# Patient Record
Sex: Female | Born: 1995
Health system: Southern US, Community
[De-identification: ages and names within clinical notes are randomized; demographics above are authoritative.]

## PROBLEM LIST (undated history)

## (undated) ENCOUNTER — Inpatient Hospital Stay (HOSPITAL_COMMUNITY): Payer: Self-pay

## (undated) DIAGNOSIS — A6000 Herpesviral infection of urogenital system, unspecified: Secondary | ICD-10-CM

## (undated) DIAGNOSIS — O09299 Supervision of pregnancy with other poor reproductive or obstetric history, unspecified trimester: Secondary | ICD-10-CM

## (undated) DIAGNOSIS — F1120 Opioid dependence, uncomplicated: Secondary | ICD-10-CM

## (undated) DIAGNOSIS — G8929 Other chronic pain: Secondary | ICD-10-CM

## (undated) DIAGNOSIS — N946 Dysmenorrhea, unspecified: Secondary | ICD-10-CM

## (undated) DIAGNOSIS — O99320 Drug use complicating pregnancy, unspecified trimester: Secondary | ICD-10-CM

## (undated) DIAGNOSIS — F1910 Other psychoactive substance abuse, uncomplicated: Secondary | ICD-10-CM

## (undated) HISTORY — DX: Other chronic pain: G89.29

## (undated) HISTORY — PX: TOOTH EXTRACTION: SUR596

## (undated) HISTORY — DX: Opioid dependence, uncomplicated: F11.20

## (undated) HISTORY — DX: Other psychoactive substance abuse, uncomplicated: F19.10

## (undated) HISTORY — DX: Supervision of pregnancy with other poor reproductive or obstetric history, unspecified trimester: O09.299

## (undated) HISTORY — DX: Drug use complicating pregnancy, unspecified trimester: O99.320

---
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
## 2014-01-05 ENCOUNTER — Emergency Department (HOSPITAL_COMMUNITY)
Admission: EM | Admit: 2014-01-05 | Discharge: 2014-01-05 | Disposition: A | Discharge status: Home / Self Care | Payer: Medicaid Other | Attending: Emergency Medicine | Admitting: Emergency Medicine

## 2014-01-05 ENCOUNTER — Encounter (HOSPITAL_COMMUNITY): Payer: Self-pay | Admitting: Emergency Medicine

## 2014-01-05 DIAGNOSIS — Z79899 Other long term (current) drug therapy: Secondary | ICD-10-CM | POA: Insufficient documentation

## 2014-01-05 DIAGNOSIS — S40022A Contusion of left upper arm, initial encounter: Secondary | ICD-10-CM | POA: Insufficient documentation

## 2014-01-05 DIAGNOSIS — S8012XA Contusion of left lower leg, initial encounter: Secondary | ICD-10-CM | POA: Insufficient documentation

## 2014-01-05 DIAGNOSIS — Y998 Other external cause status: Secondary | ICD-10-CM | POA: Insufficient documentation

## 2014-01-05 DIAGNOSIS — S8011XA Contusion of right lower leg, initial encounter: Secondary | ICD-10-CM | POA: Insufficient documentation

## 2014-01-05 DIAGNOSIS — J45909 Unspecified asthma, uncomplicated: Secondary | ICD-10-CM | POA: Insufficient documentation

## 2014-01-05 DIAGNOSIS — Y9389 Activity, other specified: Secondary | ICD-10-CM | POA: Insufficient documentation

## 2014-01-05 DIAGNOSIS — Y929 Unspecified place or not applicable: Secondary | ICD-10-CM | POA: Insufficient documentation

## 2014-01-05 DIAGNOSIS — Z87448 Personal history of other diseases of urinary system: Secondary | ICD-10-CM | POA: Insufficient documentation

## 2014-01-05 DIAGNOSIS — S40021A Contusion of right upper arm, initial encounter: Secondary | ICD-10-CM | POA: Insufficient documentation
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
## 2014-01-05 NOTE — ED Notes (Signed)
Patient wants to speak with [employer]. [employer] at bedside.

## 2014-01-05 NOTE — Discharge Instructions (Signed)
°Emergency Department Resource Guide °1) Find a Doctor and Pay Out of Pocket °Although you won't have to find out who is covered by your insurance plan, it is a good idea to ask around and get recommendations. You will then need to call the office and see if the doctor you have chosen will accept you as a new patient and what types of options they offer for patients who are self-pay. Some doctors offer discounts or will set up payment plans for their patients who do not have insurance, but you will need to ask so you aren't surprised when you get to your appointment. ° °2) Contact Your Local Health Department °Not all health departments have doctors that can see patients for sick visits, but many do, so it is worth a call to see if yours does. If you don't know where your local health department is, you can check in your phone book. The CDC also has a tool to help you locate your state's health department, and many state websites also have listings of all of their local health departments. ° °3) Find a Walk-in Clinic °If your illness is not likely to be very severe or complicated, you may want to try a walk in clinic. These are popping up all over the country in pharmacies, drugstores, and shopping centers. They're usually staffed by nurse practitioners or physician assistants that have been trained to treat common illnesses and complaints. They're usually fairly quick and inexpensive. However, if you have serious medical issues or chronic medical problems, these are probably not your best option. ° °No Primary Care Doctor: °- Call Health Connect at  832-8000 - they can help you locate a primary care doctor that  accepts your insurance, provides certain services, etc. °- Physician Referral Service- 1-800-533-3463 ° °Chronic Pain Problems: °Organization         Address  Phone   Notes  °[facility]  ([phone]) [phone] Patients need to be referred by their primary care doctor.  ° °Medication  Assistance: °Organization         Address  Phone   Notes  °[place] [facility] [address] °[place], [place] [address] (336) 641-8030 --Must be a resident of [place] °-- Must have NO insurance coverage whatsoever (no Medicaid/ Medicare, etc.) °-- The pt. MUST have a primary care doctor that directs their care regularly and follows them in the community °  °[facility]  (866) 331-1348   °[employer] Way  (888) 892-1162   ° °Agencies that provide inexpensive medical care: °Organization         Address  Phone   Notes  °[facility]  (336) 832-8035   °[facility]    (336) 832-7272   °[facility] [address] °[place], [place] [address] (336) 832-4777   °[facility] [address], °[place] (336) 271-4999   °[facility]    (336) 373-0678   °[facility]    (336) 272-1050   °[facility] ° [address],  Phone:  (336) 832-4444, Fax:  (336) 832-4440 Hours of Operation:  9 am - 6 pm, M-F.  Also accepts Medicaid/Medicare and self-pay.  °[facility] ° [address],  Phone: (336) [phone], Fax: (336) [phone]. Hours of Operation:  8:30 am - 5:30 pm, M-F.  Also accepts Medicaid and self-pay.  °[facility] [place] [address], [place] Phone: (336) 878-6027   °[facility] [address], [place], [place] (336)723-1848, Ext. 123 Mondays & Thursdays: 7-9 AM.  First 15 patients are seen on a first come, first serve basis. °  ° °Medicaid-accepting [place] Providers: ° °Organization         Address  Phone   Notes  °[facility] [address], [place] (336) 641-2100 Also accepts self-pay patients.  °[facility] [address], [place] ° (336) 856-9996   °[facility] [address], [place]  (336) 288-8857   °[facility] [address] [place] [address], [place] (336) 299-7000   °Veita Bland [address], [place]  ° (336) 373-1557 Only accepts [place] Access Medicaid patients after they have their name applied to their card.  ° °Self-Pay (no insurance) in [place]: ° °Organization         Address  Phone   Notes  °Sickle Cell Patients, [facility] [address], [place] (336) 832-1970   °[facility] [address], [place] (336) 832-4400   °[facility] ° [address], [place] (336) 992-4800   °[facility]/Dr. Osei-Bonsu ° [address] [place] [address], [place] or [address], [place] (336) [phone] Phone number for both [place] and [place] locations is the same.  °[facility] [address], [place] (336) 299-0000   °[facility] [address] [place] [address], [place] or [address] (336) 852-7530 °(336) 878-2260   °[facility] [address], [place] (336) 350-1642, phone; (336) 294-5005, fax Sees patients 1st and 3rd Saturday of every month.  Must not qualify for public or private insurance (i.e. Medicaid, Medicare, [place] Health Choice, Veterans' Benefits) • Household income should be no more than 200% of the poverty level •The clinic cannot treat you if you are pregnant or think you are pregnant • Sexually transmitted diseases are not treated at the clinic.  ° ° °Dental Care: °Organization         Address  Phone  Notes  °[place] [facility] [address], [place] (336) 641-6152 Accepts children up to age 21 who are enrolled in Medicaid or [place] Health Choice; pregnant women with a Medicaid card; and children who have applied for Medicaid or [place] Health Choice, but were declined, whose parents can pay a reduced fee at time of service.  °[place]  [facility] [place]  [address], [place] (336) 641-7733 Accepts children up to age 21 who are enrolled in Medicaid or [place] Health Choice; pregnant women with a Medicaid card; and children who have applied for Medicaid or  Health Choice, but were declined, whose parents can pay a reduced fee at time of service.  °[facility] ° [address], [place] (336) 641-4533 Patients are seen by appointment only. Walk-ins are not accepted. [facility] will see patients 18 years of age and older. °Monday - Tuesday (8am-5pm) °Most Wednesdays (8:30-5pm) °$30 per visit, cash only  °[facility] ° [address], [place] (336) 641-4533 Patients are seen by appointment only. Walk-ins are not accepted. [facility] will see patients 18 years of age and older. °One   Wednesday Evening (Monthly: Volunteer Based).  $30 per visit, cash only  °[facility]  (919) 537-3737 for adults; Children under age 4, call Graduate Pediatric Dentistry at ([phone]) [phone]. Children aged 4-14, please call (919) [phone] to request a pediatric application. ° Dental services are provided in all areas of dental care including fillings, crowns and bridges, complete and partial dentures, implants, gum treatment, root canals, and extractions. Preventive care is also provided. Treatment is provided to both adults and children. °Patients are selected via a lottery and there is often a waiting list. °  °[facility] [address], °[place] ° (336) 763-8833 [email or web address] °  °[facility] [address], [place], [place] (336)723-1848, Ext. 123 Second and Fourth Thursday of each month, opens at 6:30 AM; Clinic ends at 9 AM.  Patients are seen on a first-come first-served basis, and a limited number are seen during each clinic.  ° °[facility] ° [address], [place], [place] (336) [phone]    Eligibility Requirements °You must have lived in [place], [place], or [place] counties for at least the last three months. °  You cannot be eligible for state or federal sponsored healthcare insurance, including Veterans Administration, Medicaid, or Medicare. °  You generally cannot be eligible for healthcare insurance through your employer.  °  How to apply: °Eligibility screenings are held every Tuesday and Wednesday afternoon from 1:00 pm until 4:00 pm. You do not need an appointment for the interview!  °[facility] [address], [place], [place] 336-631-2330   °[place] [facility]  336-342-8273   °[place]  336-703-3100   °[facility]  336-570-6415   ° °Behavioral Health Resources in the Community: °Intensive Outpatient Programs °Organization         Address  Phone  Notes  °[place] [facility] [address], [place], [place] 336-878-6098   °[facility] [address], [place], [place] 336-832-9800   °[facility] [address], [place], [place] ° 336-882-2125   °[place] [facility] [address],  °[place], [place] 1-800-853-5163 or 336-641-4981   °Substance Abuse Resources °Organization         Address  Phone  Notes  °[facility]  336-882-2125   °[facility]  336-784-9470   °[facility]  336-285-9073   °[facility]  336-845-3988   °Residential & Outpatient Substance Abuse Program  1-800-659-3381   °Psychological Services °Organization         Address  Phone  Notes  °[facility]  336- 832-9600   °[facility]  336- 378-7881   °[place] [address], [place] 1-800-853-5163 or 336-641-4981   ° °Mobile Crisis Teams °Organization         Address  Phone  Notes  °[facility], Mobile Crisis Care Unit  1-877-626-1772   °Assertive °[facility] ° [address]  [place], [place] 336-834-9664   °Sharon DeEsch [address] °[place] 336-554-5454   ° °Self-Help/Support Groups °Organization         Address  Phone             Notes  °[facility]oc[facility] of  - variety of support groups  336- 373-1402 Call for more information  °Narcotics Anonymous (NA), [facility] [address], °[place]  2 meetings at this location  ° °  Residential Treatment Programs °Organization         Address  Phone  Notes  °[facility] [address],    °[place]  1-866-801-8205   °[facility] ° [address], [place], [place] 704-293-8524   °[facility] [address], [place] 336-845-3988 Admissions: 8am-3pm M-F  °[facility] [address]    °[place], [place] 336-841-1104   °[facility] [address], [place], [place] 336-379-7146   °[facility] [address]  °[place], [place] 336-285-9073   °[facility] - Intensive Outpatient [address], [place], [place] 336-852-3033   °[facility] ([facility] [address]  °[place], [place] 1-877-615-2722 or 336-784-9470   °[facility] ([facility]) [address] [place], [place] 336-227-7417 Accepts Medicaid  °[facility] [address]  °[place] 1-800-659-3381 Substance Abuse/Addiction Treatment  ° °[place] Behavioral Health Resources °Organization         Address  Phone  Notes  °[facility]  (888) 581-9988   °Julie Brannon, PhD [address] [place], [place]   (336) 349-5553 or (336) 951-0000   °[facility]   [address] °[place], [place] (336) 349-4454   °[facility] [address], [place], [place] (336) 342-8316 Insurance/Medicaid/sponsorship through [facility]  °[facility] [address]                                    [place], [place] (336) 342-8316 Therapy/tele-psych/case    °[facility] [address]  ° [place], [place] (336) 349-2233    °Dr. Arfeen  (336) 349-4544   °[facility] [place]  [employer] [place] [facility] 1) [address], [place] °2) [address], [place] °3)  [address], [place] (336) 349-3220 °(336) 342-7768 ° °(336) 342-8140   °[place] Child Abuse Hotline (336) 342-1394 or (336) 342-3537 (After Hours)    ° ° °
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
## 2014-05-09 ENCOUNTER — Encounter (HOSPITAL_COMMUNITY): Payer: Self-pay | Admitting: Emergency Medicine

## 2014-05-09 ENCOUNTER — Emergency Department (HOSPITAL_COMMUNITY)
Admission: EM | Admit: 2014-05-09 | Discharge: 2014-05-09 | Disposition: A | Discharge status: Home / Self Care | Payer: Medicaid Other | Attending: Emergency Medicine | Admitting: Emergency Medicine

## 2014-05-09 DIAGNOSIS — Z79899 Other long term (current) drug therapy: Secondary | ICD-10-CM | POA: Diagnosis not present

## 2014-05-09 DIAGNOSIS — Z8742 Personal history of other diseases of the female genital tract: Secondary | ICD-10-CM | POA: Diagnosis not present

## 2014-05-09 DIAGNOSIS — J45909 Unspecified asthma, uncomplicated: Secondary | ICD-10-CM | POA: Diagnosis not present

## 2014-05-09 DIAGNOSIS — N390 Urinary tract infection, site not specified: Secondary | ICD-10-CM | POA: Insufficient documentation

## 2014-05-09 DIAGNOSIS — Z3202 Encounter for pregnancy test, result negative: Secondary | ICD-10-CM | POA: Insufficient documentation

## 2014-05-09 DIAGNOSIS — B9689 Other specified bacterial agents as the cause of diseases classified elsewhere: Secondary | ICD-10-CM

## 2014-05-09 DIAGNOSIS — N760 Acute vaginitis: Secondary | ICD-10-CM | POA: Insufficient documentation

## 2014-05-09 DIAGNOSIS — R109 Unspecified abdominal pain: Secondary | ICD-10-CM | POA: Diagnosis present

## 2014-05-09 LAB — URINALYSIS, ROUTINE W REFLEX MICROSCOPIC
BILIRUBIN URINE: NEGATIVE
GLUCOSE, UA: NEGATIVE mg/dL
HGB URINE DIPSTICK: NEGATIVE
Ketones, ur: NEGATIVE mg/dL
Nitrite: NEGATIVE
PROTEIN: NEGATIVE mg/dL
Specific Gravity, Urine: 1.026 (ref 1.005–1.030)
Urobilinogen, UA: 0.2 mg/dL (ref 0.0–1.0)
pH: 6 (ref 5.0–8.0)

## 2014-05-09 LAB — URINE MICROSCOPIC-ADD ON

## 2014-05-09 LAB — PREGNANCY, URINE: Preg Test, Ur: NEGATIVE

## 2014-05-09 LAB — WET PREP, GENITAL
TRICH WET PREP: NONE SEEN
YEAST WET PREP: NONE SEEN

## 2014-05-09 MED ORDER — FLUCONAZOLE 150 MG PO TABS: 150.0000 mg | ORAL_TABLET | Freq: Every day | ORAL | Status: AC

## 2014-05-09 MED ORDER — CIPROFLOXACIN HCL 500 MG PO TABS
500.0000 mg | ORAL_TABLET | Freq: Once | ORAL | Status: AC
  Administered 2014-05-09: 500 mg via ORAL
  Filled 2014-05-09: qty 1

## 2014-05-09 MED ORDER — CIPROFLOXACIN HCL 500 MG PO TABS: 500.0000 mg | ORAL_TABLET | Freq: Two times a day (BID) | ORAL | Status: DC

## 2014-05-09 MED ORDER — METRONIDAZOLE 500 MG PO TABS
500.0000 mg | ORAL_TABLET | Freq: Once | ORAL | Status: AC
  Administered 2014-05-09: 500 mg via ORAL
  Filled 2014-05-09: qty 1

## 2014-05-09 MED ORDER — METRONIDAZOLE 500 MG PO TABS: 500.0000 mg | ORAL_TABLET | Freq: Three times a day (TID) | ORAL | Status: DC

## 2014-05-09 NOTE — ED Notes (Signed)
Pt states she has been having right flank pain for several weeks and now is having dysuria and urinary frequency  Pt states she also has an odor coming from "down there" and would like to be tested for STDs

## 2014-05-09 NOTE — ED Provider Notes (Signed)
CSN: [id number]     Arrival date & time 05/09/14  2046 History   First MD Initiated Contact with Patient 05/09/14 2057     Chief Complaint  Patient presents with  . Flank Pain      HPI  This was a evaluation of some right flank pain, and dysuria. Does have the flank pain intermittently over several weeks. Dysuria/2-3 days with urinary frequency. No hematuria. No fevers chills. Also states she "has a smell down there".  Does not use birth control.  Past Medical History  Diagnosis Date  . Asthma   . Dysmenorrhea     Tired OCPs in past X2 but reports worsening of dysmenorrhea and worsening of clots   Past Surgical History  Procedure Laterality Date  . Tooth extraction     Family History  Problem Relation Age of Onset  . Diabetes Other   . Cancer Other    History  Substance Use Topics  . Smoking status: Never Smoker   . Smokeless tobacco: Not on file  . Alcohol Use: No   OB History    No data available     Review of Systems  Constitutional: Negative for fever, chills, diaphoresis, appetite change and fatigue.  HENT: Negative for mouth sores, sore throat and trouble swallowing.   Eyes: Negative for visual disturbance.  Respiratory: Negative for cough, chest tightness, shortness of breath and wheezing.   Cardiovascular: Negative for chest pain.  Gastrointestinal: Negative for nausea, vomiting, abdominal pain, diarrhea and abdominal distention.  Endocrine: Negative for polydipsia, polyphagia and polyuria.  Genitourinary: Positive for dysuria, frequency, flank pain and vaginal discharge. Negative for hematuria.  Musculoskeletal: Negative for gait problem.  Skin: Negative for color change, pallor and rash.  Neurological: Negative for dizziness, syncope, light-headedness and headaches.  Hematological: Does not bruise/bleed easily.  Psychiatric/Behavioral: Negative for behavioral problems and confusion.      Allergies  Review of patient's allergies indicates no known  allergies.  Home Medications   Prior to Admission medications   Medication Sig Start Date End Date Taking? Authorizing Provider  albuterol (PROVENTIL HFA;VENTOLIN HFA) 108 (90 BASE) MCG/ACT inhaler Inhale 3 puffs into the lungs every 4 (four) hours as needed for wheezing. 09/29/11  Yes Marcellina Millinimothy Galey, MD  albuterol (PROVENTIL) (2.5 MG/3ML) 0.083% nebulizer solution Take 2.5 mg by nebulization every 6 (six) hours as needed for wheezing or shortness of breath.    Yes Historical Provider, MD  ibuprofen (ADVIL,MOTRIN) 200 MG tablet Take 600 mg by mouth every 6 (six) hours as needed for moderate pain.   Yes Historical Provider, MD  ciprofloxacin (CIPRO) 500 MG tablet Take 1 tablet (500 mg total) by mouth every 12 (twelve) hours. 05/09/14   Rolland PorterMark Patryk Conant, MD  fluconazole (DIFLUCAN) 150 MG tablet Take 1 tablet (150 mg total) by mouth daily. 05/09/14 05/16/14  Rolland PorterMark Benedetta Sundstrom, MD  metroNIDAZOLE (FLAGYL) 500 MG tablet Take 1 tablet (500 mg total) by mouth 3 (three) times daily. 05/09/14   Rolland PorterMark Darilyn Storbeck, MD  ondansetron (ZOFRAN ODT) 4 MG disintegrating tablet Take 1 tablet (4 mg total) by mouth every 8 (eight) hours as needed for nausea. Patient not taking: Reported on 05/09/2014 06/24/13   Jillyn LedgerJessica K Palmer, PA-C   BP 114/57 mmHg  Pulse 73  Temp(Src) 98.3 F (36.8 C) (Oral)  Resp 16  SpO2 94%  LMP 04/20/2014 (Approximate) Physical Exam  Constitutional: She is oriented to person, place, and time. She appears well-developed and well-nourished. No distress.  HENT:  Head: Normocephalic.  Eyes:  Conjunctivae are normal. Pupils are equal, round, and reactive to light. No scleral icterus.  Neck: Normal range of motion. Neck supple. No thyromegaly present.  Cardiovascular: Normal rate and regular rhythm.  Exam reveals no gallop and no friction rub.   No murmur heard. Pulmonary/Chest: Effort normal and breath sounds normal. No respiratory distress. She has no wheezes. She has no rales.  Abdominal: Soft. Bowel sounds are normal.  She exhibits no distension. There is no tenderness. There is no rebound.  Genitourinary:    Musculoskeletal: Normal range of motion.  Neurological: She is alert and oriented to person, place, and time.  Skin: Skin is warm and dry. No rash noted.  Psychiatric: She has a normal mood and affect. Her behavior is normal.    ED Course  Procedures (including critical care time) Labs Review Labs Reviewed  WET PREP, GENITAL - Abnormal; Notable for the following:    Clue Cells Wet Prep HPF POC FEW (*)    WBC, Wet Prep HPF POC FEW (*)    All other components within normal limits  URINALYSIS, ROUTINE W REFLEX MICROSCOPIC - Abnormal; Notable for the following:    APPearance CLOUDY (*)    Leukocytes, UA MODERATE (*)    All other components within normal limits  URINE MICROSCOPIC-ADD ON - Abnormal; Notable for the following:    Bacteria, UA FEW (*)    All other components within normal limits  PREGNANCY, URINE  GC/CHLAMYDIA PROBE AMP ([place])    Imaging Review No results found.   EKG Interpretation None      MDM   Final diagnoses:  UTI (lower urinary tract infection)  Bacterial vaginosis    Exam and swab suggests bacterial vaginosis. Leukocytes and bacteria in her urine. Plan is Flagyl, Cipro, single dose Diflucan.    Rolland PorterMark Eliyanah Elgersma, MD 05/09/14 2219

## 2014-05-09 NOTE — Discharge Instructions (Signed)
Vaginitis Vaginitis is an inflammation of the vagina. It can happen when the normal bacteria and yeast in the vagina grow too much. There are different types. Treatment will depend on the type you have. HOME CARE  Take all medicines as told by your doctor.  Keep your vagina area clean and dry. Avoid soap. Rinse the area with water.  Avoid washing and cleaning out the vagina (douching).  Do not use tampons or have sex (intercourse) until your treatment is done.  Wipe from front to back after going to the restroom.  Wear cotton underwear.  Avoid wearing underwear while you sleep until your vaginitis is gone.  Avoid tight pants. Avoid underwear or nylons without a cotton panel.  Take off wet clothing (such as a bathing suit) as soon as you can.  Use mild, unscented products. Avoid fabric softeners and scented:  Feminine sprays.  Laundry detergents.  Tampons.  Soaps or bubble baths.  Practice safe sex and use condoms. GET HELP RIGHT AWAY IF:   You have belly (abdominal) pain.  You have a fever or lasting symptoms for more than 2-3 days.  You have a fever and your symptoms suddenly get worse. MAKE SURE YOU:   Understand these instructions.  Will watch this condition.  Will get help right away if you are not doing well or get worse. Document Released: 03/19/2008 Document Revised: 09/15/2011 Document Reviewed: 06/03/2011 [facility] Patient Information 2015 [place], [place]. This information is not intended to replace advice given to you by your health care provider. Make sure you discuss any questions you have with your health care provider.  Bacterial Vaginosis Bacterial vaginosis is a vaginal infection that occurs when the normal balance of bacteria in the vagina is disrupted. It results from an overgrowth of certain bacteria. This is the most common vaginal infection in women of childbearing age. Treatment is important to prevent complications, especially in pregnant  women, as it can cause a premature delivery. CAUSES  Bacterial vaginosis is caused by an increase in harmful bacteria that are normally present in smaller amounts in the vagina. Several different kinds of bacteria can cause bacterial vaginosis. However, the reason that the condition develops is not fully understood. RISK FACTORS Certain activities or behaviors can put you at an increased risk of developing bacterial vaginosis, including:  Having a new sex partner or multiple sex partners.  Douching.  Using an intrauterine device (IUD) for contraception. Women do not get bacterial vaginosis from toilet seats, bedding, swimming pools, or contact with objects around them. SIGNS AND SYMPTOMS  Some women with bacterial vaginosis have no signs or symptoms. Common symptoms include:  Grey vaginal discharge.  A fishlike odor with discharge, especially after sexual intercourse.  Itching or burning of the vagina and vulva.  Burning or pain with urination. DIAGNOSIS  Your health care provider will take a medical history and examine the vagina for signs of bacterial vaginosis. A sample of vaginal fluid may be taken. Your health care provider will look at this sample under a microscope to check for bacteria and abnormal cells. A vaginal pH test may also be done.  TREATMENT  Bacterial vaginosis may be treated with antibiotic medicines. These may be given in the form of a pill or a vaginal cream. A second round of antibiotics may be prescribed if the condition comes back after treatment.  HOME CARE INSTRUCTIONS   Only take over-the-counter or prescription medicines as directed by your health care provider.  If antibiotic medicine was prescribed,  take it as directed. Make sure you finish it even if you start to feel better.  Do not have sex until treatment is completed.  Tell all sexual partners that you have a vaginal infection. They should see their health care provider and be treated if they have  problems, such as a mild rash or itching.  Practice safe sex by using condoms and only having one sex partner. SEEK MEDICAL CARE IF:   Your symptoms are not improving after 3 days of treatment.  You have increased discharge or pain.  You have a fever. MAKE SURE YOU:   Understand these instructions.  Will watch your condition.  Will get help right away if you are not doing well or get worse. FOR MORE INFORMATION  Centers for Disease Control and Prevention, Division of STD Prevention: SolutionApps.co.zawww.cdc.gov/std American Sexual Health Association (ASHA): www.ashastd.org  Document Released: 12/21/2004 Document Revised: 10/11/2012 Document Reviewed: 08/02/2012 [facility] Patient Information 2015 [place], [place]. This information is not intended to replace advice given to you by your health care provider. Make sure you discuss any questions you have with your health care provider.  Urinary Tract Infection Urinary tract infections (UTIs) can develop anywhere along your urinary tract. Your urinary tract is your body's drainage system for removing wastes and extra water. Your urinary tract includes two kidneys, two ureters, a bladder, and a urethra. Your kidneys are a pair of bean-shaped organs. Each kidney is about the size of your fist. They are located below your ribs, one on each side of your spine. CAUSES Infections are caused by microbes, which are microscopic organisms, including fungi, viruses, and bacteria. These organisms are so small that they can only be seen through a microscope. Bacteria are the microbes that most commonly cause UTIs. SYMPTOMS  Symptoms of UTIs may vary by age and gender of the patient and by the location of the infection. Symptoms in young women typically include a frequent and intense urge to urinate and a painful, burning feeling in the bladder or urethra during urination. Older women and men are more likely to be tired, shaky, and weak and have muscle aches and abdominal  pain. A fever may mean the infection is in your kidneys. Other symptoms of a kidney infection include pain in your back or sides below the ribs, nausea, and vomiting. DIAGNOSIS To diagnose a UTI, your caregiver will ask you about your symptoms. Your caregiver also will ask to provide a urine sample. The urine sample will be tested for bacteria and white blood cells. White blood cells are made by your body to help fight infection. TREATMENT  Typically, UTIs can be treated with medication. Because most UTIs are caused by a bacterial infection, they usually can be treated with the use of antibiotics. The choice of antibiotic and length of treatment depend on your symptoms and the type of bacteria causing your infection. HOME CARE INSTRUCTIONS  If you were prescribed antibiotics, take them exactly as your caregiver instructs you. Finish the medication even if you feel better after you have only taken some of the medication.  Drink enough water and fluids to keep your urine clear or pale yellow.  Avoid caffeine, tea, and carbonated beverages. They tend to irritate your bladder.  Empty your bladder often. Avoid holding urine for long periods of time.  Empty your bladder before and after sexual intercourse.  After a bowel movement, women should cleanse from front to back. Use each tissue only once. SEEK MEDICAL CARE IF:  You have back pain.  You develop a fever.  Your symptoms do not begin to resolve within 3 days. SEEK IMMEDIATE MEDICAL CARE IF:   You have severe back pain or lower abdominal pain.  You develop chills.  You have nausea or vomiting.  You have continued burning or discomfort with urination. MAKE SURE YOU:   Understand these instructions.  Will watch your condition.  Will get help right away if you are not doing well or get worse. Document Released: 09/30/2004 Document Revised: 06/22/2011 Document Reviewed: 01/29/2011 [facility] Patient Information 2015 [place],  [place]. This information is not intended to replace advice given to you by your health care provider. Make sure you discuss any questions you have with your health care provider.

## 2014-05-10 LAB — GC/CHLAMYDIA PROBE AMP (~~LOC~~) NOT AT ARMC
Chlamydia: NEGATIVE
Neisseria Gonorrhea: NEGATIVE

## 2014-05-24 ENCOUNTER — Emergency Department (HOSPITAL_COMMUNITY)
Admission: EM | Admit: 2014-05-24 | Discharge: 2014-05-24 | Disposition: A | Discharge status: Home / Self Care | Payer: Medicaid Other | Attending: Emergency Medicine | Admitting: Emergency Medicine

## 2014-05-24 ENCOUNTER — Encounter (HOSPITAL_COMMUNITY): Payer: Self-pay | Admitting: *Deleted

## 2014-05-24 DIAGNOSIS — M2550 Pain in unspecified joint: Secondary | ICD-10-CM | POA: Insufficient documentation

## 2014-05-24 DIAGNOSIS — R112 Nausea with vomiting, unspecified: Secondary | ICD-10-CM | POA: Insufficient documentation

## 2014-05-24 DIAGNOSIS — R197 Diarrhea, unspecified: Secondary | ICD-10-CM | POA: Diagnosis present

## 2014-05-24 DIAGNOSIS — Z79899 Other long term (current) drug therapy: Secondary | ICD-10-CM | POA: Insufficient documentation

## 2014-05-24 DIAGNOSIS — J45909 Unspecified asthma, uncomplicated: Secondary | ICD-10-CM | POA: Diagnosis not present

## 2014-05-24 DIAGNOSIS — Z3202 Encounter for pregnancy test, result negative: Secondary | ICD-10-CM | POA: Insufficient documentation

## 2014-05-24 DIAGNOSIS — R42 Dizziness and giddiness: Secondary | ICD-10-CM | POA: Insufficient documentation

## 2014-05-24 DIAGNOSIS — Z8742 Personal history of other diseases of the female genital tract: Secondary | ICD-10-CM | POA: Diagnosis not present

## 2014-05-24 LAB — URINE MICROSCOPIC-ADD ON

## 2014-05-24 LAB — CBC WITH DIFFERENTIAL/PLATELET
BASOS ABS: 0 10*3/uL (ref 0.0–0.1)
Basophils Relative: 0 % (ref 0–1)
EOS ABS: 0.1 10*3/uL (ref 0.0–0.7)
Eosinophils Relative: 0 % (ref 0–5)
HCT: 42.3 % (ref 36.0–46.0)
HEMOGLOBIN: 14.7 g/dL (ref 12.0–15.0)
LYMPHS ABS: 0.8 10*3/uL (ref 0.7–4.0)
Lymphocytes Relative: 4 % — ABNORMAL LOW (ref 12–46)
MCH: 33 pg (ref 26.0–34.0)
MCHC: 34.8 g/dL (ref 30.0–36.0)
MCV: 95.1 fL (ref 78.0–100.0)
MONO ABS: 1.1 10*3/uL — AB (ref 0.1–1.0)
MONOS PCT: 6 % (ref 3–12)
Neutro Abs: 16.5 10*3/uL — ABNORMAL HIGH (ref 1.7–7.7)
Neutrophils Relative %: 90 % — ABNORMAL HIGH (ref 43–77)
Platelets: 142 10*3/uL — ABNORMAL LOW (ref 150–400)
RBC: 4.45 MIL/uL (ref 3.87–5.11)
RDW: 12.2 % (ref 11.5–15.5)
WBC: 18.4 10*3/uL — ABNORMAL HIGH (ref 4.0–10.5)

## 2014-05-24 LAB — URINALYSIS, ROUTINE W REFLEX MICROSCOPIC
Bilirubin Urine: NEGATIVE
Glucose, UA: NEGATIVE mg/dL
Ketones, ur: 15 mg/dL — AB
Leukocytes, UA: NEGATIVE
NITRITE: NEGATIVE
Protein, ur: 30 mg/dL — AB
Specific Gravity, Urine: 1.037 — ABNORMAL HIGH (ref 1.005–1.030)
Urobilinogen, UA: 0.2 mg/dL (ref 0.0–1.0)
pH: 6 (ref 5.0–8.0)

## 2014-05-24 LAB — COMPREHENSIVE METABOLIC PANEL
ALT: 17 U/L (ref 14–54)
ANION GAP: 10 (ref 5–15)
AST: 18 U/L (ref 15–41)
Albumin: 3.6 g/dL (ref 3.5–5.0)
Alkaline Phosphatase: 54 U/L (ref 38–126)
BUN: 15 mg/dL (ref 6–20)
CALCIUM: 9 mg/dL (ref 8.9–10.3)
CO2: 22 mmol/L (ref 22–32)
Chloride: 101 mmol/L (ref 101–111)
Creatinine, Ser: 0.81 mg/dL (ref 0.44–1.00)
GFR calc Af Amer: >60 mL/min (ref >60)
GFR calc non Af Amer: >60 mL/min (ref >60)
Glucose, Bld: 90 mg/dL (ref 65–99)
Potassium: 3.3 mmol/L — ABNORMAL LOW (ref 3.5–5.1)
Sodium: 133 mmol/L — ABNORMAL LOW (ref 135–145)
Total Bilirubin: 0.8 mg/dL (ref 0.3–1.2)
Total Protein: 6.3 g/dL — ABNORMAL LOW (ref 6.5–8.1)

## 2014-05-24 LAB — POC URINE PREG, ED: Preg Test, Ur: NEGATIVE

## 2014-05-24 LAB — LIPASE, BLOOD: Lipase: 27 U/L (ref 22–51)

## 2014-05-24 MED ORDER — ONDANSETRON 4 MG PO TBDP
4.0000 mg | ORAL_TABLET | Freq: Once | ORAL | Status: AC
  Administered 2014-05-24: 4 mg via ORAL
  Filled 2014-05-24: qty 1

## 2014-05-24 MED ORDER — ACETAMINOPHEN 325 MG PO TABS
650.0000 mg | ORAL_TABLET | Freq: Once | ORAL | Status: AC
  Administered 2014-05-24: 650 mg via ORAL
  Filled 2014-05-24: qty 2

## 2014-05-24 MED ORDER — ONDANSETRON 4 MG PO TBDP: 4.0000 mg | ORAL_TABLET | Freq: Three times a day (TID) | ORAL | Status: DC | PRN

## 2014-05-24 NOTE — Discharge Instructions (Signed)

## 2014-05-24 NOTE — ED Notes (Signed)
Pt able to keep down ginger ale after taking zofran without difficulty

## 2014-05-24 NOTE — ED Provider Notes (Signed)
CSN: [id number]     Arrival date & time 05/24/14  1638 History   First MD Initiated Contact with Patient 05/24/14 1858     Chief Complaint  Patient presents with  . Emesis  . Diarrhea   Beth Carr is 19 y.o. female who presents the emergency department complaining of nausea, vomiting and diarrhea for the past 3 days. She also reports associated body aches. The patient reports she had one episode of vomiting earlier this morning and one episode of diarrhea this morning as well. She reports she has been eating and drinking normally since her episodes of vomiting and diarrhea this morning and currently denies any nausea. She reports that last night she stood up to use the bathroom and felt lightheaded and passed out on the couch. She denies hitting her head. She reports her symptoms resolved after lying on the couch for a few moments. The patient reports she has eaten Jacqual[employer] Mau[employer] views prior to coming to the emergency department and has been drinking fluids in the ED. The patient has not vomited since eating today. The patient denies fevers, chills, abdominal pain, hematemesis, hematochezia, vaginal bleeding, vaginal discharge, urinary symptoms, hematuria, lightheadedness today, headaches, sick contacts, neck pain or rashes.   (Consider location/radiation/quality/duration/timing/severity/associated sxs/prior Treatment) HPI  Past Medical History  Diagnosis Date  . Asthma   . Dysmenorrhea     Tired OCPs in past X2 but reports worsening of dysmenorrhea and worsening of clots   Past Surgical History  Procedure Laterality Date  . Tooth extraction     Family History  Problem Relation Age of Onset  . Diabetes Other   . Cancer Other    History  Substance Use Topics  . Smoking status: Never Smoker   . Smokeless tobacco: Not on file  . Alcohol Use: No   OB History    No data available     Review of Systems  Constitutional: Negative for fever and chills.  HENT: Negative for congestion, ear  pain, sore throat and trouble swallowing.   Eyes: Negative for pain and visual disturbance.  Respiratory: Negative for cough, shortness of breath and wheezing.   Cardiovascular: Negative for chest pain and palpitations.  Gastrointestinal: Positive for nausea, vomiting and diarrhea. Negative for abdominal pain and blood in stool.  Genitourinary: Negative for dysuria, urgency, frequency, hematuria, flank pain, vaginal bleeding, vaginal discharge, difficulty urinating and genital sores.  Musculoskeletal: Positive for arthralgias. Negative for back pain, neck pain and neck stiffness.  Skin: Negative for rash.  Neurological: Positive for light-headedness. Negative for dizziness, weakness, numbness and headaches.      Allergies  Review of patient's allergies indicates no known allergies.  Home Medications   Prior to Admission medications   Medication Sig Start Date End Date Taking? Authorizing Provider  albuterol (PROVENTIL HFA;VENTOLIN HFA) 108 (90 BASE) MCG/ACT inhaler Inhale 3 puffs into the lungs every 4 (four) hours as needed for wheezing. 09/29/11  Yes Marcellina Millinimothy Galey, MD  albuterol (PROVENTIL) (2.5 MG/3ML) 0.083% nebulizer solution Take 2.5 mg by nebulization every 6 (six) hours as needed for wheezing or shortness of breath.    Yes Historical Provider, MD  ibuprofen (ADVIL,MOTRIN) 200 MG tablet Take 600 mg by mouth every 6 (six) hours as needed for moderate pain.   Yes Historical Provider, MD  ciprofloxacin (CIPRO) 500 MG tablet Take 1 tablet (500 mg total) by mouth every 12 (twelve) hours. Patient not taking: Reported on 05/24/2014 05/09/14   Rolland PorterMark James, MD  metroNIDAZOLE (FLAGYL) 500 MG  tablet Take 1 tablet (500 mg total) by mouth 3 (three) times daily. Patient not taking: Reported on 05/24/2014 05/09/14   Rolland Porter, MD  ondansetron (ZOFRAN ODT) 4 MG disintegrating tablet Take 1 tablet (4 mg total) by mouth every 8 (eight) hours as needed for nausea or vomiting. 05/24/14   Everlene Farrier, PA-C    BP 109/60 mmHg  Pulse 75  Temp(Src) 98.4 F (36.9 C) (Oral)  Resp 20  Ht  (1.6 m)  Wt 110 lb (49.896 kg)  BMI 19.49 kg/m2  SpO2 99%  LMP 05/13/2014 Physical Exam  Constitutional: She is oriented to person, place, and time. She appears well-developed and well-nourished. No distress.  Nontoxic appearing.  HENT:  Head: Normocephalic and atraumatic.  Right Ear: External ear normal.  Left Ear: External ear normal.  Nose: Nose normal.  Mouth/Throat: Oropharynx is clear and moist. No oropharyngeal exudate.  Eyes: Conjunctivae are normal. Pupils are equal, round, and reactive to light. Right eye exhibits no discharge. Left eye exhibits no discharge.  Neck: Normal range of motion. Neck supple. No JVD present. No tracheal deviation present.  Cardiovascular: Normal rate, regular rhythm, normal heart sounds and intact distal pulses.  Exam reveals no gallop and no friction rub.   No murmur heard. Pulmonary/Chest: Effort normal and breath sounds normal. No respiratory distress. She has no wheezes. She has no rales.  Abdominal: Soft. Bowel sounds are normal. She exhibits no distension and no mass. There is no tenderness. There is no rebound and no guarding.  Abdomen is soft and nontender to palpation. Bowel sounds are present. No right lower quadrant tenderness. No rebound tenderness.  Musculoskeletal: She exhibits no edema or tenderness.  Lymphadenopathy:    She has no cervical adenopathy.  Neurological: She is alert and oriented to person, place, and time. Coordination normal.  Skin: Skin is warm and dry. No rash noted. She is not diaphoretic. No erythema. No pallor.  Psychiatric: She has a normal mood and affect. Her behavior is normal.  Nursing note and vitals reviewed.   ED Course  Procedures (including critical care time) Labs Review Labs Reviewed  CBC WITH DIFFERENTIAL/PLATELET - Abnormal; Notable for the following:    WBC 18.4 (*)    Platelets 142 (*)    Neutrophils  Relative % 90 (*)    Neutro Abs 16.5 (*)    Lymphocytes Relative 4 (*)    Monocytes Absolute 1.1 (*)    All other components within normal limits  COMPREHENSIVE METABOLIC PANEL - Abnormal; Notable for the following:    Sodium 133 (*)    Potassium 3.3 (*)    Total Protein 6.3 (*)    All other components within normal limits  URINALYSIS, ROUTINE W REFLEX MICROSCOPIC - Abnormal; Notable for the following:    Color, Urine AMBER (*)    APPearance CLOUDY (*)    Specific Gravity, Urine 1.037 (*)    Hgb urine dipstick TRACE (*)    Ketones, ur 15 (*)    Protein, ur 30 (*)    All other components within normal limits  URINE MICROSCOPIC-ADD ON - Abnormal; Notable for the following:    Squamous Epithelial / LPF MANY (*)    Bacteria, UA FEW (*)    Casts HYALINE CASTS (*)    All other components within normal limits  LIPASE, BLOOD  POC URINE PREG, ED    Imaging Review No results found.   EKG Interpretation None      Filed Vitals:   05/24/14  1828 05/24/14 1900 05/24/14 2033 05/24/14 2100  BP: 112/59 113/66 78/61 109/60  Pulse: 105 87 88 75  Temp:      TempSrc:      Resp: 20  20   Height:      Weight:      SpO2: 100% 100% 99% 99%     MDM   Meds given in ED:  Medications  ondansetron (ZOFRAN-ODT) disintegrating tablet 4 mg (4 mg Oral Given 05/24/14 2001)  acetaminophen (TYLENOL) tablet 650 mg (650 mg Oral Given 05/24/14 2001)    New Prescriptions   ONDANSETRON (ZOFRAN ODT) 4 MG DISINTEGRATING TABLET    Take 1 tablet (4 mg total) by mouth every 8 (eight) hours as needed for nausea or vomiting.    Final diagnoses:  Nausea vomiting and diarrhea   This is a 19 year old female who presented to the emergency department complaining of nausea, vomiting and diarrhea for the past 2 days associated with generalized body aches. She reports she last had an episode of nausea and vomiting this morning. She reports less than a loose stool this morning. She reports she has been eating and  drinking normally this afternoon and has not had any vomiting or diarrhea. On exam the patient is afebrile and nontoxic appearing. Her abdomen is soft and nontender to palpation. She has no peritoneal signs. She is drinking fluids in the ED. She reports that she is feeling better currently. Patient provided with Zofran and Tylenol for her body aches. Patient tolerated ginger ale and crackers in the emergency department without nausea or vomiting. She has a negative urine pregnancy test. Her urinalysis is nitrite and leukocyte negative. She denies any urinary symptoms. Patient does have a mild acidosis with a white count of 18.4. Her CMP is unremarkable. Her lipase is within normal limits. At reevaluation the patient reports feeling much better and ready to discharge. She is tolerated by mouth liquids in the DP. Her abdomen is still soft and nontender. Will discharge with prescriptions for Zofran and advised to follow-up with her PCP. Strict return precautions provided. I advised the patient to follow-up with their primary care provider this week. I advised the patient to return to the emergency department with new or worsening symptoms or new concerns. The patient verbalized understanding and agreement with plan.       Everlene FarrierWilliam Dail Lerew, PA-C 05/24/14 2135  Doug SouSam Jacubowitz, MD 05/25/14 [phone]

## 2014-05-24 NOTE — ED Notes (Signed)
Pt reports having n/v/d x 3 days. Having bodyaches and fatigue, reports syncopal episode last night. No acute distress noted at triage.

## 2014-10-25 ENCOUNTER — Encounter (HOSPITAL_COMMUNITY): Payer: Self-pay | Admitting: Emergency Medicine

## 2014-10-25 ENCOUNTER — Emergency Department (HOSPITAL_COMMUNITY)
Admission: EM | Admit: 2014-10-25 | Discharge: 2014-10-25 | Disposition: A | Discharge status: Home / Self Care | Payer: Medicaid Other | Attending: Emergency Medicine | Admitting: Emergency Medicine

## 2014-10-25 DIAGNOSIS — R102 Pelvic and perineal pain: Secondary | ICD-10-CM | POA: Diagnosis present

## 2014-10-25 DIAGNOSIS — B373 Candidiasis of vulva and vagina: Secondary | ICD-10-CM | POA: Insufficient documentation

## 2014-10-25 DIAGNOSIS — J45909 Unspecified asthma, uncomplicated: Secondary | ICD-10-CM | POA: Diagnosis not present

## 2014-10-25 DIAGNOSIS — Z79899 Other long term (current) drug therapy: Secondary | ICD-10-CM | POA: Insufficient documentation

## 2014-10-25 DIAGNOSIS — B3731 Acute candidiasis of vulva and vagina: Secondary | ICD-10-CM

## 2014-10-25 DIAGNOSIS — Z3202 Encounter for pregnancy test, result negative: Secondary | ICD-10-CM | POA: Insufficient documentation

## 2014-10-25 DIAGNOSIS — Z202 Contact with and (suspected) exposure to infections with a predominantly sexual mode of transmission: Secondary | ICD-10-CM | POA: Insufficient documentation

## 2014-10-25 DIAGNOSIS — K644 Residual hemorrhoidal skin tags: Secondary | ICD-10-CM | POA: Diagnosis not present

## 2014-10-25 DIAGNOSIS — Z8742 Personal history of other diseases of the female genital tract: Secondary | ICD-10-CM | POA: Diagnosis not present

## 2014-10-25 DIAGNOSIS — Z711 Person with feared health complaint in whom no diagnosis is made: Secondary | ICD-10-CM

## 2014-10-25 LAB — URINE MICROSCOPIC-ADD ON

## 2014-10-25 LAB — URINALYSIS, ROUTINE W REFLEX MICROSCOPIC
Bilirubin Urine: NEGATIVE
GLUCOSE, UA: NEGATIVE mg/dL
Hgb urine dipstick: NEGATIVE
Ketones, ur: NEGATIVE mg/dL
NITRITE: NEGATIVE
PROTEIN: NEGATIVE mg/dL
Specific Gravity, Urine: 1.024 (ref 1.005–1.030)
Urobilinogen, UA: 1 mg/dL (ref 0.0–1.0)
pH: 7.5 (ref 5.0–8.0)

## 2014-10-25 LAB — POC URINE PREG, ED: PREG TEST UR: NEGATIVE

## 2014-10-25 LAB — WET PREP, GENITAL
Clue Cells Wet Prep HPF POC: NONE SEEN
TRICH WET PREP: NONE SEEN

## 2014-10-25 MED ORDER — FLUCONAZOLE 150 MG PO TABS
150.0000 mg | ORAL_TABLET | Freq: Once | ORAL | Status: DC
  Filled 2014-10-25: qty 1

## 2014-10-25 MED ORDER — LIDOCAINE HCL 1 % IJ SOLN
INTRAMUSCULAR | Status: AC
  Administered 2014-10-25: 20 mL
  Filled 2014-10-25: qty 20

## 2014-10-25 MED ORDER — AZITHROMYCIN 250 MG PO TABS
1000.0000 mg | ORAL_TABLET | Freq: Once | ORAL | Status: AC
  Administered 2014-10-25: 1000 mg via ORAL
  Filled 2014-10-25: qty 4

## 2014-10-25 MED ORDER — FLUCONAZOLE 150 MG PO TABS: 150.0000 mg | ORAL_TABLET | Freq: Once | ORAL | Status: DC

## 2014-10-25 MED ORDER — CEFTRIAXONE SODIUM 250 MG IJ SOLR
250.0000 mg | Freq: Once | INTRAMUSCULAR | Status: AC
  Administered 2014-10-25: 250 mg via INTRAMUSCULAR
  Filled 2014-10-25: qty 250

## 2014-10-25 NOTE — ED Notes (Signed)
Pt left before receiving PO diflucan and d/c instructions.

## 2014-10-25 NOTE — Progress Notes (Signed)
19 yr old female from [place]([place]) Pt states address in [place] is her "permanent address but I moved to [place] about a year ago"  "address is for the person who adopted me"   Pt given [place] uninsured and [place] resources Pt is not eligible for [facility] because of her preference to keep [place] as permanent address Encouraged use of goodrx to help with medication choices/cost   CM discussed and provided written information for uninsured accepting pcps, discussed the importance of pcp vs EDP services for f/u care, www.needymeds.org, www.goodrx.com, discounted pharmacies and other [place] resources such as [facility] , [facility], affordable care act, financial assistance, uninsured dental services,  med assist, DSS and  health department  Reviewed resources for [place] uninsured accepting pcps like [facility], family medicine [facility] [address], [facility], [facility], local urgent care centers, [facility], [facility], general medical clinics, [facility] services of the [facility], [facility] urgent care plus others, medication resources, [facility] out patient [facility]ies and housing Pt voiced understanding and appreciation of resources provided

## 2014-10-25 NOTE — ED Notes (Addendum)
Patient c/o bilateral low abd/pelvic pain x 1 week. Denies N/V/D. Last BM 10/24/2014. Denies difficulty with voiding. Patient thinks she may have a yeast infection, states she has itching and burning after urination.

## 2014-10-25 NOTE — ED Notes (Signed)
Pt and patients belongings not in room.

## 2014-10-25 NOTE — Discharge Instructions (Signed)
Take diflucan in 48 hours. Follow up with gynecology.  Vaginitis Vaginitis is an inflammation of the vagina. It is most often caused by a change in the normal balance of the bacteria and yeast that live in the vagina. This change in balance causes an overgrowth of certain bacteria or yeast, which causes the inflammation. There are different types of vaginitis, but the most common types are:  Bacterial vaginosis.  Yeast infection (candidiasis).  Trichomoniasis vaginitis. This is a sexually transmitted infection (STI).  Viral vaginitis.  Atrophic vaginitis.  Allergic vaginitis. CAUSES  The cause depends on the type of vaginitis. Vaginitis can be caused by:  Bacteria (bacterial vaginosis).  Yeast (yeast infection).  A parasite (trichomoniasis vaginitis)  A virus (viral vaginitis).  Low hormone levels (atrophic vaginitis). Low hormone levels can occur during pregnancy, breastfeeding, or after menopause.  Irritants, such as bubble baths, scented tampons, and feminine sprays (allergic vaginitis). Other factors can change the normal balance of the yeast and bacteria that live in the vagina. These include:  Antibiotic medicines.  Poor hygiene.  Diaphragms, vaginal sponges, spermicides, birth control pills, and intrauterine devices (IUD).  Sexual intercourse.  Infection.  Uncontrolled diabetes.  A weakened immune system. SYMPTOMS  Symptoms can vary depending on the cause of the vaginitis. Common symptoms include:  Abnormal vaginal discharge.  The discharge is white, gray, or yellow with bacterial vaginosis.  The discharge is thick, white, and cheesy with a yeast infection.  The discharge is frothy and yellow or greenish with trichomoniasis.  A bad vaginal odor.  The odor is fishy with bacterial vaginosis.  Vaginal itching, pain, or swelling.  Painful intercourse.  Pain or burning when urinating. Sometimes, there are no symptoms. TREATMENT  Treatment will vary  depending on the type of infection.   Bacterial vaginosis and trichomoniasis are often treated with antibiotic creams or pills.  Yeast infections are often treated with antifungal medicines, such as vaginal creams or suppositories.  Viral vaginitis has no cure, but symptoms can be treated with medicines that relieve discomfort. Your sexual partner should be treated as well.  Atrophic vaginitis may be treated with an estrogen cream, pill, suppository, or vaginal ring. If vaginal dryness occurs, lubricants and moisturizing creams may help. You may be told to avoid scented soaps, sprays, or douches.  Allergic vaginitis treatment involves quitting the use of the product that is causing the problem. Vaginal creams can be used to treat the symptoms. HOME CARE INSTRUCTIONS   Take all medicines as directed by your caregiver.  Keep your genital area clean and dry. Avoid soap and only rinse the area with water.  Avoid douching. It can remove the healthy bacteria in the vagina.  Do not use tampons or have sexual intercourse until your vaginitis has been treated. Use sanitary pads while you have vaginitis.  Wipe from front to back. This avoids the spread of bacteria from the rectum to the vagina.  Let air reach your genital area.  Wear cotton underwear to decrease moisture buildup.  Avoid wearing underwear while you sleep until your vaginitis is gone.  Avoid tight pants and underwear or nylons without a cotton panel.  Take off wet clothing (especially bathing suits) as soon as possible.  Use mild, non-scented products. Avoid using irritants, such as:  Scented feminine sprays.  Fabric softeners.  Scented detergents.  Scented tampons.  Scented soaps or bubble baths.  Practice safe sex and use condoms. Condoms may prevent the spread of trichomoniasis and viral vaginitis. SEEK MEDICAL  CARE IF:   You have abdominal pain.  You have a fever or persistent symptoms for more than 2-3  days.  You have a fever and your symptoms suddenly get worse.   This information is not intended to replace advice given to you by your health care provider. Make sure you discuss any questions you have with your health care provider.   Document Released: 10/18/2006 Document Revised: 05/07/2014 Document Reviewed: 06/03/2011 Elsevier Interactive Patient Education 2016 [employer]  Sexually Transmitted Disease A sexually transmitted disease (STD) is a disease or infection that may be passed (transmitted) from person to person, usually during sexual activity. This may happen by way of saliva, semen, blood, vaginal mucus, or urine. Common STDs include:  Gonorrhea.  Chlamydia.  Syphilis.  HIV and AIDS.  Genital herpes.  Hepatitis B and C.  Trichomonas.  Human papillomavirus (HPV).  Pubic lice.  Scabies.  Mites.  Bacterial vaginosis. WHAT ARE CAUSES OF STDs? An STD may be caused by bacteria, a virus, or parasites. STDs are often transmitted during sexual activity if one person is infected. However, they may also be transmitted through nonsexual means. STDs may be transmitted after:   Sexual intercourse with an infected person.  Sharing sex toys with an infected person.  Sharing needles with an infected person or using unclean piercing or tattoo needles.  Having intimate contact with the genitals, mouth, or rectal areas of an infected person.  Exposure to infected fluids during birth. WHAT ARE THE SIGNS AND SYMPTOMS OF STDs? Different STDs have different symptoms. Some people may not have any symptoms. If symptoms are present, they may include:  Painful or bloody urination.  Pain in the pelvis, abdomen, vagina, anus, throat, or eyes.  A skin rash, itching, or irritation.  Growths, ulcerations, blisters, or sores in the genital and anal areas.  Abnormal vaginal discharge with or without bad odor.  Penile discharge in men.  Fever.  Pain or bleeding during  sexual intercourse.  Swollen glands in the groin area.  Yellow skin and eyes (jaundice). This is seen with hepatitis.  Swollen testicles.  Infertility.  Sores and blisters in the mouth. HOW ARE STDs DIAGNOSED? To make a diagnosis, your health care provider may:  Take a medical history.  Perform a physical exam.  Take a sample of any discharge to examine.  Swab the throat, cervix, opening to the penis, rectum, or vagina for testing.  Test a sample of your first morning urine.  Perform blood tests.  Perform a Pap test, if this applies.  Perform a colposcopy.  Perform a laparoscopy. HOW ARE STDs TREATED? Treatment depends on the STD. Some STDs may be treated but not cured.  Chlamydia, gonorrhea, trichomonas, and syphilis can be cured with antibiotic medicine.  Genital herpes, hepatitis, and HIV can be treated, but not cured, with prescribed medicines. The medicines lessen symptoms.  Genital warts from HPV can be treated with medicine or by freezing, burning (electrocautery), or surgery. Warts may come back.  HPV cannot be cured with medicine or surgery. However, abnormal areas may be removed from the cervix, vagina, or vulva.  If your diagnosis is confirmed, your recent sexual partners need treatment. This is true even if they are symptom-free or have a negative culture or evaluation. They should not have sex until their health care providers say it is okay.  Your health care provider may test you for infection again 3 months after treatment. HOW CAN I REDUCE MY RISK OF GETTING AN STD?  Take these steps to reduce your risk of getting an STD:  Use latex condoms, dental dams, and water-soluble lubricants during sexual activity. Do not use petroleum jelly or oils.  Avoid having multiple sex partners.  Do not have sex with someone who has other sex partners  Do not have sex with anyone you do not know or who is at high risk for an STD.  Avoid risky sex practices that  can break your skin.  Do not have sex if you have open sores on your mouth or skin.  Avoid drinking too much alcohol or taking illegal drugs. Alcohol and drugs can affect your judgment and put you in a vulnerable position.  Avoid engaging in oral and anal sex acts.  Get vaccinated for HPV and hepatitis. If you have not received these vaccines in the past, talk to your health care provider about whether one or both might be right for you.  If you are at risk of being infected with HIV, it is recommended that you take a prescription medicine daily to prevent HIV infection. This is called pre-exposure prophylaxis (PrEP). You are considered at risk if:  You are a man who has sex with other men (MSM).  You are a heterosexual man or woman and are sexually active with more than one partner.  You take drugs by injection.  You are sexually active with a partner who has HIV.  Talk with your health care provider about whether you are at high risk of being infected with HIV. If you choose to begin PrEP, you should first be tested for HIV. You should then be tested every 3 months for as long as you are taking PrEP. WHAT SHOULD I DO IF I THINK I HAVE AN STD?  See your health care provider.  Tell your sexual partner(s). They should be tested and treated for any STDs.  Do not have sex until your health care provider says it is okay. WHEN SHOULD I GET IMMEDIATE MEDICAL CARE? Contact your health care provider right away if:   You have severe abdominal pain.  You are a man and notice swelling or pain in your testicles.  You are a woman and notice swelling or pain in your vagina.   This information is not intended to replace advice given to you by your health care provider. Make sure you discuss any questions you have with your health care provider.   Document Released: 03/13/2002 Document Revised: 01/11/2014 Document Reviewed: 07/11/2012 Elsevier Interactive Patient Education [employer].

## 2014-10-25 NOTE — ED Provider Notes (Signed)
CSN: [id number]     Arrival date & time 10/25/14  [id number] History   First MD Initiated Contact with Patient 10/25/14 1007     Chief Complaint  Patient presents with  . Pelvic Pain     (Consider location/radiation/quality/duration/timing/severity/associated sxs/prior Treatment) HPI Comments: 19 year old female presenting with vaginal itching 1 week. Reports history of multiple yeast infections due to her "abnormal formed vagina where my urethra sticks out" and after menses, and this feels similar. She noticed a skin tag lesion on her vagina that is not painful. Reports a small amount of white vaginal discharge. Has vaginal burning after urination. LMP 10/16/14. Sexually active with her boyfriend who was cheating on her and does not use protection. She is concerned about sexually transmitted diseases. Yesterday she started to develop lower abdominal and pelvic pain that is cramping in nature. Denies fever, chills, nausea or vomiting.  Patient is a 19 y.o. female presenting with vaginal itching. The history is provided by the patient and a relative.  Vaginal Itching This is a recurrent problem. The current episode started in the past 7 days. The problem occurs constantly. The problem has been gradually worsening. Associated symptoms include abdominal pain. Exacerbated by: urinating. Treatments tried: monistat. The treatment provided no relief.    Past Medical History  Diagnosis Date  . Asthma   . Dysmenorrhea     Tired OCPs in past X2 but reports worsening of dysmenorrhea and worsening of clots   Past Surgical History  Procedure Laterality Date  . Tooth extraction     Family History  Problem Relation Age of Onset  . Diabetes Other   . Cancer Other    Social History  Substance Use Topics  . Smoking status: Never Smoker   . Smokeless tobacco: None  . Alcohol Use: No   OB History    No data available     Review of Systems  Gastrointestinal: Positive for abdominal pain.   Genitourinary: Positive for vaginal discharge and pelvic pain.       +Vaginal itching.  All other systems reviewed and are negative.     Allergies  Review of patient's allergies indicates no known allergies.  Home Medications   Prior to Admission medications   Medication Sig Start Date End Date Taking? Authorizing Provider  albuterol (PROVENTIL HFA;VENTOLIN HFA) 108 (90 BASE) MCG/ACT inhaler Inhale 3 puffs into the lungs every 4 (four) hours as needed for wheezing. 09/29/11  Yes Marcellina Millinimothy Galey, MD  albuterol (PROVENTIL) (2.5 MG/3ML) 0.083% nebulizer solution Take 2.5 mg by nebulization every 6 (six) hours as needed for wheezing or shortness of breath.    Yes Historical Provider, MD  ibuprofen (ADVIL,MOTRIN) 200 MG tablet Take 600 mg by mouth every 6 (six) hours as needed for moderate pain.   Yes Historical Provider, MD  ondansetron (ZOFRAN ODT) 4 MG disintegrating tablet Take 1 tablet (4 mg total) by mouth every 8 (eight) hours as needed for nausea or vomiting. 05/24/14  Yes Everlene FarrierWilliam Dansie, PA-C  ciprofloxacin (CIPRO) 500 MG tablet Take 1 tablet (500 mg total) by mouth every 12 (twelve) hours. Patient not taking: Reported on 05/24/2014 05/09/14   Rolland PorterMark James, MD  fluconazole (DIFLUCAN) 150 MG tablet Take 1 tablet (150 mg total) by mouth once. 10/25/14   Shar Paez M Laylonie Marzec, PA-C  metroNIDAZOLE (FLAGYL) 500 MG tablet Take 1 tablet (500 mg total) by mouth 3 (three) times daily. Patient not taking: Reported on 05/24/2014 05/09/14   Rolland PorterMark James, MD   BP 139/86 mmHg  Pulse 73  Temp(Src) 98 F (36.7 C) (Oral)  Resp 14  SpO2 100%  LMP 10/16/2014 (Approximate) Physical Exam  Constitutional: She is oriented to person, place, and time. She appears well-developed and well-nourished. No distress.  HENT:  Head: Normocephalic and atraumatic.  Mouth/Throat: Oropharynx is clear and moist.  Eyes: Conjunctivae and EOM are normal.  Neck: Normal range of motion. Neck supple.  Cardiovascular: Normal rate, regular  rhythm and normal heart sounds.   Pulmonary/Chest: Effort normal and breath sounds normal. No respiratory distress.  Genitourinary: Rectal exam shows external hemorrhoid (small, no thrombosis or bleeding, no tenderness). There is no lesion on the right labia. There is no lesion on the left labia. Uterus is tender (mild "fullness" feeling). Cervix exhibits no motion tenderness, no discharge and no friability. Right adnexum displays no mass, no tenderness and no fullness. Left adnexum displays no mass, no tenderness and no fullness. Vaginal discharge (white, clumpy) found.  No external or internal vaginal lesions noted.  Musculoskeletal: Normal range of motion. She exhibits no edema.  Neurological: She is alert and oriented to person, place, and time. No sensory deficit.  Skin: Skin is warm and dry.  Psychiatric: She has a normal mood and affect. Her behavior is normal.  Nursing note and vitals reviewed.   ED Course  Procedures (including critical care time) Labs Review Labs Reviewed  WET PREP, GENITAL - Abnormal; Notable for the following:    Yeast Wet Prep HPF POC FEW (*)    WBC, Wet Prep HPF POC FEW (*)    All other components within normal limits  URINALYSIS, ROUTINE W REFLEX MICROSCOPIC (NOT AT [facility]) - Abnormal; Notable for the following:    APPearance CLOUDY (*)    Leukocytes, UA SMALL (*)    All other components within normal limits  URINE MICROSCOPIC-ADD ON - Abnormal; Notable for the following:    Bacteria, UA FEW (*)    Crystals CA OXALATE CRYSTALS (*)    All other components within normal limits  RPR  HIV ANTIBODY (ROUTINE TESTING)  POC URINE PREG, ED  GC/CHLAMYDIA PROBE AMP ([place]) NOT AT [facility]    Imaging Review No results found. I have personally reviewed and evaluated these images and lab results as part of my medical decision-making.   EKG Interpretation None      MDM   Final diagnoses:  Yeast vaginitis  Concern about STD in female without diagnosis    Non-toxic appearing, NAD. AFVSS. Abdomen soft with no peritoneal signs. Mild tenderness "fullness" to uterus. No CMT or adnexal tenderness. No cervical discharge or friability. Doubt PID. Prophylactically treated with Rocephin and azithromycin. Diflucan tablet given here and another one at discharge for 48 hours. No lesions noted. Discussed safe sexual practices advised her to follow-up with gynecology. Resources given. Stable for discharge. Return precautions given. Pt/family/caregiver aware medical decision making process and agreeable with plan.  Kathrynn Speed, PA-C 10/25/14 1151  Lyndal Pulley, MD 10/26/14 [phone]

## 2014-10-26 LAB — HIV ANTIBODY (ROUTINE TESTING W REFLEX): HIV Screen 4th Generation wRfx: NONREACTIVE

## 2014-10-26 LAB — RPR: RPR Ser Ql: NONREACTIVE

## 2014-10-28 LAB — GC/CHLAMYDIA PROBE AMP (~~LOC~~) NOT AT ARMC
Chlamydia: NEGATIVE
Neisseria Gonorrhea: NEGATIVE

## 2014-12-01 ENCOUNTER — Encounter (HOSPITAL_COMMUNITY): Payer: Self-pay | Admitting: Emergency Medicine

## 2014-12-01 ENCOUNTER — Emergency Department (HOSPITAL_COMMUNITY)
Admission: EM | Admit: 2014-12-01 | Discharge: 2014-12-01 | Disposition: A | Discharge status: Home / Self Care | Payer: Medicaid Other | Attending: Emergency Medicine | Admitting: Emergency Medicine

## 2014-12-01 DIAGNOSIS — R300 Dysuria: Secondary | ICD-10-CM | POA: Insufficient documentation

## 2014-12-01 DIAGNOSIS — J45909 Unspecified asthma, uncomplicated: Secondary | ICD-10-CM | POA: Diagnosis not present

## 2014-12-01 DIAGNOSIS — Z79899 Other long term (current) drug therapy: Secondary | ICD-10-CM | POA: Diagnosis not present

## 2014-12-01 DIAGNOSIS — Z202 Contact with and (suspected) exposure to infections with a predominantly sexual mode of transmission: Secondary | ICD-10-CM | POA: Diagnosis not present

## 2014-12-01 DIAGNOSIS — R102 Pelvic and perineal pain: Secondary | ICD-10-CM | POA: Diagnosis present

## 2014-12-01 DIAGNOSIS — Z3202 Encounter for pregnancy test, result negative: Secondary | ICD-10-CM | POA: Insufficient documentation

## 2014-12-01 DIAGNOSIS — Z8742 Personal history of other diseases of the female genital tract: Secondary | ICD-10-CM | POA: Insufficient documentation

## 2014-12-01 DIAGNOSIS — Z711 Person with feared health complaint in whom no diagnosis is made: Secondary | ICD-10-CM

## 2014-12-01 LAB — URINALYSIS, ROUTINE W REFLEX MICROSCOPIC
Bilirubin Urine: NEGATIVE
Glucose, UA: NEGATIVE mg/dL
Ketones, ur: NEGATIVE mg/dL
Nitrite: NEGATIVE
Protein, ur: NEGATIVE mg/dL
Specific Gravity, Urine: 1.017 (ref 1.005–1.030)
pH: 7 (ref 5.0–8.0)

## 2014-12-01 LAB — POC URINE PREG, ED: Preg Test, Ur: NEGATIVE

## 2014-12-01 LAB — WET PREP, GENITAL
Clue Cells Wet Prep HPF POC: NONE SEEN
Sperm: NONE SEEN
Trich, Wet Prep: NONE SEEN
Yeast Wet Prep HPF POC: NONE SEEN

## 2014-12-01 LAB — URINE MICROSCOPIC-ADD ON

## 2014-12-01 MED ORDER — ACYCLOVIR 200 MG PO CAPS: 200.0000 mg | ORAL_CAPSULE | Freq: Every day | ORAL | Status: AC

## 2014-12-01 MED ORDER — CEPHALEXIN 500 MG PO CAPS: 500.0000 mg | ORAL_CAPSULE | Freq: Four times a day (QID) | ORAL | Status: DC

## 2014-12-01 MED ORDER — LIDOCAINE HCL (PF) 1 % IJ SOLN
INTRAMUSCULAR | Status: AC
  Administered 2014-12-01: 2.5 mL
  Filled 2014-12-01: qty 15

## 2014-12-01 MED ORDER — CEFTRIAXONE SODIUM 250 MG IJ SOLR
250.0000 mg | Freq: Once | INTRAMUSCULAR | Status: AC
Start: 2014-12-01 — End: 2014-12-01
  Administered 2014-12-01: 250 mg via INTRAMUSCULAR
  Filled 2014-12-01: qty 250

## 2014-12-01 MED ORDER — ACYCLOVIR 200 MG PO CAPS: 200.0000 mg | ORAL_CAPSULE | Freq: Every day | ORAL | Status: DC

## 2014-12-01 MED ORDER — AZITHROMYCIN 250 MG PO TABS
1000.0000 mg | ORAL_TABLET | Freq: Once | ORAL | Status: AC
  Administered 2014-12-01: 1000 mg via ORAL
  Filled 2014-12-01: qty 4

## 2014-12-01 NOTE — ED Notes (Signed)
Pt states she was here "a while ago to get tested for STDs because she thought her boyfriend cheated".  Pt states that she didn't get a call so she guesses everything is ok.  Pt states that she noticed a "bump" down there and states that she has had pain when she urinates.  States that her aunt got a "zpack for a cold".  States that she googled that zpack treated UTI so she decided to take some of her aunt's abx but she doesn't feel any better.

## 2014-12-01 NOTE — ED Provider Notes (Signed)
CSN: [id number]     Arrival date & time 12/01/14  1315 History  By signing my name below, I, Beth Carr, attest that this documentation has been prepared under the direction and in the presence of Marlon Pel, PA-C.  Electronically Signed: Murriel Carr, ED Scribe. 12/01/2014. 5:30 PM.    Chief Complaint  Patient presents with  . Vaginal pain   . Dysuria      The history is provided by the patient. No language interpreter was used.   HPI Comments: Beth Carr is a 19 y.o. female who presents to the Emergency Department complaining of constant dysuria with associated infections to her skin that are present around the outside of her vagina that have been present for around a week. Pt states she came into ED to get tested for STD's a month ago because she thought her boyfriend cheated on her, and states she never received a call about any positive results. Pt states she had a red, itching bump on the outside of her vagina pop up around a week ago, and states two or three more appeared in the past couple of days. Pt states she has been using Lotrimin Ultra on the area with no relief. Pt requests to be tested for herpes today because she had her aunt look at it and thinks that's what it may be. No fevers, abdominal pains, diffuse rash to body, vaginal discharge. She has had "dysuria" pain to the rash when she urinates.   Past Medical History  Diagnosis Date  . Asthma   . Dysmenorrhea     Tired OCPs in past X2 but reports worsening of dysmenorrhea and worsening of clots   Past Surgical History  Procedure Laterality Date  . Tooth extraction     Family History  Problem Relation Age of Onset  . Diabetes Other   . Cancer Other    Social History  Substance Use Topics  . Smoking status: Never Smoker   . Smokeless tobacco: None  . Alcohol Use: No   OB History    No data available     Review of Systems  Genitourinary: Positive for dysuria.  Skin: Positive for color change  and rash.  All other systems reviewed and are negative.     Allergies  Review of patient's allergies indicates no known allergies.  Home Medications   Prior to Admission medications   Medication Sig Start Date End Date Taking? Authorizing Provider  albuterol (PROVENTIL HFA;VENTOLIN HFA) 108 (90 BASE) MCG/ACT inhaler Inhale 3 puffs into the lungs every 4 (four) hours as needed for wheezing. 09/29/11  Yes Marcellina Millin, MD  ibuprofen (ADVIL,MOTRIN) 200 MG tablet Take 800 mg by mouth every 6 (six) hours as needed for moderate pain.    Yes Historical Provider, MD  acyclovir (ZOVIRAX) 200 MG capsule Take 1 capsule (200 mg total) by mouth 5 (five) times daily. 12/01/14 12/10/14  Marlon Pel, PA-C  ciprofloxacin (CIPRO) 500 MG tablet Take 1 tablet (500 mg total) by mouth every 12 (twelve) hours. Patient not taking: Reported on 05/24/2014 05/09/14   Rolland Porter, MD  fluconazole (DIFLUCAN) 150 MG tablet Take 1 tablet (150 mg total) by mouth once. Patient not taking: Reported on 12/01/2014 10/25/14   Kathrynn Speed, PA-C  metroNIDAZOLE (FLAGYL) 500 MG tablet Take 1 tablet (500 mg total) by mouth 3 (three) times daily. Patient not taking: Reported on 05/24/2014 05/09/14   Rolland Porter, MD  ondansetron (ZOFRAN ODT) 4 MG disintegrating tablet Take 1 tablet (  4 mg total) by mouth every 8 (eight) hours as needed for nausea or vomiting. Patient not taking: Reported on 12/01/2014 05/24/14   Everlene FarrierWilliam Dansie, PA-C   BP 121/75 mmHg  Pulse 79  Temp(Src) 98 F (36.7 C) (Oral)  Resp 18  SpO2 98% Physical Exam  Constitutional: She is oriented to person, place, and time. She appears well-developed and well-nourished.  HENT:  Head: Normocephalic and atraumatic.  Cardiovascular: Normal rate.   Pulmonary/Chest: Effort normal.  Abdominal: Bowel sounds are normal. She exhibits no distension. There is no tenderness. There is no rebound, no guarding and no CVA tenderness.  Genitourinary: Uterus normal.    There is no  tenderness on the right labia. There is rash on the left labia. There is no tenderness on the left labia. Cervix exhibits no motion tenderness, no discharge and no friability. No tenderness or bleeding in the vagina. No foreign body around the vagina. No vaginal discharge found.  Neurological: She is alert and oriented to person, place, and time.  Skin: Skin is warm and dry.  Psychiatric: She has a normal mood and affect.  Nursing note and vitals reviewed.   ED Course  Procedures (including critical care time)  DIAGNOSTIC STUDIES: Oxygen Saturation is 98% on room air, normal by my interpretation.    COORDINATION OF CARE: 4:22 PM Discussed treatment plan with pt at bedside and pt agreed to plan.   Labs Review Labs Reviewed  WET PREP, GENITAL - Abnormal; Notable for the following:    WBC, Wet Prep HPF POC MANY (*)    All other components within normal limits  URINALYSIS, ROUTINE W REFLEX MICROSCOPIC (NOT AT [facility]) - Abnormal; Notable for the following:    APPearance HAZY (*)    Hgb urine dipstick TRACE (*)    Leukocytes, UA MODERATE (*)    All other components within normal limits  URINE MICROSCOPIC-ADD ON - Abnormal; Notable for the following:    Squamous Epithelial / LPF 6-30 (*)    Bacteria, UA RARE (*)    All other components within normal limits  URINE CULTURE  HIV ANTIBODY (ROUTINE TESTING)  RPR  HSV 1 ANTIBODY, IGG  HSV 2 ANTIBODY, IGG  POC URINE PREG, ED  GC/CHLAMYDIA PROBE AMP ([place]) NOT AT [facility]    Imaging Review No results found. I have personally reviewed and evaluated these images and lab results as part of my medical decision-making.   EKG Interpretation None      MDM   Final diagnoses:  Concern about STD in female without diagnosis    Question contamination of urine, will send out urine culture. She is positive for MANY WBC, given 250 mg Rocephin and 1 gram Azithro in the ED today. Concern for herpes HSV1 and 2 titers sent out as well as RPR  and HIV. Will start on Acyclovir.   Patient is otherwise well appearing out any  Systemic symptoms.  Referral to womens outpatient clinic. Patient information and education given about herpes.  Advised to practice safe sex and have all partners evaluated and treated at the local health department. Also advised to follow with the health Department in 1-2 weeks to confirm effectiveness of treatment and receive additional education/evaluation. Return precautions given.  Medications  cefTRIAXone (ROCEPHIN) injection 250 mg (not administered)  azithromycin (ZITHROMAX) tablet 1,000 mg (not administered)  lidocaine (PF) (XYLOCAINE) 1 % injection (not administered)     I feel the patient has had an appropriate workup for their chief complaint at this  time and likelihood of emergent condition existing is low. Discussed s/sx that warrant return to the ED.  Filed Vitals:   12/01/14 1320  BP: 121/75  Pulse: 79  Temp: 98 F (36.7 C)  Resp: [address], PA-C 12/01/14 1732  Marlon Pel, PA-C 12/01/14 1732  Lorre Nick, MD 12/01/14 [phone]

## 2014-12-01 NOTE — Discharge Instructions (Signed)

## 2014-12-02 ENCOUNTER — Telehealth (HOSPITAL_COMMUNITY): Payer: Self-pay | Admitting: *Deleted

## 2014-12-02 LAB — GC/CHLAMYDIA PROBE AMP (~~LOC~~) NOT AT ARMC
Chlamydia: NEGATIVE
Neisseria Gonorrhea: NEGATIVE

## 2014-12-02 LAB — RPR: RPR Ser Ql: NONREACTIVE

## 2014-12-02 LAB — HIV ANTIBODY (ROUTINE TESTING W REFLEX): HIV SCREEN 4TH GENERATION: NONREACTIVE

## 2014-12-03 LAB — URINE CULTURE: Special Requests: NORMAL

## 2014-12-03 LAB — HSV 1 ANTIBODY, IGG: HSV 1 Glycoprotein G Ab, IgG: <0.91 index (ref 0.00–0.90)

## 2014-12-03 LAB — HSV 2 ANTIBODY, IGG: HSV 2 Glycoprotein G Ab, IgG: 2.1 index — ABNORMAL HIGH (ref 0.00–0.90)

## 2014-12-08 ENCOUNTER — Telehealth (HOSPITAL_COMMUNITY): Payer: Self-pay

## 2014-12-19 ENCOUNTER — Encounter (HOSPITAL_COMMUNITY): Payer: Self-pay

## 2014-12-19 ENCOUNTER — Emergency Department (HOSPITAL_COMMUNITY)
Admission: EM | Admit: 2014-12-19 | Discharge: 2014-12-19 | Disposition: A | Discharge status: Home / Self Care | Payer: Medicaid Other | Attending: Emergency Medicine | Admitting: Emergency Medicine

## 2014-12-19 DIAGNOSIS — Z79899 Other long term (current) drug therapy: Secondary | ICD-10-CM | POA: Insufficient documentation

## 2014-12-19 DIAGNOSIS — R519 Headache, unspecified: Secondary | ICD-10-CM

## 2014-12-19 DIAGNOSIS — M542 Cervicalgia: Secondary | ICD-10-CM | POA: Diagnosis not present

## 2014-12-19 DIAGNOSIS — Z8742 Personal history of other diseases of the female genital tract: Secondary | ICD-10-CM | POA: Insufficient documentation

## 2014-12-19 DIAGNOSIS — M7981 Nontraumatic hematoma of soft tissue: Secondary | ICD-10-CM | POA: Insufficient documentation

## 2014-12-19 DIAGNOSIS — J45909 Unspecified asthma, uncomplicated: Secondary | ICD-10-CM | POA: Diagnosis not present

## 2014-12-19 DIAGNOSIS — Z3202 Encounter for pregnancy test, result negative: Secondary | ICD-10-CM | POA: Insufficient documentation

## 2014-12-19 DIAGNOSIS — H538 Other visual disturbances: Secondary | ICD-10-CM | POA: Insufficient documentation

## 2014-12-19 DIAGNOSIS — R58 Hemorrhage, not elsewhere classified: Secondary | ICD-10-CM

## 2014-12-19 DIAGNOSIS — R51 Headache: Secondary | ICD-10-CM | POA: Diagnosis present

## 2014-12-19 LAB — BASIC METABOLIC PANEL
ANION GAP: 6 (ref 5–15)
BUN: 11 mg/dL (ref 6–20)
CALCIUM: 8.9 mg/dL (ref 8.9–10.3)
CO2: 25 mmol/L (ref 22–32)
Chloride: 109 mmol/L (ref 101–111)
Creatinine, Ser: 0.74 mg/dL (ref 0.44–1.00)
Glucose, Bld: 92 mg/dL (ref 65–99)
Potassium: 3.6 mmol/L (ref 3.5–5.1)
SODIUM: 140 mmol/L (ref 135–145)

## 2014-12-19 LAB — PREGNANCY, URINE: PREG TEST UR: NEGATIVE

## 2014-12-19 MED ORDER — PROCHLORPERAZINE EDISYLATE 5 MG/ML IJ SOLN
10.0000 mg | Freq: Once | INTRAMUSCULAR | Status: AC
  Administered 2014-12-19: 10 mg via INTRAVENOUS
  Filled 2014-12-19: qty 2

## 2014-12-19 MED ORDER — ALBUTEROL SULFATE HFA 108 (90 BASE) MCG/ACT IN AERS: 2.0000 | INHALATION_SPRAY | RESPIRATORY_TRACT | Status: DC | PRN

## 2014-12-19 MED ORDER — DIPHENHYDRAMINE HCL 50 MG/ML IJ SOLN
25.0000 mg | Freq: Once | INTRAMUSCULAR | Status: AC
  Administered 2014-12-19: 25 mg via INTRAVENOUS
  Filled 2014-12-19: qty 1

## 2014-12-19 MED ORDER — DIPHENHYDRAMINE HCL 50 MG/ML IJ SOLN
12.5000 mg | Freq: Once | INTRAMUSCULAR | Status: AC
  Administered 2014-12-19: 12.5 mg via INTRAVENOUS
  Filled 2014-12-19: qty 1

## 2014-12-19 MED ORDER — SODIUM CHLORIDE 0.9 % IV BOLUS (SEPSIS)
1000.0000 mL | Freq: Once | INTRAVENOUS | Status: AC
  Administered 2014-12-19: 1000 mL via INTRAVENOUS

## 2014-12-19 NOTE — ED Notes (Signed)
Patient states started having neck and headache yesterday afternoon at 1400.

## 2014-12-19 NOTE — Discharge Instructions (Signed)

## 2014-12-19 NOTE — ED Notes (Signed)
Pt reports she had headache yesterday, pt went to bed and woke up with blurred vision and bruising around the right side of her eye. Pt is alert and oriented denies hx of migraines. Pt reports increased frequency of headaches over the last year.

## 2014-12-19 NOTE — ED Provider Notes (Signed)
CSN: [id number]     Arrival date & time 12/19/14  [id number] History   First MD Initiated Contact with Patient 12/19/14 [phone]     Chief Complaint  Patient presents with  . Headache  . Neck Pain     (Consider location/radiation/quality/duration/timing/severity/associated sxs/prior Treatment) HPI Comments: 19 year old female with history of asthma, dysmenorrhea who presents with headache and neck pain. The patient states that yesterday she had a gradual onset of headache that is located on the top of her head and eventually became severe. She has had associated photophobia and phonophobia. She noted some mild blurry vision yesterday. The blurry vision has improved today. She has also noticed some right-sided neck pain. She denies any sudden onset of headache or vomiting. She states that this morning she woke up and noticed some bruising around the right side of her eye which made her concerned. She does report having to blow her nose a lot recently for nasal congestion. She denies any fevers. In contrast to triage note, the patient told me that she has had migraines previously. She states the headache is similar to previous migraines however the broken blood vessel concerned her. No head trauma.  Patient is a 19 y.o. female presenting with headaches and neck pain. The history is provided by the patient.  Headache Associated symptoms: neck pain   Neck Pain Associated symptoms: headaches     Past Medical History  Diagnosis Date  . Asthma   . Dysmenorrhea     Tired OCPs in past X2 but reports worsening of dysmenorrhea and worsening of clots   Past Surgical History  Procedure Laterality Date  . Tooth extraction     Family History  Problem Relation Age of Onset  . Diabetes Other   . Cancer Other    Social History  Substance Use Topics  . Smoking status: Never Smoker   . Smokeless tobacco: None  . Alcohol Use: No   OB History    No data available     Review of Systems    Musculoskeletal: Positive for neck pain.  Neurological: Positive for headaches.   10 Systems reviewed and are negative for acute change except as noted in the HPI.    Allergies  Review of patient's allergies indicates no known allergies.  Home Medications   Prior to Admission medications   Medication Sig Start Date End Date Taking? Authorizing Provider  albuterol (PROVENTIL HFA;VENTOLIN HFA) 108 (90 BASE) MCG/ACT inhaler Inhale 3 puffs into the lungs every 4 (four) hours as needed for wheezing. 09/29/11  Yes Marcellina Millinimothy Galey, MD  ibuprofen (ADVIL,MOTRIN) 200 MG tablet Take 800 mg by mouth every 6 (six) hours as needed for moderate pain.    Yes Historical Provider, MD  ciprofloxacin (CIPRO) 500 MG tablet Take 1 tablet (500 mg total) by mouth every 12 (twelve) hours. Patient not taking: Reported on 05/24/2014 05/09/14   Rolland PorterMark James, MD  fluconazole (DIFLUCAN) 150 MG tablet Take 1 tablet (150 mg total) by mouth once. Patient not taking: Reported on 12/01/2014 10/25/14   Kathrynn Speedobyn M Hess, PA-C  metroNIDAZOLE (FLAGYL) 500 MG tablet Take 1 tablet (500 mg total) by mouth 3 (three) times daily. Patient not taking: Reported on 05/24/2014 05/09/14   Rolland PorterMark James, MD  ondansetron (ZOFRAN ODT) 4 MG disintegrating tablet Take 1 tablet (4 mg total) by mouth every 8 (eight) hours as needed for nausea or vomiting. Patient not taking: Reported on 12/01/2014 05/24/14   Everlene FarrierWilliam Dansie, PA-C   BP 132/78 mmHg  Pulse  75  Temp(Src) 98.4 F (36.9 C) (Oral)  Ht  (1.6 m)  Wt 118 lb (53.524 kg)  BMI 20.91 kg/m2  SpO2 100%  LMP 12/11/2014 (Within Days) Physical Exam  Constitutional: She is oriented to person, place, and time. She appears well-developed and well-nourished. No distress.  Awake, alert  HENT:  Head: Normocephalic and atraumatic.  Eyes: Conjunctivae and EOM are normal. Pupils are equal, round, and reactive to light.  Neck: Normal range of motion. Neck supple.  R paraspinal cervical muscle tenderness   Cardiovascular: Normal rate, regular rhythm and normal heart sounds.   No murmur heard. Pulmonary/Chest: Effort normal and breath sounds normal. No respiratory distress.  Abdominal: Soft. Bowel sounds are normal. She exhibits no distension.  Musculoskeletal: She exhibits no edema.  Neurological: She is alert and oriented to person, place, and time. She has normal reflexes. No cranial nerve deficit. She exhibits normal muscle tone.  Fluent speech, normal finger-to-nose testing, 5/5 strength and normal sensation throughout  Skin: Skin is warm and dry.     Ecchymosis lateral to R eye  Psychiatric: She has a normal mood and affect. Judgment and thought content normal.  Nursing note and vitals reviewed.   ED Course  Procedures (including critical care time) Labs Review Labs Reviewed  PREGNANCY, URINE  BASIC METABOLIC PANEL    Imaging Review No results found. I have personally reviewed and evaluated these lab results as part of my medical decision-making.   EKG Interpretation None     Medications  sodium chloride 0.9 % bolus 1,000 mL (1,000 mLs Intravenous New Bag/Given 12/19/14 0911)  diphenhydrAMINE (BENADRYL) injection 25 mg (25 mg Intravenous Given 12/19/14 0911)  prochlorperazine (COMPAZINE) injection 10 mg (10 mg Intravenous Given 12/19/14 0911)  diphenhydrAMINE (BENADRYL) injection 12.5 mg (12.5 mg Intravenous Given 12/19/14 0937)    MDM   Final diagnoses:  Nonintractable headache, unspecified chronicity pattern, unspecified headache type  Ecchymosis   Patient presents with 1 day of gradually worsening headache associated with right-sided neck pain and blurry vision. She noticed some bruising around her right eye this morning and became concerned. On exam, the patient was well-appearing with normal vital signs. Normal neurologic exam. She had a small ecchymoses lateral to her right eye which appeared to be a broken blood vessel. She denied any sudden onset of headache  and has had no vomiting, therefore my suspicion for acute intracranial process such as intercranial hemorrhage is low. However, during my examination of the patient, her mother became angry stating that she was very concerned about the patient's headache and demanded head imaging. I explained that my suspicion for intracranial process is low given normal neuro exam and no sudden onset. I reviewed risks of imaging including radiation exposure in this young female. The patient and her mother voiced understanding but continued to demand imaging. Obtained above labs, gave pt IVF bolus, benadryl, and compazine.   On reexamination after receiving above medications, the patient was resting comfortably and stated that her headache was very much improved. I talked with her alone without her mother in the room and she stated that she actually did not want any head imaging. She stated that the headache was similar to previous headaches it was just a broken blood vessel that concerned her today. She stated that because she felt better she wanted to go home without any imaging. I reemphasized that I am willing to order imaging if she is very concerned but she stated that she was not; it was  only her mother who had been pushing for head CT. I extensively reviewed return precautions including any logical symptoms or worsening symptoms, or for any problems with vision in her right eye. The patient voiced understanding.  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 She denies any outbreaks symptoms currently. I instructed her to follow-up at the women's clinic to establish care for this problem. Also stressed importance of using condoms and avoiding sexual intercourse during outbreaks. She asked for a refill on her albuterol which I provided for her. Patient voiced understanding and was discharged in satisfactory condition.  Laurence Spates, MD 12/19/14  [phone]

## 2014-12-19 NOTE — ED Notes (Signed)
Pt reports second dose of benadryl has helped improve her symptoms. Pt now just reports feeling drowsy.

## 2014-12-19 NOTE — ED Notes (Signed)
Pt reporting she does not want to have the head CT. Pt reports she feels better. This RN notified the MD that pt wants to be discharged.

## 2014-12-19 NOTE — ED Notes (Signed)
Pt reports she feels like her legs are restless, this RN explained that could be a side effect of the compazine. Will recheck patient soon or ask MD for another order for benadryl.

## 2015-01-15 ENCOUNTER — Encounter (HOSPITAL_COMMUNITY): Payer: Self-pay | Admitting: *Deleted

## 2015-01-15 ENCOUNTER — Emergency Department (HOSPITAL_COMMUNITY)
Admission: EM | Admit: 2015-01-15 | Discharge: 2015-01-15 | Disposition: A | Discharge status: Home / Self Care | Payer: Medicaid Other | Attending: Emergency Medicine | Admitting: Emergency Medicine

## 2015-01-15 ENCOUNTER — Emergency Department (HOSPITAL_COMMUNITY): Payer: Medicaid Other

## 2015-01-15 DIAGNOSIS — J45909 Unspecified asthma, uncomplicated: Secondary | ICD-10-CM | POA: Insufficient documentation

## 2015-01-15 DIAGNOSIS — W010XXA Fall on same level from slipping, tripping and stumbling without subsequent striking against object, initial encounter: Secondary | ICD-10-CM | POA: Diagnosis not present

## 2015-01-15 DIAGNOSIS — S46912A Strain of unspecified muscle, fascia and tendon at shoulder and upper arm level, left arm, initial encounter: Secondary | ICD-10-CM | POA: Diagnosis not present

## 2015-01-15 DIAGNOSIS — Y9389 Activity, other specified: Secondary | ICD-10-CM | POA: Diagnosis not present

## 2015-01-15 DIAGNOSIS — Z8742 Personal history of other diseases of the female genital tract: Secondary | ICD-10-CM | POA: Diagnosis not present

## 2015-01-15 DIAGNOSIS — Z79899 Other long term (current) drug therapy: Secondary | ICD-10-CM | POA: Diagnosis not present

## 2015-01-15 DIAGNOSIS — Y998 Other external cause status: Secondary | ICD-10-CM | POA: Insufficient documentation

## 2015-01-15 DIAGNOSIS — Y9289 Other specified places as the place of occurrence of the external cause: Secondary | ICD-10-CM | POA: Diagnosis not present

## 2015-01-15 DIAGNOSIS — S4992XA Unspecified injury of left shoulder and upper arm, initial encounter: Secondary | ICD-10-CM | POA: Diagnosis present

## 2015-01-15 MED ORDER — IBUPROFEN 400 MG PO TABS
600.0000 mg | ORAL_TABLET | Freq: Once | ORAL | Status: AC
  Administered 2015-01-15: 600 mg via ORAL
  Filled 2015-01-15: qty 1

## 2015-01-15 MED ORDER — HYDROCODONE-ACETAMINOPHEN 5-325 MG PO TABS
1.0000 | ORAL_TABLET | Freq: Once | ORAL | Status: AC
  Administered 2015-01-15: 1 via ORAL
  Filled 2015-01-15: qty 1

## 2015-01-15 NOTE — Discharge Instructions (Signed)
Take Tylenol every 4 hours and Motrin every 6 hours as needed for pain and inflammation. Use ice every 3-4 hours as needed.  If you were given medicines take as directed.  If you are on coumadin or contraceptives realize their levels and effectiveness is altered by many different medicines.  If you have any reaction (rash, tongues swelling, other) to the medicines stop taking and see a physician.    If your blood pressure was elevated in the ER make sure you follow up for management with a primary doctor or return for chest pain, shortness of breath or stroke symptoms.  Please follow up as directed and return to the ER or see a physician for new or worsening symptoms.  Thank you. Filed Vitals:   01/15/15 1142  BP: 115/78  Pulse: 89  Temp: 97.9 F (36.6 C)  TempSrc: Oral  Resp: 18  SpO2: 98%

## 2015-01-15 NOTE — ED Notes (Signed)
Declined W/C at D/C and was escorted to lobby by RN. 

## 2015-01-15 NOTE — ED Notes (Signed)
Pt back from X-ray.  

## 2015-01-15 NOTE — ED Provider Notes (Signed)
CSN: [id number]     Arrival date & time 01/15/15  1109 History  By signing my name below, I, [name], attest that this documentation has been prepared under the direction and in the presence of Everlene Farrier, PA-C. Electronically Signed: Angelene Giovanni, ED Scribe. 01/15/2015. 12:14 PM.    Chief Complaint  Patient presents with  . Fall  . Shoulder Pain   HPI Comments: 20 year old female with no significant medical issues presents with left shoulder pain since slipping and falling down yesterday. Patient's left arm and shoulder got stretched out laterally. No other significant injuries. No neurologic symptoms.  The history is provided by the patient. No language interpreter was used.   HPI Comments: Beth Carr is a 20 y.o. female who presents to the Emergency Department complaining of shoulder pain   Past Medical History  Diagnosis Date  . Asthma   . Dysmenorrhea     Tired OCPs in past X2 but reports worsening of dysmenorrhea and worsening of clots   Past Surgical History  Procedure Laterality Date  . Tooth extraction     Family History  Problem Relation Age of Onset  . Diabetes Other   . Cancer Other    Social History  Substance Use Topics  . Smoking status: Never Smoker   . Smokeless tobacco: None  . Alcohol Use: No   OB History    No data available     Review of Systems  Constitutional: Negative for fever and chills.  HENT: Negative for congestion.   Respiratory: Negative for shortness of breath.   Cardiovascular: Negative for chest pain.  Gastrointestinal: Negative for vomiting and abdominal pain.  Genitourinary: Negative for dysuria and flank pain.  Musculoskeletal: Positive for arthralgias. Negative for back pain, neck pain and neck stiffness.  Skin: Negative for rash.  Neurological: Negative for light-headedness and headaches.      Allergies  Review of patient's allergies indicates no known allergies.  Home Medications   Prior to  Admission medications   Medication Sig Start Date End Date Taking? Authorizing Provider  albuterol (PROVENTIL HFA;VENTOLIN HFA) 108 (90 BASE) MCG/ACT inhaler Inhale 2 puffs into the lungs every 4 (four) hours as needed for wheezing or shortness of breath. 12/19/14   Laurence Spates, MD  ibuprofen (ADVIL,MOTRIN) 200 MG tablet Take 800 mg by mouth every 6 (six) hours as needed for moderate pain.     Historical Provider, MD   BP 115/78 mmHg  Pulse 89  Temp(Src) 97.9 F (36.6 C) (Oral)  Resp 18  SpO2 98%  LMP 12/28/2014 Physical Exam  Constitutional: She is oriented to person, place, and time. She appears well-developed and well-nourished.  HENT:  Head: Normocephalic and atraumatic.  Eyes: Right eye exhibits no discharge. Left eye exhibits no discharge.  Neck: Normal range of motion. Neck supple. No tracheal deviation present.  Cardiovascular: Normal rate.   Pulmonary/Chest: Effort normal.  Musculoskeletal: She exhibits edema and tenderness.  Neurological: She is alert and oriented to person, place, and time.  Skin: Skin is warm. No rash noted.  Psychiatric: She has a normal mood and affect.  Nursing note and vitals reviewed.   ED Course  Procedures (including critical care time) DIAGNOSTIC STUDIES: Oxygen Saturation is 98% on RA, normal by my interpretation.    COORDINATION OF CARE: 12:14 PM- Pt advised of plan for treatment and pt agrees.    Labs Review Labs Reviewed - No data to display  Imaging Review No results found.   Everlene Farrier,  PA-C has personally reviewed and evaluated these images and lab results as part of his medical decision-making.   EKG Interpretation None      MDM   Final diagnoses:  None   Low risk fall, xray no acute findings, sling and outpt fup. Neuro intact.  Results and differential diagnosis were discussed with the patient/parent/guardian. Xrays were independently reviewed by myself.  Close follow up outpatient was discussed,  comfortable with the plan.   Medications  ibuprofen (ADVIL,MOTRIN) tablet 600 mg (600 mg Oral Given 01/15/15 1252)  HYDROcodone-acetaminophen (NORCO/VICODIN) 5-325 MG per tablet 1 tablet (1 tablet Oral Given 01/15/15 1253)    Filed Vitals:   01/15/15 1142  BP: 115/78  Pulse: 89  Temp: 97.9 F (36.6 C)  TempSrc: Oral  Resp: 18  SpO2: 98%    Final diagnoses:  Left shoulder strain, initial encounter      Blane OharaJoshua Ahmari Duerson, MD 01/18/15 0945

## 2015-01-15 NOTE — ED Notes (Signed)
Pt reports falling on ice yesterday and having left shoulder pain since. +radial pulse, able to move digits.

## 2015-04-14 ENCOUNTER — Encounter (HOSPITAL_COMMUNITY): Payer: Self-pay | Admitting: Emergency Medicine

## 2015-04-14 ENCOUNTER — Emergency Department (HOSPITAL_COMMUNITY): Payer: No Typology Code available for payment source

## 2015-04-14 ENCOUNTER — Emergency Department (HOSPITAL_COMMUNITY)
Admission: EM | Admit: 2015-04-14 | Discharge: 2015-04-14 | Disposition: A | Discharge status: Home / Self Care | Payer: No Typology Code available for payment source | Attending: Emergency Medicine | Admitting: Emergency Medicine

## 2015-04-14 DIAGNOSIS — Z8742 Personal history of other diseases of the female genital tract: Secondary | ICD-10-CM | POA: Insufficient documentation

## 2015-04-14 DIAGNOSIS — S9032XA Contusion of left foot, initial encounter: Secondary | ICD-10-CM | POA: Diagnosis not present

## 2015-04-14 DIAGNOSIS — Y9389 Activity, other specified: Secondary | ICD-10-CM | POA: Insufficient documentation

## 2015-04-14 DIAGNOSIS — R52 Pain, unspecified: Secondary | ICD-10-CM

## 2015-04-14 DIAGNOSIS — S8002XA Contusion of left knee, initial encounter: Secondary | ICD-10-CM

## 2015-04-14 DIAGNOSIS — Y998 Other external cause status: Secondary | ICD-10-CM | POA: Diagnosis not present

## 2015-04-14 DIAGNOSIS — J45909 Unspecified asthma, uncomplicated: Secondary | ICD-10-CM | POA: Insufficient documentation

## 2015-04-14 DIAGNOSIS — S199XXA Unspecified injury of neck, initial encounter: Secondary | ICD-10-CM | POA: Diagnosis not present

## 2015-04-14 DIAGNOSIS — Z3202 Encounter for pregnancy test, result negative: Secondary | ICD-10-CM | POA: Diagnosis not present

## 2015-04-14 DIAGNOSIS — S6991XA Unspecified injury of right wrist, hand and finger(s), initial encounter: Secondary | ICD-10-CM | POA: Diagnosis not present

## 2015-04-14 DIAGNOSIS — Y92410 Unspecified street and highway as the place of occurrence of the external cause: Secondary | ICD-10-CM | POA: Insufficient documentation

## 2015-04-14 DIAGNOSIS — Z79899 Other long term (current) drug therapy: Secondary | ICD-10-CM | POA: Insufficient documentation

## 2015-04-14 DIAGNOSIS — R110 Nausea: Secondary | ICD-10-CM | POA: Diagnosis not present

## 2015-04-14 DIAGNOSIS — S3992XA Unspecified injury of lower back, initial encounter: Secondary | ICD-10-CM | POA: Diagnosis not present

## 2015-04-14 DIAGNOSIS — S8992XA Unspecified injury of left lower leg, initial encounter: Secondary | ICD-10-CM | POA: Diagnosis present

## 2015-04-14 LAB — I-STAT BETA HCG BLOOD, ED (MC, WL, AP ONLY): I-stat hCG, quantitative: <5 m[IU]/mL (ref <5)

## 2015-04-14 MED ORDER — METHOCARBAMOL 500 MG PO TABS
500.0000 mg | ORAL_TABLET | Freq: Two times a day (BID) | ORAL | Status: DC
Start: 2015-04-14 — End: 2015-12-04

## 2015-04-14 MED ORDER — METHOCARBAMOL 500 MG PO TABS
1000.0000 mg | ORAL_TABLET | Freq: Once | ORAL | Status: AC
  Administered 2015-04-14: 1000 mg via ORAL
  Filled 2015-04-14: qty 2

## 2015-04-14 MED ORDER — HYDROCODONE-ACETAMINOPHEN 5-325 MG PO TABS
1.0000 | ORAL_TABLET | ORAL | Status: DC | PRN
Start: 2015-04-14 — End: 2015-12-04

## 2015-04-14 MED ORDER — MORPHINE SULFATE (PF) 4 MG/ML IV SOLN
4.0000 mg | Freq: Once | INTRAVENOUS | Status: AC
  Administered 2015-04-14: 4 mg via INTRAVENOUS
  Filled 2015-04-14: qty 1
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
## 2015-04-14 NOTE — ED Notes (Signed)
Pt taken to Xray.
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
## 2015-04-14 NOTE — ED Notes (Signed)
Pt taken to XRay 
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
## 2015-04-14 NOTE — ED Notes (Signed)
Ice applied

## 2015-12-04 ENCOUNTER — Inpatient Hospital Stay (HOSPITAL_COMMUNITY): Payer: Self-pay

## 2015-12-04 ENCOUNTER — Encounter (HOSPITAL_COMMUNITY): Payer: Self-pay | Admitting: *Deleted

## 2015-12-04 ENCOUNTER — Inpatient Hospital Stay (HOSPITAL_COMMUNITY)
Admission: AD | Admit: 2015-12-04 | Discharge: 2015-12-04 | Disposition: A | Discharge status: Home / Self Care | Payer: Self-pay | Source: Ambulatory Visit | Attending: Obstetrics and Gynecology | Admitting: Obstetrics and Gynecology

## 2015-12-04 DIAGNOSIS — Z79899 Other long term (current) drug therapy: Secondary | ICD-10-CM | POA: Insufficient documentation

## 2015-12-04 DIAGNOSIS — Z3A01 Less than 8 weeks gestation of pregnancy: Secondary | ICD-10-CM | POA: Insufficient documentation

## 2015-12-04 DIAGNOSIS — R109 Unspecified abdominal pain: Secondary | ICD-10-CM

## 2015-12-04 DIAGNOSIS — O98812 Other maternal infectious and parasitic diseases complicating pregnancy, second trimester: Secondary | ICD-10-CM | POA: Insufficient documentation

## 2015-12-04 DIAGNOSIS — O98811 Other maternal infectious and parasitic diseases complicating pregnancy, first trimester: Secondary | ICD-10-CM

## 2015-12-04 DIAGNOSIS — B3731 Acute candidiasis of vulva and vagina: Secondary | ICD-10-CM

## 2015-12-04 DIAGNOSIS — O26891 Other specified pregnancy related conditions, first trimester: Secondary | ICD-10-CM | POA: Insufficient documentation

## 2015-12-04 DIAGNOSIS — O26899 Other specified pregnancy related conditions, unspecified trimester: Secondary | ICD-10-CM

## 2015-12-04 DIAGNOSIS — O3680X0 Pregnancy with inconclusive fetal viability, not applicable or unspecified: Secondary | ICD-10-CM

## 2015-12-04 DIAGNOSIS — B373 Candidiasis of vulva and vagina: Secondary | ICD-10-CM

## 2015-12-04 DIAGNOSIS — R1030 Lower abdominal pain, unspecified: Secondary | ICD-10-CM | POA: Insufficient documentation

## 2015-12-04 HISTORY — DX: Herpesviral infection of urogenital system, unspecified: A60.00

## 2015-12-04 LAB — URINALYSIS, ROUTINE W REFLEX MICROSCOPIC
Bilirubin Urine: NEGATIVE
Glucose, UA: NEGATIVE mg/dL
Ketones, ur: 40 mg/dL — AB
Leukocytes, UA: NEGATIVE
Nitrite: NEGATIVE
PROTEIN: NEGATIVE mg/dL
Specific Gravity, Urine: 1.025 (ref 1.005–1.030)
pH: 6 (ref 5.0–8.0)

## 2015-12-04 LAB — CBC
HCT: 39.2 % (ref 36.0–46.0)
HEMOGLOBIN: 14 g/dL (ref 12.0–15.0)
MCH: 33.6 pg (ref 26.0–34.0)
MCHC: 35.7 g/dL (ref 30.0–36.0)
MCV: 94 fL (ref 78.0–100.0)
PLATELETS: 153 10*3/uL (ref 150–400)
RBC: 4.17 MIL/uL (ref 3.87–5.11)
RDW: 12.2 % (ref 11.5–15.5)
WBC: 8.9 10*3/uL (ref 4.0–10.5)

## 2015-12-04 LAB — URINE MICROSCOPIC-ADD ON
BACTERIA UA: NONE SEEN
RBC / HPF: NONE SEEN RBC/hpf (ref 0–5)

## 2015-12-04 LAB — WET PREP, GENITAL
CLUE CELLS WET PREP: NONE SEEN
TRICH WET PREP: NONE SEEN
YEAST WET PREP: NONE SEEN

## 2015-12-04 LAB — POCT PREGNANCY, URINE: PREG TEST UR: POSITIVE — AB

## 2015-12-04 LAB — ABO/RH: ABO/RH(D): A POS

## 2015-12-04 LAB — HCG, QUANTITATIVE, PREGNANCY: HCG, BETA CHAIN, QUANT, S: 1340 m[IU]/mL — AB (ref <5)

## 2015-12-04 MED ORDER — TERCONAZOLE 0.8 % VA CREA: 1.0000 | TOPICAL_CREAM | Freq: Every day | VAGINAL | 0 refills | Status: DC

## 2015-12-04 NOTE — MAU Note (Signed)
Pt stated she started having abd cramping for 2-3 days. Pain comes about every 30 min and makes her go down to the floor. LMP was 10/27 and lasted almost 2 weeks.Denies vag bleeding or discharge at this time.

## 2015-12-04 NOTE — Discharge Instructions (Signed)
Abdominal Pain During Pregnancy  Abdominal pain is common in pregnancy. Most of the time, it does not cause harm. There are many causes of abdominal pain. Some causes are more serious than others and sometimes the cause is not known. Abdominal pain can be a sign that something is very wrong with the pregnancy or the pain may have nothing to do with the pregnancy. Always tell your health care provider if you have any abdominal pain.  Follow these instructions at home:  · Do not have sex or put anything in your vagina until your symptoms go away completely.  · Watch your abdominal pain for any changes.  · Get plenty of rest until your pain improves.  · Drink enough fluid to keep your urine clear or pale yellow.  · Take over-the-counter or prescription medicines only as told by your health care provider.  · Keep all follow-up visits as told by your health care provider. This is important.  Contact a health care provider if:  · You have a fever.  · Your pain gets worse or you have cramping.  · Your pain continues after resting.  Get help right away if:  · You are bleeding, leaking fluid, or passing tissue from the vagina.  · You have vomiting or diarrhea that does not go away.  · You have painful or bloody urination.  · You notice a decrease in your baby's movements.  · You feel very weak or faint.  · You have shortness of breath.  · You develop a severe headache with abdominal pain.  · You have abnormal vaginal discharge with abdominal pain.  This information is not intended to replace advice given to you by your health care provider. Make sure you discuss any questions you have with your health care provider.  Document Released: 12/21/2004 Document Revised: 10/02/2015 Document Reviewed: 07/20/2012  Elsevier Interactive Patient Education © 2017 Elsevier Inc.

## 2015-12-04 NOTE — MAU Provider Note (Signed)
History     CSN: [id number]  Arrival date and time: 12/04/15 [id number]    First Provider Initiated Contact with Patient 12/04/15 1050      Chief Complaint  Patient presents with  . Abdominal Cramping   HPI Barbara A Mayford Knifeurner is a 20 y.o. G1P0000 at 2025w6d by LMP who presents with abdominal pain. Pain started 3 days ago. Describes intermittent sharp lower abdominal pain. Rates 10/10. Has not treated. Nothing makes better or worse. Endorses thick white vaginal discharge & irritation x 1 week. Has used monistat 1 day treatment 3 days ago without relief. Some nausea; no vomiting. Denies fever, diarrhea, constipation, dysuria, or vaginal bleeding. LMP 10/27; lasted for 2 weeks.   OB History    Gravida Para Term Preterm AB Living   1 0 0 0 0 0   SAB TAB Ectopic Multiple Live Births   0 0 0 0 0      Past Medical History:  Diagnosis Date  . Asthma   . Dysmenorrhea    Tired OCPs in past X2 but reports worsening of dysmenorrhea and worsening of clots  . Genital herpes     Past Surgical History:  Procedure Laterality Date  . TOOTH EXTRACTION      Family History  Problem Relation Age of Onset  . Diabetes Other   . Cancer Other     Social History  Substance Use Topics  . Smoking status: Never Smoker  . Smokeless tobacco: Never Used  . Alcohol use No    Allergies: No Known Allergies  Prescriptions Prior to Admission  Medication Sig Dispense Refill Last Dose  . albuterol (PROVENTIL HFA;VENTOLIN HFA) 108 (90 BASE) MCG/ACT inhaler Inhale 2 puffs into the lungs every 4 (four) hours as needed for wheezing or shortness of breath. 1 Inhaler 0 rescue at Unknown time  . HYDROcodone-acetaminophen (NORCO/VICODIN) 5-325 MG tablet Take 1-2 tablets by mouth every 4 (four) hours as needed for severe pain. 10 tablet 0   . methocarbamol (ROBAXIN) 500 MG tablet Take 1 tablet (500 mg total) by mouth 2 (two) times daily. 20 tablet 0     Review of Systems  Constitutional: Negative.    Gastrointestinal: Positive for abdominal pain and nausea. Negative for constipation, diarrhea and vomiting.  Genitourinary: Negative for dysuria.       + vaginal discharge & irritation No vaginal bleeding   Physical Exam   Blood pressure 131/71, pulse 62, temperature 98.1 F (36.7 C), temperature source Oral, resp. rate 18, height 5\' 3"  (1.6 m), weight 118 lb (53.5 kg), last menstrual period 10/31/2015.  Physical Exam  Nursing note and vitals reviewed. Constitutional: She is oriented to person, place, and time. She appears well-developed and well-nourished. No distress.  HENT:  Head: Normocephalic and atraumatic.  Eyes: Conjunctivae are normal. Right eye exhibits no discharge. Left eye exhibits no discharge. No scleral icterus.  Neck: Normal range of motion.  Cardiovascular: Normal rate, regular rhythm and normal heart sounds.   No murmur heard. Respiratory: Effort normal and breath sounds normal. No respiratory distress. She has no wheezes.  GI: Soft. Bowel sounds are normal. She exhibits no distension. There is tenderness in the right lower quadrant, suprapubic area and left lower quadrant. There is no rebound and no guarding.  Genitourinary: Uterus is tender. Uterus is not deviated and not enlarged. Cervix exhibits no motion tenderness and no friability. Right adnexum displays tenderness. Right adnexum displays no mass. Left adnexum displays tenderness. Left adnexum displays no mass. No bleeding in  the vagina. Vaginal discharge (moderate amount of thick white clumpy discharge) found.  Genitourinary Comments: Cervix closed  Neurological: She is alert and oriented to person, place, and time.  Skin: Skin is warm and dry. She is not diaphoretic.  Psychiatric: She has a normal mood and affect. Her behavior is normal. Judgment and thought content normal.    MAU Course  Procedures Results for orders placed or performed during the hospital encounter of 12/04/15 (from the past 24 hour(s))   Urinalysis, Routine w reflex microscopic (not at [facility])     Status: Abnormal   Collection Time: 12/04/15 10:32 AM  Result Value Ref Range   Color, Urine YELLOW YELLOW   APPearance CLEAR CLEAR   Specific Gravity, Urine 1.025 1.005 - 1.030   pH 6.0 5.0 - 8.0   Glucose, UA NEGATIVE NEGATIVE mg/dL   Hgb urine dipstick SMALL (A) NEGATIVE   Bilirubin Urine NEGATIVE NEGATIVE   Ketones, ur 40 (A) NEGATIVE mg/dL   Protein, ur NEGATIVE NEGATIVE mg/dL   Nitrite NEGATIVE NEGATIVE   Leukocytes, UA NEGATIVE NEGATIVE  Urine microscopic-add on     Status: Abnormal   Collection Time: 12/04/15 10:32 AM  Result Value Ref Range   Squamous Epithelial / LPF 0-5 (A) NONE SEEN   WBC, UA 0-5 0 - 5 WBC/hpf   RBC / HPF NONE SEEN 0 - 5 RBC/hpf   Bacteria, UA NONE SEEN NONE SEEN  Pregnancy, urine POC     Status: Abnormal   Collection Time: 12/04/15 10:42 AM  Result Value Ref Range   Preg Test, Ur POSITIVE (A) NEGATIVE  Wet prep, genital     Status: Abnormal   Collection Time: 12/04/15 11:00 AM  Result Value Ref Range   Yeast Wet Prep HPF POC NONE SEEN NONE SEEN   Trich, Wet Prep NONE SEEN NONE SEEN   Clue Cells Wet Prep HPF POC NONE SEEN NONE SEEN   WBC, Wet Prep HPF POC MODERATE BACTERIA SEEN (A) NONE SEEN   Sperm NOT DONE   CBC     Status: None   Collection Time: 12/04/15 11:36 AM  Result Value Ref Range   WBC 8.9 4.0 - 10.5 K/uL   RBC 4.17 3.87 - 5.11 MIL/uL   Hemoglobin 14.0 12.0 - 15.0 g/dL   HCT 16.1 09.6 - 04.5 %   MCV 94.0 78.0 - 100.0 fL   MCH 33.6 26.0 - 34.0 pg   MCHC 35.7 30.0 - 36.0 g/dL   RDW 40.9 81.1 - 91.4 %   Platelets 153 150 - 400 K/uL  ABO/Rh     Status: None (Preliminary result)   Collection Time: 12/04/15 11:36 AM  Result Value Ref Range   ABO/RH(D) A POS   hCG, quantitative, pregnancy     Status: Abnormal   Collection Time: 12/04/15 11:36 AM  Result Value Ref Range   hCG, Beta Chain, Quant, S 1,340 (H) <5 mIU/mL   US Ob Comp Less 14 Wks  Result Date:  12/04/2015 CLINICAL DATA:  Pregnant patient with intermittent abdominal pain and cramping. EXAM: OBSTETRIC <14 WK ULTRASOUND TECHNIQUE: Transabdominal ultrasound was performed for evaluation of the gestation as well as the maternal uterus and adnexal regions. COMPARISON:  None. FINDINGS: Intrauterine gestational sac: None Yolk sac:  Not Visualized. Embryo:  Not Visualized. Cardiac Activity: Not Visualized. Subchorionic hemorrhage:  None visualized. Maternal uterus/adnexae: Normal right and left ovaries. Probable corpus luteum within the left ovary. Trace free fluid in the pelvis. IMPRESSION: No intrauterine gestation identified. In  the setting of positive pregnancy test and no definite intrauterine pregnancy, this reflects a pregnancy of unknown location. Differential considerations include early normal IUP, abnormal IUP, or nonvisualized ectopic pregnancy. Differentiation is achieved with serial beta HCG supplemented by repeat sonography as clinically warranted. Electronically Signed   By: Annia Beltrew  Davis M.D.   On: 12/04/2015 13:06   [place] Ob Transvaginal  Result Date: 12/04/2015 CLINICAL DATA:  Pregnant patient with intermittent abdominal pain and cramping. EXAM: OBSTETRIC <14 WK ULTRASOUND TECHNIQUE: Transabdominal ultrasound was performed for evaluation of the gestation as well as the maternal uterus and adnexal regions. COMPARISON:  None. FINDINGS: Intrauterine gestational sac: None Yolk sac:  Not Visualized. Embryo:  Not Visualized. Cardiac Activity: Not Visualized. Subchorionic hemorrhage:  None visualized. Maternal uterus/adnexae: Normal right and left ovaries. Probable corpus luteum within the left ovary. Trace free fluid in the pelvis. IMPRESSION: No intrauterine gestation identified. In the setting of positive pregnancy test and no definite intrauterine pregnancy, this reflects a pregnancy of unknown location. Differential considerations include early normal IUP, abnormal IUP, or nonvisualized ectopic  pregnancy. Differentiation is achieved with serial beta HCG supplemented by repeat sonography as clinically warranted. Electronically Signed   By: Annia Beltrew  Davis M.D.   On: 12/04/2015 13:06     MDM +UPT UA, wet prep, GC/chlamydia, CBC, ABO/Rh, quant hCG, HIV, and US today to rule out ectopic pregnancy Ultrasound shows no IUP or adnexal mass; BHCG 1340 -- will have pt return in 48 hrs for f/u bhcg This abdominal pain could represent a normal pregnancy, spontaneous abortion, or even an ectopic pregnancy which can be life-threatening. Cultures were obtained to rule out pelvic infection.    Assessment and Plan  A:  1. Pregnancy of unknown anatomic location   2. Abdominal pain affecting pregnancy   3. Vaginal yeast infection    P: Discharge home Rx terazol F/u in 48 hrs in MAU for BHCG Discussed reasons to return to MAU including ectopic precautions GC/CT pending  Judeth Hornrin Dimitry Holsworth 12/04/2015, 10:50 AM

## 2015-12-05 LAB — HIV ANTIBODY (ROUTINE TESTING W REFLEX): HIV SCREEN 4TH GENERATION: NONREACTIVE

## 2015-12-05 LAB — GC/CHLAMYDIA PROBE AMP (~~LOC~~) NOT AT ARMC
Chlamydia: NEGATIVE
Neisseria Gonorrhea: NEGATIVE

## 2015-12-06 ENCOUNTER — Inpatient Hospital Stay (HOSPITAL_COMMUNITY)
Admission: AD | Admit: 2015-12-06 | Discharge: 2015-12-06 | Disposition: A | Discharge status: Home / Self Care | Payer: Medicaid Other | Source: Ambulatory Visit | Attending: Obstetrics & Gynecology | Admitting: Obstetrics & Gynecology

## 2015-12-06 ENCOUNTER — Inpatient Hospital Stay (HOSPITAL_COMMUNITY): Payer: Medicaid Other

## 2015-12-06 DIAGNOSIS — R102 Pelvic and perineal pain: Secondary | ICD-10-CM | POA: Insufficient documentation

## 2015-12-06 DIAGNOSIS — O208 Other hemorrhage in early pregnancy: Secondary | ICD-10-CM | POA: Diagnosis not present

## 2015-12-06 DIAGNOSIS — O26891 Other specified pregnancy related conditions, first trimester: Secondary | ICD-10-CM

## 2015-12-06 DIAGNOSIS — Z3A01 Less than 8 weeks gestation of pregnancy: Secondary | ICD-10-CM | POA: Insufficient documentation

## 2015-12-06 DIAGNOSIS — R109 Unspecified abdominal pain: Secondary | ICD-10-CM

## 2015-12-06 DIAGNOSIS — O3680X0 Pregnancy with inconclusive fetal viability, not applicable or unspecified: Secondary | ICD-10-CM

## 2015-12-06 LAB — HCG, QUANTITATIVE, PREGNANCY: hCG, Beta Chain, Quant, S: 4509 m[IU]/mL — ABNORMAL HIGH (ref <5)

## 2015-12-06 NOTE — MAU Note (Signed)
Denies vaginal bleeding still having pain which now is constant, took Tylenol last night, no pain right now.

## 2015-12-06 NOTE — MAU Provider Note (Signed)
History   G1 @ 5.1 wks per u/s done today. In for repeat quant and vag probe us. Denies any pain at present time. States does have sharp intermitant abd pains that is relieved by tylenol.    CSN: [id number]  Arrival date & time 12/06/15  1245   None     Chief Complaint  Patient presents with  . Follow-up    HPI  Past Medical History:  Diagnosis Date  . Asthma   . Dysmenorrhea    Tired OCPs in past X2 but reports worsening of dysmenorrhea and worsening of clots  . Genital herpes     Past Surgical History:  Procedure Laterality Date  . TOOTH EXTRACTION      Family History  Problem Relation Age of Onset  . Diabetes Other   . Cancer Other     Social History  Substance Use Topics  . Smoking status: Never Smoker  . Smokeless tobacco: Never Used  . Alcohol use No    OB History    Gravida Para Term Preterm AB Living   1 0 0 0 0 0   SAB TAB Ectopic Multiple Live Births   0 0 0 0 0      Review of Systems  Constitutional: Negative.   HENT: Negative.   Eyes: Negative.   Respiratory: Negative.   Cardiovascular: Negative.   Gastrointestinal: Negative.   Endocrine: Negative.   Genitourinary: Negative.   Musculoskeletal: Negative.   Skin: Negative.   Allergic/Immunologic: Negative.   Neurological: Negative.   Hematological: Negative.   Psychiatric/Behavioral: Negative.     Allergies  Patient has no known allergies.  Home Medications    BP 124/75 (BP Location: Right Arm)   Pulse 83   Temp 98.1 F (36.7 C) (Oral)   Resp (!) 118   LMP 10/31/2015   Physical Exam  Constitutional: She is oriented to person, place, and time. She appears well-developed and well-nourished.  HENT:  Head: Normocephalic.  Eyes: Pupils are equal, round, and reactive to light.  Neck: Normal range of motion.  Cardiovascular: Normal rate and normal heart sounds.   Pulmonary/Chest: Effort normal and breath sounds normal.  Musculoskeletal: Normal range of motion.  Neurological:  She is alert and oriented to person, place, and time. She has normal reflexes.  Skin: Skin is warm and dry.  Psychiatric: She has a normal mood and affect. Her behavior is normal. Judgment and thought content normal.    MAU Course  Procedures (including critical care time)  Labs Reviewed  HCG, QUANTITATIVE, PREGNANCY - Abnormal; Notable for the following:       Result Value   hCG, Beta Chain, Quant, S 4,509 (*)    All other components within normal limits   [place] Ob Transvaginal  Result Date: 12/06/2015 CLINICAL DATA:  Pelvic pain and cramping for 1 week. Gestational age by LMP of 5 weeks 1 day. EXAM: TRANSVAGINAL OB ULTRASOUND TECHNIQUE: Transvaginal ultrasound was performed for complete evaluation of the gestation as well as the maternal uterus, adnexal regions, and pelvic cul-de-sac. COMPARISON:  12/04/2015 FINDINGS: Intrauterine gestational sac: Single Yolk sac:  Not Visualized. Embryo:  Not Visualized. MSD: 5  mm   5 w   1  d Subchorionic hemorrhage: Small to moderate subchorionic hemorrhage demonstrated. Maternal uterus/adnexae: Both ovaries are normal in appearance. No mass or abnormal free fluid visualized. IMPRESSION: Single early approximately 5 week intrauterine gestational sac, but no yolk sac, fetal pole, or cardiac activity yet visualized. Recommend follow-up quantitative B-HCG levels  and follow-up US in 14 days to assess viability. This recommendation follows SRU consensus guidelines: Diagnostic Criteria for Nonviable Pregnancy Early in the First Trimester. Malva Limes Engl J Med 2013; 308:6578-46; 369:1443-51. Small to moderate subchorionic hemorrhage. Electronically Signed   By: Myles RosenthalJohn  Stahl M.D.   On: 12/06/2015 15:28     No diagnosis found.    MDM  Discussed with pt labs findings and us findings and that she would need a follow up us is 2 weeks. Pt verbalized understanding. Pt d/c home to f/u in two weeks for repeat us

## 2015-12-19 ENCOUNTER — Ambulatory Visit: Payer: Self-pay | Admitting: *Deleted

## 2015-12-19 ENCOUNTER — Ambulatory Visit (HOSPITAL_COMMUNITY)
Admission: RE | Admit: 2015-12-19 | Discharge: 2015-12-19 | Disposition: A | Discharge status: Home / Self Care | Payer: Medicaid Other | Source: Ambulatory Visit | Attending: Obstetrics and Gynecology | Admitting: Obstetrics and Gynecology

## 2015-12-19 DIAGNOSIS — O3680X0 Pregnancy with inconclusive fetal viability, not applicable or unspecified: Secondary | ICD-10-CM

## 2015-12-19 DIAGNOSIS — Z3A01 Less than 8 weeks gestation of pregnancy: Secondary | ICD-10-CM | POA: Diagnosis not present

## 2015-12-19 DIAGNOSIS — Z712 Person consulting for explanation of examination or test findings: Secondary | ICD-10-CM

## 2015-12-19 DIAGNOSIS — O219 Vomiting of pregnancy, unspecified: Secondary | ICD-10-CM

## 2015-12-19 DIAGNOSIS — O208 Other hemorrhage in early pregnancy: Secondary | ICD-10-CM | POA: Diagnosis not present

## 2015-12-19 MED ORDER — DOXYLAMINE-PYRIDOXINE 10-10 MG PO TBEC: 1.0000 | DELAYED_RELEASE_TABLET | Freq: Three times a day (TID) | ORAL | 1 refills | Status: AC | PRN

## 2015-12-19 MED ORDER — DOXYLAMINE-PYRIDOXINE 10-10 MG PO TBEC: 1.0000 | DELAYED_RELEASE_TABLET | Freq: Four times a day (QID) | ORAL | 1 refills | Status: DC

## 2015-12-19 NOTE — Progress Notes (Signed)
Patient presents for results of u/s. Reviewed pt hx and u/s results with Dr Jolayne Pantheronstant. Recommended starting PNV and initiating PNC. U/s photo given to patient. Patient voiced understanding.  Patient also c/o of constant severe nausea, requests prescription to help with this. Dr Jolayne Pantheronstant ordered diclegis, which is sent to pharmacy. Will fax medicaid preauthorization form.

## 2015-12-19 NOTE — Addendum Note (Signed)
Addended by: Garret ReddishBARNES, Joseantonio Dittmar M on: 12/19/2015 02:32 PM   Modules accepted: Orders

## 2015-12-22 ENCOUNTER — Telehealth: Payer: Self-pay | Admitting: *Deleted

## 2015-12-22 DIAGNOSIS — O210 Mild hyperemesis gravidarum: Secondary | ICD-10-CM

## 2015-12-22 NOTE — Telephone Encounter (Signed)
Received a voicemessage from [facility] 12/19/15 pm stating they got a prescription for Diclegis, but this patient does not have insurance and it cost over $400. Want to know if we can suggest an alternate.

## 2015-12-23 MED ORDER — PROMETHAZINE HCL 12.5 MG PO TABS: 12.5000 mg | ORAL_TABLET | Freq: Three times a day (TID) | ORAL | 0 refills | Status: DC | PRN

## 2015-12-23 NOTE — Telephone Encounter (Signed)
Discussed with Dr. Genevie AnnSchenk and called patient and informed her she can either take Unisom 25mg  po and Vitamin B6 25 mg TID prn or Phenergan 12.5 mg po TID prn for her nausea/vomiting. She requested I send in rx for phenergan and she will decide which one she can afford better. Reviewed her appt in January with her.

## 2015-12-23 NOTE — Telephone Encounter (Signed)
Beth Carr left a  Voicemail yesterday afternoon stating she was given prescription for nausea but her insurance hasn't gone thru and it cost $400 and she needs a different medicine.

## 2016-01-01 ENCOUNTER — Other Ambulatory Visit: Payer: Self-pay | Admitting: Obstetrics and Gynecology

## 2016-01-01 DIAGNOSIS — O210 Mild hyperemesis gravidarum: Secondary | ICD-10-CM

## 2016-01-02 ENCOUNTER — Telehealth: Payer: Self-pay

## 2016-01-02 NOTE — Telephone Encounter (Signed)
Pt called and requested a refill on her phenergan.  Provider states that she can have a refill on her medication.  Pt notified to go pick up medication [facility] off [address].

## 2016-01-05 NOTE — L&D Delivery Note (Signed)
Patient is a 21 y.o. now G1P1001 who admitted for IOL for pre-eclampsia, s/p cytotec, FB, Pitocin; AROM 08/06/16 at 1444; now s/p NSVD at 5912w1d.  Delivery Note At 1:14 PM a viable female was delivered via Vaginal, Spontaneous Delivery. Presentation: LOA APGAR: 8, 9; weight  pending Placenta status: intact; to L&D   Cord: 3-vessel  Anesthesia:  Epidural Episiotomy: None Lacerations: Labial Suture Repair: 3.0 monocryl Est. Blood Loss (mL):  350  Head delivered LOA. No nuchal cord present. Shoulder and body delivered in usual fashion. Infant to mother's abdomen. Cord clamped x 2 after 1-minute delay, and cut by family member. Cord blood drawn. Placenta delivered spontaneously with gentle cord traction. Fundus firm with massage and Pitocin. Perineum inspected and found to have no perineal laceration, had left labial laceration which was repaired with with good hemostasis achieved.  Mom to postpartum.  Baby to Couplet care / Skin to Skin.  Raynelle FanningJulie P. Degele, MD OB Fellow 08/07/16, 1:45 PM

## 2016-01-06 ENCOUNTER — Telehealth: Payer: Self-pay | Admitting: *Deleted

## 2016-01-06 NOTE — Telephone Encounter (Signed)
I called Beth Carr and left a message we are returning her call and are working on her request and that she will get a phone call from the company verifying her request. Call us if any other questions.

## 2016-01-06 NOTE — Telephone Encounter (Signed)
Prescription for diclegis form started but need insurance information- front office not able to verify her medicaid. Called Cassie and left message we need her to call us back with her insurance number.  We also need to explain to her how to take the diclegis- 2 tabs at bedtime for a few days, if that does not help her nausea and vomiting add 1 pill in am ,if after a few days still does not relieve her nausea may add one more tab in pm for total of up to 4 tablets a day. Need to explain may make her sleepy at first, but as her body adjusts should not make her as sleepy.

## 2016-01-06 NOTE — Telephone Encounter (Signed)
Beth Carr left a voice message 01/03/16 am stating she got her 2nd phenergan refill. States she is 2 months pregnant and the phenergan makes her constantly sleep but she doesn't feel it really helps her from throwing up, just makes her sleepy. States she had a prescription for diclegis but she didn't have medicaid and couldn't afford the $400; now she has medicaid and wants to go thru the process to get diclegis approved.

## 2016-01-07 NOTE — Telephone Encounter (Signed)
Pt left message yesterday @ 1142 stating that she wants to switch to Diclegis and has been approved for Medicaid. Her ID # is [id number].

## 2016-01-08 NOTE — Telephone Encounter (Signed)
Diclegis form completed and faxed 01/07/16.

## 2016-01-12 NOTE — Telephone Encounter (Addendum)
Message received on 1/5 from pharmacy [facility] [phone] stating that they need to verify the quantity of refills for Diclegis as well as the collaborating physician who Leftwich Craige CottaKirby works with.  I returned the call to [facility] on 1/9 and spoke with pharmacist. She needed to know the quantity of tablets, number of refills and collaborating physician. I advised #120 tablets, 2 refills and Dr. Catalina AntiguaPeggy Constant is the collaborating physician. No further questions or information needed.

## 2016-01-21 ENCOUNTER — Encounter: Payer: Self-pay | Admitting: Certified Nurse Midwife

## 2016-01-27 ENCOUNTER — Other Ambulatory Visit (HOSPITAL_COMMUNITY)
Admission: RE | Admit: 2016-01-27 | Discharge: 2016-01-27 | Disposition: A | Discharge status: Home / Self Care | Payer: Medicaid Other | Source: Ambulatory Visit | Attending: Family Medicine | Admitting: Family Medicine

## 2016-01-27 ENCOUNTER — Telehealth: Payer: Self-pay | Admitting: *Deleted

## 2016-01-27 ENCOUNTER — Ambulatory Visit (INDEPENDENT_AMBULATORY_CARE_PROVIDER_SITE_OTHER): Payer: Medicaid Other | Admitting: Family Medicine

## 2016-01-27 ENCOUNTER — Encounter: Payer: Self-pay | Admitting: Family Medicine

## 2016-01-27 VITALS — BP 112/73 | HR 90 | Wt 118.9 lb

## 2016-01-27 DIAGNOSIS — Z01411 Encounter for gynecological examination (general) (routine) with abnormal findings: Secondary | ICD-10-CM | POA: Insufficient documentation

## 2016-01-27 DIAGNOSIS — Z3401 Encounter for supervision of normal first pregnancy, first trimester: Secondary | ICD-10-CM

## 2016-01-27 DIAGNOSIS — Z3490 Encounter for supervision of normal pregnancy, unspecified, unspecified trimester: Secondary | ICD-10-CM | POA: Insufficient documentation

## 2016-01-27 DIAGNOSIS — Z113 Encounter for screening for infections with a predominantly sexual mode of transmission: Secondary | ICD-10-CM | POA: Diagnosis present

## 2016-01-27 DIAGNOSIS — A6009 Herpesviral infection of other urogenital tract: Secondary | ICD-10-CM

## 2016-01-27 DIAGNOSIS — O98311 Other infections with a predominantly sexual mode of transmission complicating pregnancy, first trimester: Secondary | ICD-10-CM

## 2016-01-27 DIAGNOSIS — Z23 Encounter for immunization: Secondary | ICD-10-CM | POA: Diagnosis not present

## 2016-01-27 DIAGNOSIS — O98319 Other infections with a predominantly sexual mode of transmission complicating pregnancy, unspecified trimester: Secondary | ICD-10-CM

## 2016-01-27 DIAGNOSIS — Z3400 Encounter for supervision of normal first pregnancy, unspecified trimester: Secondary | ICD-10-CM

## 2016-01-27 LAB — POCT URINALYSIS DIP (DEVICE)
Bilirubin Urine: NEGATIVE
Glucose, UA: NEGATIVE mg/dL
HGB URINE DIPSTICK: NEGATIVE
KETONES UR: NEGATIVE mg/dL
Leukocytes, UA: NEGATIVE
Nitrite: NEGATIVE
PH: 8.5 — AB (ref 5.0–8.0)
PROTEIN: NEGATIVE mg/dL
SPECIFIC GRAVITY, URINE: 1.015 (ref 1.005–1.030)
Urobilinogen, UA: 0.2 mg/dL (ref 0.0–1.0)

## 2016-01-27 MED ORDER — PRENATAL VITAMINS 0.8 MG PO TABS: 1.0000 | ORAL_TABLET | Freq: Every day | ORAL | 12 refills | Status: DC

## 2016-01-27 MED ORDER — ACYCLOVIR 800 MG PO TABS: 800.0000 mg | ORAL_TABLET | Freq: Two times a day (BID) | ORAL | 3 refills | Status: AC

## 2016-01-27 NOTE — Telephone Encounter (Addendum)
Pt left message stating that she was seen in office this morning. Prescriptions were supposed to be sent to pharmacy for Valtrex and PNV's. When she went to the pharmacy, they did not have Rx for Valtrex. Per chart review, Dr. Karie ChimeraMumaw's note does not include information that Rx for valtrex would be sent. Message sent to Dr. Omer JackMumaw and message left for pt on her personal voice mail stating that we will call back once a response is received from Dr. Omer JackMumaw regarding the prescription.   1/24  0815  Called pt and left message on her personal voice mail stating that a prescription for Acyclovir was sent to her pharmacy yesterday. It works in the same way as Valtrex and is less expensive. Per chart review, the prescription message has been received by her pharmacy and she should be able to pick up the medication today. Please call back if she has additional questions.

## 2016-01-27 NOTE — Patient Instructions (Signed)
SAFE MEDICATIONS IN PREGNANCY  Acne:  Benzoyl Peroxide  Salicylic Acid   Backache/Headache:  Tylenol: 2 regular strength every 4 hours OR        2 Extra strength every 6 hours   Colds/Coughs/Allergies:  Benadryl (alcohol free) 25 mg every 6 hours as needed  Breath right strips  Claritin  Cepacol throat lozenges  Chloraseptic throat spray  Cold-Eeze- up to three times per day  Cough drops, alcohol free  Flonase (by prescription only)  Guaifenesin  Mucinex  Robitussin DM (plain only, alcohol free)  Saline nasal spray/drops  Sudafed (pseudoephedrine) & Actifed * use only after [redacted] weeks gestation and if you do not have high blood pressure  Tylenol  Vicks Vaporub  Zinc lozenges  Zyrtec   Constipation:  Colace  Ducolax suppositories  Fleet enema  Glycerin suppositories  Metamucil  Milk of magnesia  Miralax  Senokot  Smooth move tea   Diarrhea:  Kaopectate  Imodium A-D   *NO pepto Bismol   Hemorrhoids:  Anusol  Anusol HC  Preparation H  Tucks   Indigestion:  Tums  Maalox  Mylanta  Zantac  Pepcid   Insomnia:  Benadryl (alcohol free) 25mg  every 6 hours as needed  Tylenol PM  Unisom, no Gelcaps   Leg Cramps:  Tums  MagGel   Nausea/Vomiting:  Bonine  Dramamine  Emetrol  Ginger extract  Sea bands  Meclizine  Nausea medication to take during pregnancy:  Unisom (doxylamine succinate 25 mg tablets) Take one tablet daily at bedtime. If symptoms are not adequately controlled, the dose can be increased to a maximum recommended dose of two tablets daily (1/2 tablet in the morning, 1/2 tablet mid-afternoon and one at bedtime).  Vitamin B6 100mg  tablets. Take one tablet twice a day (up to 200 mg per day).   Skin Rashes:  Aveeno products  Benadryl cream or 25mg  every 6 hours as needed  Calamine Lotion  1% cortisone cream   Yeast infection:  Gyne-lotrimin 7  Monistat 7    **If taking multiple medications, please check labels to avoid  duplicating the same active ingredients  **take medication as directed on the label  ** Do not exceed 4000 mg of tylenol in 24 hours  **Do not take medications that contain aspirin or ibuprofen          Pregnancy and Genital Herpes Genital herpes is an STD (sexually transmitted disease) that is caused by a virus. An active infection can cause itching, blisters, and sores (lesions) in the genital area or rectal area. This is called an outbreak. Symptoms of genital herpes may last several days and then go away. However, the virus remains in your body, so you may have more outbreaks of symptoms in the future (recurrent infection). Genital herpes is particularly concerning for pregnant women because the virus can be passed to the unborn or newborn baby and cause serious problems. When can the herpes virus be passed to my baby? The virus can be passed to your baby:  Before delivery. The virus can be passed to your unborn baby through the placenta. This is more likely to occur if you get herpes for the first time (primary infection) in the first 3 months of your pregnancy (first trimester). This can possibly cause a miscarriage or birth defects in the baby.  During delivery. This is more likely to occur if you become infected for the first time late in your pregnancy. The virus is less likely to pass to your baby if  you had herpes before you became pregnant. This is because antibodies against the virus develop over a period of time. These antibodies help to protect the baby.  After delivery. As a newborn, your baby can get a herpes infection if you touch active lesions and then touch your baby without washing your hands. Can I take medicines for herpes during pregnancy? Medicines may be prescribed that are safe for a mother and her unborn baby. The medicine can help to reduce symptoms or prevent another outbreak of the infection. If the infection happened before you became pregnant, medicine may be  prescribed in the last 4 weeks of the pregnancy. This can help to prevent a breakout of the infection at the time of delivery. Will herpes affect my delivery? Your baby will likely need to be delivered by cesarean delivery ("C-section") if:  You have an active, recurrent, or new herpes outbreak at the time of delivery. This is because the virus can pass to the baby through an infected birth canal. This can cause severe problems for the baby.  You have any signs or symptoms of the infection being present in the genital area, even if you do not have any visible lesions in the birth canal. Other symptoms may include genital pain, burning, and itching.  You develop the infection for the first time late in your pregnancy. Your health care provider may recommend a cesarean delivery because your body has not had the time to build up enough antibodies against the virus to protect the baby from getting the infection. You will likely be able to have a vaginal delivery if you had a first-time herpes infection before your third trimester and you have no evidence of an outbreak when you go into labor. Can I breastfeed my baby? A woman who is infected with genital herpes can breastfeed her baby. The virus will not be present in the breast milk. If lesions are present on a breast, the baby should not breastfeed from the affected breast. How can I avoid passing herpes to my baby after delivery? Take these actions that can help you to avoid passing the virus to your baby:  Wash your hands with soap and water often and before touching your baby.  If you have an outbreak, keep the area clean and covered.  Try to avoid physical and stressful situations that may bring on an outbreak. When should I seek medical care? Seek medical care if you have signs or symptoms of a herpes outbreak at any time during your pregnancy. These signs or symptoms may include:  A rash, blisters, lesions, or ulcers in your genital area  or rectal area.  Burning, itching, or pain in your genital area or rectal area.  Trouble urinating. This information is not intended to replace advice given to you by your health care provider. Make sure you discuss any questions you have with your health care provider. Document Released: 03/29/2000 Document Revised: 05/26/2015 Document Reviewed: 06/10/2014 Elsevier Interactive Patient Education  2017 [employer].   First Trimester of Pregnancy The first trimester of pregnancy is from week 1 until the end of week 12 (months 1 through 3). During this time, your baby will begin to develop inside you. At 6-8 weeks, the eyes and face are formed, and the heartbeat can be seen on ultrasound. At the end of 12 weeks, all the baby's organs are formed. Prenatal care is all the medical care you receive before the birth of your baby. Make sure you get  good prenatal care and follow all of your doctor's instructions. Follow these instructions at home: Medicines  Take medicine only as told by your doctor. Some medicines are safe and some are not during pregnancy.  Take your prenatal vitamins as told by your doctor.  Take medicine that helps you poop (stool softener) as needed if your doctor says it is okay. Diet  Eat regular, healthy meals.  Your doctor will tell you the amount of weight gain that is right for you.  Avoid raw meat and uncooked cheese.  If you feel sick to your stomach (nauseous) or throw up (vomit):  Eat 4 or 5 small meals a day instead of 3 large meals.  Try eating a few soda crackers.  Drink liquids between meals instead of during meals.  If you have a hard time pooping (constipation):  Eat high-fiber foods like fresh vegetables, fruit, and whole grains.  Drink enough fluids to keep your pee (urine) clear or pale yellow. Activity and Exercise  Exercise only as told by your doctor. Stop exercising if you have cramps or pain in your lower belly (abdomen) or low  back.  Try to avoid standing for long periods of time. Move your legs often if you must stand in one place for a long time.  Avoid heavy lifting.  Wear low-heeled shoes. Sit and stand up straight.  You can have sex unless your doctor tells you not to. Relief of Pain or Discomfort  Wear a good support bra if your breasts are sore.  Take warm water baths (sitz baths) to soothe pain or discomfort caused by hemorrhoids. Use hemorrhoid cream if your doctor says it is okay.  Rest with your legs raised if you have leg cramps or low back pain.  Wear support hose if you have puffy, bulging veins (varicose veins) in your legs. Raise (elevate) your feet for 15 minutes, 3-4 times a day. Limit salt in your diet. Prenatal Care  Schedule your prenatal visits by the twelfth week of pregnancy.  Write down your questions. Take them to your prenatal visits.  Keep all your prenatal visits as told by your doctor. Safety  Wear your seat belt at all times when driving.  Make a list of emergency phone numbers. The list should include numbers for family, friends, the hospital, and police and fire departments. General Tips  Ask your doctor for a referral to a local prenatal class. Begin classes no later than at the start of month 6 of your pregnancy.  Ask for help if you need counseling or help with nutrition. Your doctor can give you advice or tell you where to go for help.  Do not use hot tubs, steam rooms, or saunas.  Do not douche or use tampons or scented sanitary pads.  Do not cross your legs for long periods of time.  Avoid litter boxes and soil used by cats.  Avoid all smoking, herbs, and alcohol. Avoid drugs not approved by your doctor.  Do not use any tobacco products, including cigarettes, chewing tobacco, and electronic cigarettes. If you need help quitting, ask your doctor. You may get counseling or other support to help you quit.  Visit your dentist. At home, brush your teeth with  a soft toothbrush. Be gentle when you floss. Get help if:  You are dizzy.  You have mild cramps or pressure in your lower belly.  You have a nagging pain in your belly area.  You continue to feel sick to your stomach,  throw up, or have watery poop (diarrhea).  You have a bad smelling fluid coming from your vagina.  You have pain with peeing (urination).  You have increased puffiness (swelling) in your face, hands, legs, or ankles. Get help right away if:  You have a fever.  You are leaking fluid from your vagina.  You have spotting or bleeding from your vagina.  You have very bad belly cramping or pain.  You gain or lose weight rapidly.  You throw up blood. It may look like coffee grounds.  You are around people who have [place] measles, fifth disease, or chickenpox.  You have a very bad headache.  You have shortness of breath.  You have any kind of trauma, such as from a fall or a car accident. This information is not intended to replace advice given to you by your health care provider. Make sure you discuss any questions you have with your health care provider. Document Released: 06/09/2007 Document Revised: 05/29/2015 Document Reviewed: 10/31/2012 Elsevier Interactive Patient Education  2017 [employer].

## 2016-01-27 NOTE — Progress Notes (Signed)
Prenatal education packet given Breastfeeding tip reviewed Initial prenatal labs/pap today

## 2016-01-27 NOTE — Progress Notes (Signed)
New OB Note  01/27/2016   Transfer of Care Patient: no  History of Present Illness: Beth Carr is a 21 y.o. G1P0000 @ 12.4 weeks (EDC 08/07/14, based on LMP Patient's last menstrual period was 10/31/2015 (exact date).), with the above CC. Preg complicated by  does not have a problem list on file.    Her periods were: regular periods every 28 days She was using no method when she conceived.  She has Positive signs or symptoms of nausea/vomiting of pregnancy. She has Negative signs or symptoms of miscarriage or preterm labor She identifies Negative Zika risk factors for her and her partner  ROS: A 12-point review of systems was performed and negative, except as stated in the above HPI.  OBGYN History: As per HPI. OB History  Gravida Para Term Preterm AB Living  1 0 0 0 0 0  SAB TAB Ectopic Multiple Live Births  0 0 0 0 0    # Outcome Date GA Lbr Len/2nd Weight Sex Delivery Anes PTL Lv  1 Current               Any prior children are healthy, doing well, without any problems or issues: not applicable History of pap smears: No. Last pap smear N/A. Abnormal: not applicable  History of STIs: Yes, genital herpes. Last outbreak 2-3 weeks ago.   Past Medical History: Past Medical History:  Diagnosis Date  . Asthma   . Dysmenorrhea    Tired OCPs in past X2 but reports worsening of dysmenorrhea and worsening of clots  . Genital herpes     Past Surgical History: Past Surgical History:  Procedure Laterality Date  . NO PAST SURGERIES    . TOOTH EXTRACTION      Family History:  Family History  Problem Relation Age of Onset  . Hypertension Mother     gestational  . Cancer Other   . Diabetes Maternal Grandfather    She denies any female cancers, bleeding or blood clotting disorders.  She denies any history of mental retardation, birth defects or genetic disorders in her or the FOB's history  Social History:  Social History   Social History  . Marital status: Single   Spouse name: N/A  . Number of children: N/A  . Years of education: N/A   Occupational History  . Not on file.   Social History Main Topics  . Smoking status: Never Smoker  . Smokeless tobacco: Never Used  . Alcohol use No  . Drug use: No  . Sexual activity: Not Currently    Birth control/ protection: None   Other Topics Concern  . Not on file   Social History Narrative   Lives with Celine Ahr in [place] with younger brother and sister.  Mother lives in [place].     Graduated early from home school.  Going to community college starting Fall 2013.   Any pets in the household: no    Allergy: No Known Allergies  Health Maintenance:  Mammogram Up to Date: not applicable  Current Outpatient Medications: Tylenol prn  Physical Exam:   BP 112/73   Pulse 90   Wt 118 lb 14.4 oz (53.9 kg)   LMP 10/31/2015 (Exact Date)   BMI 21.06 kg/m  Body mass index is 21.06 kg/m. Fundal height: not applicable FHTs: 164  General appearance: Well nourished, well developed female in no acute distress.  Neck:  Supple, normal appearance, and no thyromegaly  Cardiovascular: S1, S2 normal, no murmur, rub or  gallop, regular rate and rhythm Respiratory:  Clear to auscultation bilateral. Normal respiratory effort Abdomen: positive bowel sounds and no masses, hernias; diffusely non tender to palpation, non distended Breasts: breasts appear normal, no suspicious masses, no skin or nipple changes or axillary nodes. Neuro/Psych:  Normal mood and affect.  Skin:  Warm and dry.  Lymphatic:  No inguinal lymphadenopathy.   Pelvic exam: is not limited by body habitus EGBUS: within normal limits, Vagina: within normal limits and with no blood in the vault, Cervix: normal appearing cervix without discharge or lesions, closed/long/high, primiparous cervix. Uterus:  nonenlarged, and Adnexa:  normal adnexa   Assessment/Plan: 1. Encounter for supervision of normal first pregnancy in first trimester - Flu  Vaccine QUAD 36+ mos IM (Fluarix, Quad PF)  2. Supervision of normal first pregnancy, antepartum - US MFM Fetal Nuchal Translucency; Future - Prenatal Profile - Prenatal Multivit-Min-Fe-FA (PRENATAL VITAMINS) 0.8 MG tablet; Take 1 tablet by mouth daily.  Dispense: 30 tablet; Refill: 12 - Cytology - PAP - Cystic fibrosis diagnostic study - Hemoglobinopathy Evaluation - Culture, OB Urine - Pain Mgmt, Profile 6 Conf w/o mM, U  3. Genital herpes affecting pregnancy in first trimester - Prophylaxis treatment discussed in third trimester - Recent outbreak 2 weeks ago, does not have treatment, given Rx for treatment at first symptom outbreak next time. - acyclovir (ZOVIRAX) 800 MG tablet; Take 1 tablet (800 mg total) by mouth 2 (two) times daily. Start taking immediately at first symptom onset.  Dispense: 10 tablet; Refill: 3   Problem list reviewed and updated.  Follow up in 4 weeks.  >50% of 30 min visit spent on counseling and coordination of care.     Cleda ClarksElizabeth W. Esequiel Kleinfelter, DO OB Fellow Center for [facility](Faculty Practice)

## 2016-01-28 ENCOUNTER — Telehealth: Payer: Self-pay | Admitting: *Deleted

## 2016-01-28 ENCOUNTER — Encounter: Payer: Self-pay | Admitting: Student

## 2016-01-28 LAB — PRENATAL PROFILE (SOLSTAS)
ANTIBODY SCREEN: NEGATIVE
BASOS PCT: 0 %
Basophils Absolute: 0 cells/uL (ref 0–200)
Eosinophils Absolute: 84 cells/uL (ref 15–500)
Eosinophils Relative: 1 %
HCT: 40.8 % (ref 35.0–45.0)
HIV 1&2 Ab, 4th Generation: NONREACTIVE
Hemoglobin: 13.3 g/dL (ref 11.7–15.5)
Hepatitis B Surface Ag: NEGATIVE
LYMPHS PCT: 15 %
Lymphs Abs: 1260 cells/uL (ref 850–3900)
MCH: 32.6 pg (ref 27.0–33.0)
MCHC: 32.6 g/dL (ref 32.0–36.0)
MCV: 100 fL (ref 80.0–100.0)
MONOS PCT: 4 %
MPV: 10.5 fL (ref 7.5–12.5)
Monocytes Absolute: 336 cells/uL (ref 200–950)
NEUTROS PCT: 80 %
Neutro Abs: 6720 cells/uL (ref 1500–7800)
PLATELETS: 179 10*3/uL (ref 140–400)
RBC: 4.08 MIL/uL (ref 3.80–5.10)
RDW: 13.2 % (ref 11.0–15.0)
RH TYPE: POSITIVE
Rubella: 1.14 Index — ABNORMAL HIGH (ref <0.90)
WBC: 8.4 10*3/uL (ref 3.8–10.8)

## 2016-01-28 LAB — CULTURE, OB URINE: Organism ID, Bacteria: NO GROWTH

## 2016-01-28 NOTE — Telephone Encounter (Signed)
Message received from [facility] stating that pt's prescription for Diclegis [place]. Please call back once the authorization has been completed.

## 2016-01-29 LAB — HEMOGLOBINOPATHY EVALUATION
HCT: 40.8 % (ref 35.0–45.0)
HGB A2 QUANT: 2.4 % (ref 1.8–3.5)
HGB A: 96.6 % (ref >96.0)
Hemoglobin: 13.3 g/dL (ref 11.7–15.5)
MCH: 32.6 pg (ref 27.0–33.0)
MCV: 100 fL (ref 80.0–100.0)
RBC: 4.08 MIL/uL (ref 3.80–5.10)
RDW: 13.2 % (ref 11.0–15.0)

## 2016-01-29 NOTE — Telephone Encounter (Signed)
Diclegis Request Form completed and faxed.  The company will notify pt.

## 2016-01-30 ENCOUNTER — Ambulatory Visit (HOSPITAL_COMMUNITY)
Admission: RE | Admit: 2016-01-30 | Discharge: 2016-01-30 | Disposition: A | Discharge status: Home / Self Care | Payer: Medicaid Other | Source: Ambulatory Visit | Attending: Family Medicine | Admitting: Family Medicine

## 2016-01-30 ENCOUNTER — Encounter (HOSPITAL_COMMUNITY): Payer: Self-pay

## 2016-01-30 DIAGNOSIS — Z3400 Encounter for supervision of normal first pregnancy, unspecified trimester: Secondary | ICD-10-CM | POA: Diagnosis present

## 2016-01-30 DIAGNOSIS — Z3A13 13 weeks gestation of pregnancy: Secondary | ICD-10-CM | POA: Diagnosis not present

## 2016-01-30 LAB — CYTOLOGY - PAP
ADEQUACY: ABSENT
Chlamydia: NEGATIVE
Diagnosis: NEGATIVE
NEISSERIA GONORRHEA: NEGATIVE

## 2016-01-30 LAB — PAIN MGMT, PROFILE 6 CONF W/O MM, U
6 Acetylmorphine: NEGATIVE ng/mL (ref <10)
Alcohol Metabolites: NEGATIVE ng/mL (ref <500)
Amphetamines: NEGATIVE ng/mL (ref <500)
BARBITURATES: NEGATIVE ng/mL (ref <300)
BENZODIAZEPINES: NEGATIVE ng/mL (ref <100)
COCAINE METABOLITE: NEGATIVE ng/mL (ref <150)
CREATININE: 84.3 mg/dL (ref >=20.0)
METHADONE METABOLITE: NEGATIVE ng/mL (ref <100)
Marijuana Metabolite: 175 ng/mL — ABNORMAL HIGH (ref <5)
Marijuana Metabolite: POSITIVE ng/mL — AB (ref <20)
OPIATES: NEGATIVE ng/mL (ref <100)
OXIDANT: NEGATIVE ug/mL (ref <200)
Oxycodone: NEGATIVE ng/mL (ref <100)
PH: 7.83 (ref 4.5–9.0)
PHENCYCLIDINE: NEGATIVE ng/mL (ref <25)
Please note:: 0

## 2016-02-02 ENCOUNTER — Other Ambulatory Visit (HOSPITAL_COMMUNITY): Payer: Self-pay | Admitting: *Deleted

## 2016-02-02 DIAGNOSIS — Z3689 Encounter for other specified antenatal screening: Secondary | ICD-10-CM

## 2016-02-03 LAB — CFVANTAGE CYSTIC FIB EXP SCREEN

## 2016-02-04 ENCOUNTER — Other Ambulatory Visit: Payer: Self-pay

## 2016-02-24 ENCOUNTER — Encounter: Payer: Self-pay | Admitting: Student

## 2016-02-24 ENCOUNTER — Other Ambulatory Visit: Payer: Self-pay | Admitting: Student

## 2016-02-24 ENCOUNTER — Ambulatory Visit (INDEPENDENT_AMBULATORY_CARE_PROVIDER_SITE_OTHER): Payer: Medicaid Other | Admitting: Student

## 2016-02-24 VITALS — BP 112/62 | HR 76 | Wt 122.5 lb

## 2016-02-24 DIAGNOSIS — A6009 Herpesviral infection of other urogenital tract: Secondary | ICD-10-CM | POA: Diagnosis not present

## 2016-02-24 DIAGNOSIS — O98311 Other infections with a predominantly sexual mode of transmission complicating pregnancy, first trimester: Secondary | ICD-10-CM

## 2016-02-24 DIAGNOSIS — Z3402 Encounter for supervision of normal first pregnancy, second trimester: Secondary | ICD-10-CM

## 2016-02-24 DIAGNOSIS — O98312 Other infections with a predominantly sexual mode of transmission complicating pregnancy, second trimester: Secondary | ICD-10-CM

## 2016-02-24 MED ORDER — VALACYCLOVIR HCL 500 MG PO TABS: 500.0000 mg | ORAL_TABLET | Freq: Two times a day (BID) | ORAL | 1 refills | Status: AC

## 2016-02-24 NOTE — Progress Notes (Addendum)
   PRENATAL VISIT NOTE  Subjective:  Beth Carr is a 21 y.o. G1P0000 at 732w4d being seen today for ongoing prenatal care.  She is currently monitored for the following issues for this low-risk pregnancy and has Supervision of normal pregnancy and Genital herpes affecting pregnancy on her problem list.  Patient reports no complaints.  Contractions: Not present. Vag. Bleeding: None.  Movement: Present. Denies leaking of fluid.   The following portions of the patient's history were reviewed and updated as appropriate: allergies, current medications, past family history, past medical history, past social history, past surgical history and problem list. Problem list updated.  Objective:   Vitals:   02/24/16 0818  BP: 112/62  Pulse: 76  Weight: 122 lb 8 oz (55.6 kg)    Fetal Status: Fetal Heart Rate (bpm): 155   Movement: Present     General:  Alert, oriented and cooperative. Patient is in no acute distress.  Skin: Skin is warm and dry. No rash noted.   Cardiovascular: Normal heart rate noted  Respiratory: Normal respiratory effort, no problems with respiration noted  Abdomen: Soft, gravid, appropriate for gestational age. Pain/Pressure: Absent     Pelvic:  Cervical exam deferred        Extremities: Normal range of motion.  Edema: None  Mental Status: Normal mood and affect. Normal behavior. Normal judgment and thought content.   Assessment and Plan:  Pregnancy: G1P0000 at [redacted]w[redacted]d  1. Encounter for supervision of normal first pregnancy in second trimester Anatomy scan schedule for March 12, 2016  2. Genital herpes affecting pregnancy in first trimester Valtrex RX for outbreaks sent to pharmacy.   Preterm labor symptoms and general obstetric precautions including but not limited to vaginal bleeding, contractions, leaking of fluid and fetal movement were reviewed in detail with the patient. Please refer to After Visit Summary for other counseling recommendations.  Return in about  4 weeks (around 03/23/2016) for LROB.   Marylene LandKathryn Lorraine Andre Gallego, CNM

## 2016-02-24 NOTE — Patient Instructions (Signed)
AREA PEDIATRIC/FAMILY PRACTICE PHYSICIANS  [facility] [address], [address] [place], [place]  [address] Phone - [phone]   Fax - [phone]  [facility] [address] [place], [place] [address] Phone - [phone]   Fax - [phone]  [facility] [address] [place], [place]  [address] Phone - [phone]   Fax - [phone]  [facility] [address] [place], [place]  [address] Phone - [phone]   Fax - [phone]  [facility] [address] [place], [place]  [address] Phone - [phone]   Fax - [phone]  [facility] [address] [place], [place]  [address] Phone - [phone]   Fax - [phone]  [facility] [address] [place], [place]  [address] Phone - [phone]   Fax - [phone]  [facility] [address] [place], [place]  [address] Phone - [phone]   Fax - [phone]  [facility] [address] [place], [place]  [address] Phone - [phone]   Fax - [phone] [facility] [address] [place], [place]  [address] Phone - [phone]   Fax - [phone]  [facility] [address] [place], [place]  [address] Phone - [phone]   Fax - [phone]  [facility] [address] [place], [place]  [address] Phone - [phone]   Fax - [phone]  [facility] [address] [place], [place]  [address] Phone - [phone]   Fax - [phone]  [facility] [address] [place], [place]  [address] Phone - [phone]  [facility] [address] [place], [place]  [address] Phone - [phone]   Fax - [phone]  [facility] [address] [place], [place]  [address] Phone - [phone]   Fax - [phone]  [facility] [address] [place], [place]  [address] Phone - [phone]   Fax - [phone]  [facility] [address] [place], [place]  [address] Phone - [phone]   Fax - [phone]   [facility] [address] [place], [place]  [address] Phone - [phone]   Fax - [phone]  [facility] [address] [place], [place]  [address] Phone - [phone]   Fax - [phone]  [facility] [address] [place], [place]  [address] Phone - [phone]   Fax - [phone]  [facility] [address] [place], [place]  [address] Phone - [phone]   Fax - [phone]  [facility] [address] , [place]  [address] Phone - [phone]   Fax - [phone] [facility] [address] [place], [place]  [address] Phone - [phone]   Fax - [phone]  [facility] [address] [place], [place]  [address] Phone - [phone]   Fax - [phone]  [facility] [address] [place],   [address] Phone - [phone]   Fax - [phone]   

## 2016-03-03 LAB — AFP, SERUM, OPEN SPINA BIFIDA
AFP MOM: 0.42
AFP Value: 17.2 ng/mL
Gest. Age on Collection Date: 16.6 weeks
MATERNAL AGE AT EDD: 22.4 a
OSBR Risk 1 IN: 10000
TEST RESULTS AFP: NEGATIVE
Weight: 122 [lb_av]

## 2016-03-12 ENCOUNTER — Ambulatory Visit (HOSPITAL_COMMUNITY)
Admission: RE | Admit: 2016-03-12 | Discharge: 2016-03-12 | Disposition: A | Discharge status: Home / Self Care | Payer: Medicaid Other | Source: Ambulatory Visit | Attending: Family Medicine | Admitting: Family Medicine

## 2016-03-12 DIAGNOSIS — Z3689 Encounter for other specified antenatal screening: Secondary | ICD-10-CM | POA: Diagnosis not present

## 2016-03-12 DIAGNOSIS — Z3A19 19 weeks gestation of pregnancy: Secondary | ICD-10-CM | POA: Insufficient documentation

## 2016-03-12 NOTE — Addendum Note (Signed)
Encounter addended by: Drue NovelAlyssa J Kamy Poinsett, RDMS on: 03/12/2016  4:44 PM<BR>    Actions taken: Imaging Exam ended

## 2016-03-23 ENCOUNTER — Encounter: Payer: Medicaid Other | Admitting: Certified Nurse Midwife

## 2016-03-24 ENCOUNTER — Encounter: Payer: Medicaid Other | Admitting: Obstetrics and Gynecology

## 2016-03-31 ENCOUNTER — Ambulatory Visit (INDEPENDENT_AMBULATORY_CARE_PROVIDER_SITE_OTHER): Payer: Medicaid Other | Admitting: Obstetrics & Gynecology

## 2016-03-31 VITALS — BP 135/77 | HR 96 | Wt 130.0 lb

## 2016-03-31 DIAGNOSIS — Z3402 Encounter for supervision of normal first pregnancy, second trimester: Secondary | ICD-10-CM

## 2016-03-31 NOTE — Patient Instructions (Signed)

## 2016-03-31 NOTE — Progress Notes (Signed)
   PRENATAL VISIT NOTE  Subjective:  Beth Carr is a 21 y.o. G1P0000 at 2254w5d being seen today for ongoing prenatal care.  She is currently monitored for the following issues for this low-risk pregnancy and has Supervision of normal pregnancy and Genital herpes affecting pregnancy on her problem list.  Patient reports no complaints.  Contractions: Not present. Vag. Bleeding: None.  Movement: Present. Denies leaking of fluid.   The following portions of the patient's history were reviewed and updated as appropriate: allergies, current medications, past family history, past medical history, past social history, past surgical history and problem list. Problem list updated.  Objective:   Vitals:   03/31/16 1546  BP: 135/77  Pulse: 96  Weight: 130 lb (59 kg)    Fetal Status: Fetal Heart Rate (bpm): 158   Movement: Present     General:  Alert, oriented and cooperative. Patient is in no acute distress.  Skin: Skin is warm and dry. No rash noted.   Cardiovascular: Normal heart rate noted  Respiratory: Normal respiratory effort, no problems with respiration noted  Abdomen: Soft, gravid, appropriate for gestational age. Pain/Pressure: Present     Pelvic:  Cervical exam deferred        Extremities: Normal range of motion.  Edema: None  Mental Status: Normal mood and affect. Normal behavior. Normal judgment and thought content.   Assessment and Plan:  Pregnancy: G1P0000 at 2254w5d  1. Encounter for supervision of normal first pregnancy in second trimester  - US MFM OB FOLLOW UP; Future  Preterm labor symptoms and general obstetric precautions including but not limited to vaginal bleeding, contractions, leaking of fluid and fetal movement were reviewed in detail with the patient. Please refer to After Visit Summary for other counseling recommendations.  Return in about 4 weeks (around 04/28/2016) for 2 hr gtt.   Adam PhenixJames G Carmelita Amparo, MD

## 2016-04-28 ENCOUNTER — Ambulatory Visit (HOSPITAL_COMMUNITY)
Admission: RE | Admit: 2016-04-28 | Discharge: 2016-04-28 | Disposition: A | Discharge status: Home / Self Care | Payer: Medicaid Other | Source: Ambulatory Visit | Attending: Obstetrics & Gynecology | Admitting: Obstetrics & Gynecology

## 2016-04-28 ENCOUNTER — Encounter: Payer: Medicaid Other | Admitting: Family Medicine

## 2016-04-28 DIAGNOSIS — Z362 Encounter for other antenatal screening follow-up: Secondary | ICD-10-CM | POA: Diagnosis not present

## 2016-04-28 DIAGNOSIS — Z3A25 25 weeks gestation of pregnancy: Secondary | ICD-10-CM | POA: Diagnosis not present

## 2016-04-28 DIAGNOSIS — Z3402 Encounter for supervision of normal first pregnancy, second trimester: Secondary | ICD-10-CM

## 2016-05-03 ENCOUNTER — Encounter: Payer: Self-pay | Admitting: Family Medicine

## 2016-05-03 ENCOUNTER — Ambulatory Visit (INDEPENDENT_AMBULATORY_CARE_PROVIDER_SITE_OTHER): Payer: Medicaid Other | Admitting: Family Medicine

## 2016-05-03 VITALS — BP 123/68 | HR 59 | Wt 135.4 lb

## 2016-05-03 DIAGNOSIS — Z3402 Encounter for supervision of normal first pregnancy, second trimester: Secondary | ICD-10-CM

## 2016-05-03 DIAGNOSIS — A6009 Herpesviral infection of other urogenital tract: Secondary | ICD-10-CM

## 2016-05-03 DIAGNOSIS — O98312 Other infections with a predominantly sexual mode of transmission complicating pregnancy, second trimester: Secondary | ICD-10-CM

## 2016-05-03 NOTE — Progress Notes (Signed)
      Subjective:  Beth Carr is a 21 y.o. G1P0000 at [redacted]w[redacted]d being seen today for ongoing prenatal care.  She is currently monitored for the following issues for this low-risk pregnancy and has Supervision of normal pregnancy and Genital herpes affecting pregnancy on her problem list.  Patient reports no complaints.  Contractions: Not present. Vag. Bleeding: None.  Movement: Present. Denies leaking of fluid.   The following portions of the patient's history were reviewed and updated as appropriate: allergies, current medications, past family history, past medical history, past social history, past surgical history and problem list. Problem list updated.  Objective:   Vitals:   05/03/16 0932  BP: 123/68  Pulse: (!) 59  Weight: 135 lb 6.4 oz (61.4 kg)    Fetal Status: Fetal Heart Rate (bpm): 148 Fundal Height: 29 cm Movement: Present      General:  Alert, oriented and cooperative. Patient is in no acute distress.  Skin: Skin is warm and dry. No rash noted.   Cardiovascular: Normal heart rate noted  Respiratory: Normal respiratory effort, no problems with respiration noted  Abdomen: Soft, gravid, appropriate for gestational age. Pain/Pressure: Absent     Pelvic:  Cervical exam deferred        Extremities: Normal range of motion.  Edema: Trace  Mental Status: Normal mood and affect. Normal behavior. Normal judgment and thought content.   Urinalysis:      Assessment and Plan:  Pregnancy: G1P0000 at [redacted]w[redacted]d  1. Encounter for supervision of normal first pregnancy in second trimester - 2 hr GTT today, labwork today - Glucose Tolerance, 2 Hours w/1 Hour - CBC - RPR - HIV antibody  2. Genital herpes affecting pregnancy in second trimester - To start prophylaxis at 36 weeks - Last outbreak within last year but before pregnancy   MOC: Breastfeeding MOF: Nexplanon Preterm labor symptoms and general obstetric precautions including but not limited to vaginal bleeding,  contractions, leaking of fluid and fetal movement were reviewed in detail with the patient. Please refer to After Visit Summary for other counseling recommendations.  Return in about 4 weeks (around 05/31/2016) for Routine OB visit.   Jen Mow, DO OB Fellow Center for [facility], [facility]

## 2016-05-03 NOTE — Patient Instructions (Signed)
Second Trimester of Pregnancy The second trimester is from week 13 through week 28, month 4 through 6. This is often the time in pregnancy that you feel your best. Often times, morning sickness has lessened or quit. You may have more energy, and you may get hungry more often. Your unborn baby (fetus) is growing rapidly. At the end of the sixth month, he or she is about 9 inches long and weighs about 1 pounds. You will likely feel the baby move (quickening) between 18 and 20 weeks of pregnancy. Follow these instructions at home:  Avoid all smoking, herbs, and alcohol. Avoid drugs not approved by your doctor.  Do not use any tobacco products, including cigarettes, chewing tobacco, and electronic cigarettes. If you need help quitting, ask your doctor. You may get counseling or other support to help you quit.  Only take medicine as told by your doctor. Some medicines are safe and some are not during pregnancy.  Exercise only as told by your doctor. Stop exercising if you start having cramps.  Eat regular, healthy meals.  Wear a good support bra if your breasts are tender.  Do not use hot tubs, steam rooms, or saunas.  Wear your seat belt when driving.  Avoid raw meat, uncooked cheese, and liter boxes and soil used by cats.  Take your prenatal vitamins.  Take 1500-2000 milligrams of calcium daily starting at the 20th week of pregnancy until you deliver your baby.  Try taking medicine that helps you poop (stool softener) as needed, and if your doctor approves. Eat more fiber by eating fresh fruit, vegetables, and whole grains. Drink enough fluids to keep your pee (urine) clear or pale yellow.  Take warm water baths (sitz baths) to soothe pain or discomfort caused by hemorrhoids. Use hemorrhoid cream if your doctor approves.  If you have puffy, bulging veins (varicose veins), wear support hose. Raise (elevate) your feet for 15 minutes, 3-4 times a day. Limit salt in your diet.  Avoid heavy  lifting, wear low heals, and sit up straight.  Rest with your legs raised if you have leg cramps or low back pain.  Visit your dentist if you have not gone during your pregnancy. Use a soft toothbrush to brush your teeth. Be gentle when you floss.  You can have sex (intercourse) unless your doctor tells you not to.  Go to your doctor visits. Get help if:  You feel dizzy.  You have mild cramps or pressure in your lower belly (abdomen).  You have a nagging pain in your belly area.  You continue to feel sick to your stomach (nauseous), throw up (vomit), or have watery poop (diarrhea).  You have bad smelling fluid coming from your vagina.  You have pain with peeing (urination). Get help right away if:  You have a fever.  You are leaking fluid from your vagina.  You have spotting or bleeding from your vagina.  You have severe belly cramping or pain.  You lose or gain weight rapidly.  You have trouble catching your breath and have chest pain.  You notice sudden or extreme puffiness (swelling) of your face, hands, ankles, feet, or legs.  You have not felt the baby move in over an hour.  You have severe headaches that do not go away with medicine.  You have vision changes. This information is not intended to replace advice given to you by your health care provider. Make sure you discuss any questions you have with your health care   provider. Document Released: 03/17/2009 Document Revised: 05/29/2015 Document Reviewed: 02/22/2012 Elsevier Interactive Patient Education  2017 Elsevier Inc.   Pregnancy and Genital Herpes Genital herpes is an STD (sexually transmitted disease) that is caused by a virus. An active infection can cause itching, blisters, and sores (lesions) in the genital area or rectal area. This is called an outbreak. Symptoms of genital herpes may last several days and then go away. However, the virus remains in your body, so you may have more outbreaks of  symptoms in the future (recurrent infection). Genital herpes is particularly concerning for pregnant women because the virus can be passed to the unborn or newborn baby and cause serious problems. When can the herpes virus be passed to my baby? The virus can be passed to your baby:  Before delivery. The virus can be passed to your unborn baby through the placenta. This is more likely to occur if you get herpes for the first time (primary infection) in the first 3 months of your pregnancy (first trimester). This can possibly cause a miscarriage or birth defects in the baby.  During delivery. This is more likely to occur if you become infected for the first time late in your pregnancy. The virus is less likely to pass to your baby if you had herpes before you became pregnant. This is because antibodies against the virus develop over a period of time. These antibodies help to protect the baby.  After delivery. As a newborn, your baby can get a herpes infection if you touch active lesions and then touch your baby without washing your hands. Can I take medicines for herpes during pregnancy? Medicines may be prescribed that are safe for a mother and her unborn baby. The medicine can help to reduce symptoms or prevent another outbreak of the infection. If the infection happened before you became pregnant, medicine may be prescribed in the last 4 weeks of the pregnancy. This can help to prevent a breakout of the infection at the time of delivery. Will herpes affect my delivery? Your baby will likely need to be delivered by cesarean delivery ("C-section") if:  You have an active, recurrent, or new herpes outbreak at the time of delivery. This is because the virus can pass to the baby through an infected birth canal. This can cause severe problems for the baby.  You have any signs or symptoms of the infection being present in the genital area, even if you do not have any visible lesions in the birth canal.  Other symptoms may include genital pain, burning, and itching.  You develop the infection for the first time late in your pregnancy. Your health care provider may recommend a cesarean delivery because your body has not had the time to build up enough antibodies against the virus to protect the baby from getting the infection. You will likely be able to have a vaginal delivery if you had a first-time herpes infection before your third trimester and you have no evidence of an outbreak when you go into labor. Can I breastfeed my baby? A woman who is infected with genital herpes can breastfeed her baby. The virus will not be present in the breast milk. If lesions are present on a breast, the baby should not breastfeed from the affected breast. How can I avoid passing herpes to my baby after delivery? Take these actions that can help you to avoid passing the virus to your baby:  Wash your hands with soap and water often and before  touching your baby.  If you have an outbreak, keep the area clean and covered.  Try to avoid physical and stressful situations that may bring on an outbreak. When should I seek medical care? Seek medical care if you have signs or symptoms of a herpes outbreak at any time during your pregnancy. These signs or symptoms may include:  A rash, blisters, lesions, or ulcers in your genital area or rectal area.  Burning, itching, or pain in your genital area or rectal area.  Trouble urinating. This information is not intended to replace advice given to you by your health care provider. Make sure you discuss any questions you have with your health care provider. Document Released: 03/29/2000 Document Revised: 05/26/2015 Document Reviewed: 06/10/2014 Elsevier Interactive Patient Education  2017 [employer].

## 2016-05-04 LAB — CBC
HEMATOCRIT: 39.2 % (ref 34.0–46.6)
HEMOGLOBIN: 13 g/dL (ref 11.1–15.9)
MCH: 33.1 pg — ABNORMAL HIGH (ref 26.6–33.0)
MCHC: 33.2 g/dL (ref 31.5–35.7)
MCV: 100 fL — ABNORMAL HIGH (ref 79–97)
Platelets: 153 10*3/uL (ref 150–379)
RBC: 3.93 x10E6/uL (ref 3.77–5.28)
RDW: 13.6 % (ref 12.3–15.4)
WBC: 9.8 10*3/uL (ref 3.4–10.8)

## 2016-05-04 LAB — HIV ANTIBODY (ROUTINE TESTING W REFLEX): HIV SCREEN 4TH GENERATION: NONREACTIVE

## 2016-05-04 LAB — RPR: RPR Ser Ql: NONREACTIVE

## 2016-05-04 LAB — GLUCOSE TOLERANCE, 2 HOURS W/ 1HR
GLUCOSE, 2 HOUR: 56 mg/dL — AB (ref 65–152)
Glucose, 1 hour: 89 mg/dL (ref 65–179)
Glucose, Fasting: 72 mg/dL (ref 65–91)

## 2016-06-01 ENCOUNTER — Ambulatory Visit (INDEPENDENT_AMBULATORY_CARE_PROVIDER_SITE_OTHER): Payer: Medicaid Other | Admitting: Family Medicine

## 2016-06-01 VITALS — BP 114/77 | HR 64 | Wt 138.2 lb

## 2016-06-01 DIAGNOSIS — A6009 Herpesviral infection of other urogenital tract: Secondary | ICD-10-CM

## 2016-06-01 DIAGNOSIS — Z3403 Encounter for supervision of normal first pregnancy, third trimester: Secondary | ICD-10-CM

## 2016-06-01 DIAGNOSIS — O98313 Other infections with a predominantly sexual mode of transmission complicating pregnancy, third trimester: Secondary | ICD-10-CM

## 2016-06-01 DIAGNOSIS — O98319 Other infections with a predominantly sexual mode of transmission complicating pregnancy, unspecified trimester: Secondary | ICD-10-CM

## 2016-06-01 NOTE — Patient Instructions (Signed)
Pregnancy and Genital Herpes Genital herpes is an STD (sexually transmitted disease) that is caused by a virus. An active infection can cause itching, blisters, and sores (lesions) in the genital area or rectal area. This is called an outbreak. Symptoms of genital herpes may last several days and then go away. However, the virus remains in your body, so you may have more outbreaks of symptoms in the future (recurrent infection). Genital herpes is particularly concerning for pregnant women because the virus can be passed to the unborn or newborn baby and cause serious problems. When can the herpes virus be passed to my baby? The virus can be passed to your baby:  Before delivery. The virus can be passed to your unborn baby through the placenta. This is more likely to occur if you get herpes for the first time (primary infection) in the first 3 months of your pregnancy (first trimester). This can possibly cause a miscarriage or birth defects in the baby.  During delivery. This is more likely to occur if you become infected for the first time late in your pregnancy. The virus is less likely to pass to your baby if you had herpes before you became pregnant. This is because antibodies against the virus develop over a period of time. These antibodies help to protect the baby.  After delivery. As a newborn, your baby can get a herpes infection if you touch active lesions and then touch your baby without washing your hands. Can I take medicines for herpes during pregnancy? Medicines may be prescribed that are safe for a mother and her unborn baby. The medicine can help to reduce symptoms or prevent another outbreak of the infection. If the infection happened before you became pregnant, medicine may be prescribed in the last 4 weeks of the pregnancy. This can help to prevent a breakout of the infection at the time of delivery. Will herpes affect my delivery? Your baby will likely need to be delivered by  cesarean delivery ("C-section") if:  You have an active, recurrent, or new herpes outbreak at the time of delivery. This is because the virus can pass to the baby through an infected birth canal. This can cause severe problems for the baby.  You have any signs or symptoms of the infection being present in the genital area, even if you do not have any visible lesions in the birth canal. Other symptoms may include genital pain, burning, and itching.  You develop the infection for the first time late in your pregnancy. Your health care provider may recommend a cesarean delivery because your body has not had the time to build up enough antibodies against the virus to protect the baby from getting the infection. You will likely be able to have a vaginal delivery if you had a first-time herpes infection before your third trimester and you have no evidence of an outbreak when you go into labor. Can I breastfeed my baby? A woman who is infected with genital herpes can breastfeed her baby. The virus will not be present in the breast milk. If lesions are present on a breast, the baby should not breastfeed from the affected breast. How can I avoid passing herpes to my baby after delivery? Take these actions that can help you to avoid passing the virus to your baby:  Wash your hands with soap and water often and before touching your baby.  If you have an outbreak, keep the area clean and covered.  Try to avoid physical and  stressful situations that may bring on an outbreak. When should I seek medical care? Seek medical care if you have signs or symptoms of a herpes outbreak at any time during your pregnancy. These signs or symptoms may include:  A rash, blisters, lesions, or ulcers in your genital area or rectal area.  Burning, itching, or pain in your genital area or rectal area.  Trouble urinating. This information is not intended to replace advice given to you by your health care provider. Make sure  you discuss any questions you have with your health care provider. Document Released: 03/29/2000 Document Revised: 05/26/2015 Document Reviewed: 06/10/2014 Elsevier Interactive Patient Education  2017 [employer]

## 2016-06-01 NOTE — Progress Notes (Signed)
      Subjective:  Beth Carr is a 21 y.o. G1P0000 at 1254w4d being seen today for ongoing prenatal care.  She is currently monitored for the following issues for this low-risk pregnancy and has Supervision of normal pregnancy and Genital herpes affecting pregnancy on her problem list.  Patient reports no complaints. Recent outbreak of HSV about 1 week ago, went away with medication.  Contractions: Not present.  .  Movement: Present. Denies leaking of fluid.   The following portions of the patient's history were reviewed and updated as appropriate: allergies, current medications, past family history, past medical history, past social history, past surgical history and problem list. Problem list updated.  Objective:   Vitals:   06/01/16 0807  BP: 114/77  Pulse: 64  Weight: 138 lb 3.2 oz (62.7 kg)    Fetal Status: Fetal Heart Rate (bpm): 141 Fundal Height: 30 cm Movement: Present      General:  Alert, oriented and cooperative. Patient is in no acute distress.  Skin: Skin is warm and dry. No rash noted.   Cardiovascular: Normal heart rate noted  Respiratory: Normal respiratory effort, no problems with respiration noted  Abdomen: Soft, gravid, appropriate for gestational age. Pain/Pressure: Absent     Pelvic:  Cervical exam deferred        Extremities: Normal range of motion.     Mental Status: Normal mood and affect. Normal behavior. Normal judgment and thought content.   Urinalysis:      Assessment and Plan:  Pregnancy: G1P0000 at 5954w4d  1. Encounter for supervision of normal first pregnancy in third trimester - UTD on labwork, GTT normal  2. Genital herpes affecting pregnancy, antepartum - Had a recent outbreak last week, took her medication, went away, no current lesions. Discussed importance of prophylaxis treatment closer to term.  - Understands if outbreak is present on day of labor/induction that it will require Csection.   MOC: Nexplanon MOF: Breast Preterm labor  symptoms and general obstetric precautions including but not limited to vaginal bleeding, contractions, leaking of fluid and fetal movement were reviewed in detail with the patient. Please refer to After Visit Summary for other counseling recommendations.  Return in about 2 weeks (around 06/15/2016) for Routine OB visit.   Jen MowElizabeth Kahealani Yankovich, DO OB Fellow [facility]

## 2016-06-07 ENCOUNTER — Encounter (HOSPITAL_COMMUNITY): Payer: Self-pay | Admitting: *Deleted

## 2016-06-07 ENCOUNTER — Inpatient Hospital Stay (HOSPITAL_COMMUNITY)
Admission: AD | Admit: 2016-06-07 | Discharge: 2016-06-07 | Disposition: A | Discharge status: Home / Self Care | Payer: Medicaid Other | Source: Ambulatory Visit | Attending: Obstetrics and Gynecology | Admitting: Obstetrics and Gynecology

## 2016-06-07 DIAGNOSIS — R109 Unspecified abdominal pain: Secondary | ICD-10-CM | POA: Diagnosis not present

## 2016-06-07 DIAGNOSIS — J45909 Unspecified asthma, uncomplicated: Secondary | ICD-10-CM | POA: Diagnosis not present

## 2016-06-07 DIAGNOSIS — O26893 Other specified pregnancy related conditions, third trimester: Secondary | ICD-10-CM | POA: Diagnosis not present

## 2016-06-07 DIAGNOSIS — Z3A31 31 weeks gestation of pregnancy: Secondary | ICD-10-CM | POA: Diagnosis not present

## 2016-06-07 DIAGNOSIS — R1030 Lower abdominal pain, unspecified: Secondary | ICD-10-CM | POA: Insufficient documentation

## 2016-06-07 DIAGNOSIS — O99513 Diseases of the respiratory system complicating pregnancy, third trimester: Secondary | ICD-10-CM | POA: Insufficient documentation

## 2016-06-07 LAB — URINALYSIS, ROUTINE W REFLEX MICROSCOPIC
BILIRUBIN URINE: NEGATIVE
Glucose, UA: NEGATIVE mg/dL
Hgb urine dipstick: NEGATIVE
KETONES UR: NEGATIVE mg/dL
Nitrite: NEGATIVE
PH: 6 (ref 5.0–8.0)
PROTEIN: NEGATIVE mg/dL
Specific Gravity, Urine: 1.018 (ref 1.005–1.030)

## 2016-06-07 LAB — WET PREP, GENITAL
Clue Cells Wet Prep HPF POC: NONE SEEN
SPERM: NONE SEEN
Trich, Wet Prep: NONE SEEN
YEAST WET PREP: NONE SEEN

## 2016-06-07 LAB — OB RESULTS CONSOLE GC/CHLAMYDIA: Gonorrhea: NEGATIVE

## 2016-06-07 LAB — FETAL FIBRONECTIN: Fetal Fibronectin: NEGATIVE
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
## 2016-06-07 NOTE — Discharge Instructions (Signed)

## 2016-06-08 LAB — CULTURE, OB URINE: Culture: NO GROWTH

## 2016-06-08 LAB — GC/CHLAMYDIA PROBE AMP (~~LOC~~) NOT AT ARMC
Chlamydia: NEGATIVE
Neisseria Gonorrhea: NEGATIVE

## 2016-06-14 ENCOUNTER — Ambulatory Visit (INDEPENDENT_AMBULATORY_CARE_PROVIDER_SITE_OTHER): Payer: Medicaid Other | Admitting: Obstetrics & Gynecology

## 2016-06-14 VITALS — BP 128/62 | HR 71 | Wt 137.5 lb

## 2016-06-14 DIAGNOSIS — Z3403 Encounter for supervision of normal first pregnancy, third trimester: Secondary | ICD-10-CM

## 2016-06-14 NOTE — Progress Notes (Signed)
Breastfeeding tip reviewed 

## 2016-06-14 NOTE — Progress Notes (Signed)
   PRENATAL VISIT NOTE  Subjective:  Beth Carr is a 21 y.o. G1P0000 at 4852w3d being seen today for ongoing prenatal care.  She is currently monitored for the following issues for this low-risk pregnancy and has Supervision of normal pregnancy and Genital herpes affecting pregnancy on her problem list.  Patient reports no complaints.  Contractions: Not present. Vag. Bleeding: None.  Movement: Present. Denies leaking of fluid.   The following portions of the patient's history were reviewed and updated as appropriate: allergies, current medications, past family history, past medical history, past social history, past surgical history and problem list. Problem list updated.  Objective:   Vitals:   06/14/16 1313  BP: 128/62  Pulse: 71  Weight: 137 lb 8 oz (62.4 kg)    Fetal Status: Fetal Heart Rate (bpm): 160 Fundal Height: 33 cm Movement: Present     General:  Alert, oriented and cooperative. Patient is in no acute distress.  Skin: Skin is warm and dry. No rash noted.   Cardiovascular: Normal heart rate noted  Respiratory: Normal respiratory effort, no problems with respiration noted  Abdomen: Soft, gravid, appropriate for gestational age. Pain/Pressure: Present     Pelvic:  Cervical exam deferred        Extremities: Normal range of motion.  Edema: Trace  Mental Status: Normal mood and affect. Normal behavior. Normal judgment and thought content.   Assessment and Plan:  Pregnancy: G1P0000 at 4852w3d  There are no diagnoses linked to this encounter. Preterm labor symptoms and general obstetric precautions including but not limited to vaginal bleeding, contractions, leaking of fluid and fetal movement were reviewed in detail with the patient. Please refer to After Visit Summary for other counseling recommendations.  Return in about 2 weeks (around 06/28/2016).   Scheryl DarterJames Boen Sterbenz, MD

## 2016-06-14 NOTE — Patient Instructions (Signed)
Third Trimester of Pregnancy The third trimester is from week 28 through week 40 (months 7 through 9). The third trimester is a time when the unborn baby (fetus) is growing rapidly. At the end of the ninth month, the fetus is about 20 inches in length and weighs 6-10 pounds. Body changes during your third trimester Your body will continue to go through many changes during pregnancy. The changes vary from woman to woman. During the third trimester:  Your weight will continue to increase. You can expect to gain 25-35 pounds (11-16 kg) by the end of the pregnancy.  You may begin to get stretch marks on your hips, abdomen, and breasts.  You may urinate more often because the fetus is moving lower into your pelvis and pressing on your bladder.  You may develop or continue to have heartburn. This is caused by increased hormones that slow down muscles in the digestive tract.  You may develop or continue to have constipation because increased hormones slow digestion and cause the muscles that push waste through your intestines to relax.  You may develop hemorrhoids. These are swollen veins (varicose veins) in the rectum that can itch or be painful.  You may develop swollen, bulging veins (varicose veins) in your legs.  You may have increased body aches in the pelvis, back, or thighs. This is due to weight gain and increased hormones that are relaxing your joints.  You may have changes in your hair. These can include thickening of your hair, rapid growth, and changes in texture. Some women also have hair loss during or after pregnancy, or hair that feels dry or thin. Your hair will most likely return to normal after your baby is born.  Your breasts will continue to grow and they will continue to become tender. A yellow fluid (colostrum) may leak from your breasts. This is the first milk you are producing for your baby.  Your belly button may stick out.  You may notice more swelling in your hands,  face, or ankles.  You may have increased tingling or numbness in your hands, arms, and legs. The skin on your belly may also feel numb.  You may feel short of breath because of your expanding uterus.  You may have more problems sleeping. This can be caused by the size of your belly, increased need to urinate, and an increase in your body's metabolism.  You may notice the fetus "dropping," or moving lower in your abdomen (lightening).  You may have increased vaginal discharge.  You may notice your joints feel loose and you may have pain around your pelvic bone.  What to expect at prenatal visits You will have prenatal exams every 2 weeks until week 36. Then you will have weekly prenatal exams. During a routine prenatal visit:  You will be weighed to make sure you and the baby are growing normally.  Your blood pressure will be taken.  Your abdomen will be measured to track your baby's growth.  The fetal heartbeat will be listened to.  Any test results from the previous visit will be discussed.  You may have a cervical check near your due date to see if your cervix has softened or thinned (effaced).  You will be tested for Group B streptococcus. This happens between 35 and 37 weeks.  Your health care provider may ask you:  What your birth plan is.  How you are feeling.  If you are feeling the baby move.  If you have had   any abnormal symptoms, such as leaking fluid, bleeding, severe headaches, or abdominal cramping.  If you are using any tobacco products, including cigarettes, chewing tobacco, and electronic cigarettes.  If you have any questions.  Other tests or screenings that may be performed during your third trimester include:  Blood tests that check for low iron levels (anemia).  Fetal testing to check the health, activity level, and growth of the fetus. Testing is done if you have certain medical conditions or if there are problems during the  pregnancy.  Nonstress test (NST). This test checks the health of your baby to make sure there are no signs of problems, such as the baby not getting enough oxygen. During this test, a belt is placed around your belly. The baby is made to move, and its heart rate is monitored during movement.  What is false labor? False labor is a condition in which you feel small, irregular tightenings of the muscles in the womb (contractions) that usually go away with rest, changing position, or drinking water. These are called Braxton Hicks contractions. Contractions may last for hours, days, or even weeks before true labor sets in. If contractions come at regular intervals, become more frequent, increase in intensity, or become painful, you should see your health care provider. What are the signs of labor?  Abdominal cramps.  Regular contractions that start at 10 minutes apart and become stronger and more frequent with time.  Contractions that start on the top of the uterus and spread down to the lower abdomen and back.  Increased pelvic pressure and dull back pain.  A watery or bloody mucus discharge that comes from the vagina.  Leaking of amniotic fluid. This is also known as your "water breaking." It could be a slow trickle or a gush. Let your health care provider know if it has a color or strange odor. If you have any of these signs, call your health care provider right away, even if it is before your due date. Follow these instructions at home: Medicines  Follow your health care provider's instructions regarding medicine use. Specific medicines may be either safe or unsafe to take during pregnancy.  Take a prenatal vitamin that contains at least 600 micrograms (mcg) of folic acid.  If you develop constipation, try taking a stool softener if your health care provider approves. Eating and drinking  Eat a balanced diet that includes fresh fruits and vegetables, whole grains, good sources of protein  such as meat, eggs, or tofu, and low-fat dairy. Your health care provider will help you determine the amount of weight gain that is right for you.  Avoid raw meat and uncooked cheese. These carry germs that can cause birth defects in the baby.  If you have low calcium intake from food, talk to your health care provider about whether you should take a daily calcium supplement.  Eat four or five small meals rather than three large meals a day.  Limit foods that are high in fat and processed sugars, such as fried and sweet foods.  To prevent constipation: ? Drink enough fluid to keep your urine clear or pale yellow. ? Eat foods that are high in fiber, such as fresh fruits and vegetables, whole grains, and beans. Activity  Exercise only as directed by your health care provider. Most women can continue their usual exercise routine during pregnancy. Try to exercise for 30 minutes at least 5 days a week. Stop exercising if you experience uterine contractions.  Avoid heavy   lifting.  Do not exercise in extreme heat or humidity, or at high altitudes.  Wear low-heel, comfortable shoes.  Practice good posture.  You may continue to have sex unless your health care provider tells you otherwise. Relieving pain and discomfort  Take frequent breaks and rest with your legs elevated if you have leg cramps or low back pain.  Take warm sitz baths to soothe any pain or discomfort caused by hemorrhoids. Use hemorrhoid cream if your health care provider approves.  Wear a good support bra to prevent discomfort from breast tenderness.  If you develop varicose veins: ? Wear support pantyhose or compression stockings as told by your healthcare provider. ? Elevate your feet for 15 minutes, 3-4 times a day. Prenatal care  Write down your questions. Take them to your prenatal visits.  Keep all your prenatal visits as told by your health care provider. This is important. Safety  Wear your seat belt at  all times when driving.  Make a list of emergency phone numbers, including numbers for family, friends, the hospital, and police and fire departments. General instructions  Avoid cat litter boxes and soil used by cats. These carry germs that can cause birth defects in the baby. If you have a cat, ask someone to clean the litter box for you.  Do not travel far distances unless it is absolutely necessary and only with the approval of your health care provider.  Do not use hot tubs, steam rooms, or saunas.  Do not drink alcohol.  Do not use any products that contain nicotine or tobacco, such as cigarettes and e-cigarettes. If you need help quitting, ask your health care provider.  Do not use any medicinal herbs or unprescribed drugs. These chemicals affect the formation and growth of the baby.  Do not douche or use tampons or scented sanitary pads.  Do not cross your legs for long periods of time.  To prepare for the arrival of your baby: ? Take prenatal classes to understand, practice, and ask questions about labor and delivery. ? Make a trial run to the hospital. ? Visit the hospital and tour the maternity area. ? Arrange for maternity or paternity leave through employers. ? Arrange for family and friends to take care of pets while you are in the hospital. ? Purchase a rear-facing car seat and make sure you know how to install it in your car. ? Pack your hospital bag. ? Prepare the baby's nursery. Make sure to remove all pillows and stuffed animals from the baby's crib to prevent suffocation.  Visit your dentist if you have not gone during your pregnancy. Use a soft toothbrush to brush your teeth and be gentle when you floss. Contact a health care provider if:  You are unsure if you are in labor or if your water has broken.  You become dizzy.  You have mild pelvic cramps, pelvic pressure, or nagging pain in your abdominal area.  You have lower back pain.  You have persistent  nausea, vomiting, or diarrhea.  You have an unusual or bad smelling vaginal discharge.  You have pain when you urinate. Get help right away if:  Your water breaks before 37 weeks.  You have regular contractions less than 5 minutes apart before 37 weeks.  You have a fever.  You are leaking fluid from your vagina.  You have spotting or bleeding from your vagina.  You have severe abdominal pain or cramping.  You have rapid weight loss or weight gain.    You have shortness of breath with chest pain.  You notice sudden or extreme swelling of your face, hands, ankles, feet, or legs.  Your baby makes fewer than 10 movements in 2 hours.  You have severe headaches that do not go away when you take medicine.  You have vision changes. Summary  The third trimester is from week 28 through week 40, months 7 through 9. The third trimester is a time when the unborn baby (fetus) is growing rapidly.  During the third trimester, your discomfort may increase as you and your baby continue to gain weight. You may have abdominal, leg, and back pain, sleeping problems, and an increased need to urinate.  During the third trimester your breasts will keep growing and they will continue to become tender. A yellow fluid (colostrum) may leak from your breasts. This is the first milk you are producing for your baby.  False labor is a condition in which you feel small, irregular tightenings of the muscles in the womb (contractions) that eventually go away. These are called Braxton Hicks contractions. Contractions may last for hours, days, or even weeks before true labor sets in.  Signs of labor can include: abdominal cramps; regular contractions that start at 10 minutes apart and become stronger and more frequent with time; watery or bloody mucus discharge that comes from the vagina; increased pelvic pressure and dull back pain; and leaking of amniotic fluid. This information is not intended to replace advice  given to you by your health care provider. Make sure you discuss any questions you have with your health care provider. Document Released: 12/15/2000 Document Revised: 05/29/2015 Document Reviewed: 02/22/2012 Elsevier Interactive Patient Education  2017 [employer]  

## 2016-06-28 ENCOUNTER — Encounter: Payer: Self-pay | Admitting: Certified Nurse Midwife

## 2016-06-28 ENCOUNTER — Ambulatory Visit (INDEPENDENT_AMBULATORY_CARE_PROVIDER_SITE_OTHER): Payer: Medicaid Other | Admitting: Certified Nurse Midwife

## 2016-06-28 DIAGNOSIS — Z3403 Encounter for supervision of normal first pregnancy, third trimester: Secondary | ICD-10-CM

## 2016-06-28 DIAGNOSIS — Z23 Encounter for immunization: Secondary | ICD-10-CM

## 2016-06-28 NOTE — Patient Instructions (Signed)

## 2016-06-28 NOTE — Addendum Note (Signed)
Addended by: Clearnce SorrelPICKARD, Duana Benedict S on: 06/28/2016 08:54 AM   Modules accepted: Orders

## 2016-06-28 NOTE — Progress Notes (Signed)
Subjective:  Beth Carr is a 21 y.o. G1P0000 at 4116w3d being seen today for ongoing prenatal care.  She is currently monitored for the following issues for this low-risk pregnancy and has Supervision of normal pregnancy and Genital herpes affecting pregnancy on her problem list.  Patient reports no complaints.  Contractions: Irregular. Vag. Bleeding: None.  Movement: Present. Denies leaking of fluid.   The following portions of the patient's history were reviewed and updated as appropriate: allergies, current medications, past family history, past medical history, past social history, past surgical history and problem list. Problem list updated.  Objective:   Vitals:   06/28/16 0800  BP: 116/66  Pulse: 62  Weight: 139 lb 4.8 oz (63.2 kg)    Fetal Status: Fetal Heart Rate (bpm): 145 Fundal Height: 34 cm Movement: Present  Presentation: Vertex  General:  Alert, oriented and cooperative. Patient is in no acute distress.  Skin: Skin is warm and dry. No rash noted.   Cardiovascular: Normal heart rate noted  Respiratory: Normal respiratory effort, no problems with respiration noted  Abdomen: Soft, gravid, appropriate for gestational age. Pain/Pressure: Present     Pelvic: Vag. Bleeding: None     Cervical exam deferred        Extremities: Normal range of motion.  Edema: Trace  Mental Status: Normal mood and affect. Normal behavior. Normal judgment and thought content.   Urinalysis:      Assessment and Plan:  Pregnancy: G1P0000 at 6616w3d  1. Encounter for supervision of normal first pregnancy in third trimester - tdap today - 3rd trimester anticipatory guidance  Preterm labor symptoms and general obstetric precautions including but not limited to vaginal bleeding, contractions, leaking of fluid and fetal movement were reviewed in detail with the patient. Please refer to After Visit Summary for other counseling recommendations.  Return in about 2 weeks (around  07/12/2016).   Donette LarryBhambri, Rawley Harju, CNM

## 2016-07-12 ENCOUNTER — Ambulatory Visit (INDEPENDENT_AMBULATORY_CARE_PROVIDER_SITE_OTHER): Payer: Medicaid Other | Admitting: Family Medicine

## 2016-07-12 VITALS — BP 119/77 | HR 69 | Wt 144.9 lb

## 2016-07-12 DIAGNOSIS — A6009 Herpesviral infection of other urogenital tract: Secondary | ICD-10-CM

## 2016-07-12 DIAGNOSIS — Z3403 Encounter for supervision of normal first pregnancy, third trimester: Secondary | ICD-10-CM

## 2016-07-12 DIAGNOSIS — O98319 Other infections with a predominantly sexual mode of transmission complicating pregnancy, unspecified trimester: Secondary | ICD-10-CM

## 2016-07-12 LAB — OB RESULTS CONSOLE GBS: GBS: NEGATIVE

## 2016-07-12 MED ORDER — VALACYCLOVIR HCL 500 MG PO TABS: 500.0000 mg | ORAL_TABLET | Freq: Two times a day (BID) | ORAL | 1 refills | Status: DC

## 2016-07-12 NOTE — Progress Notes (Signed)
      Subjective:  Beth Carr is a 21 y.o. G1P0000 at 637w3d being seen today for ongoing prenatal care.  She is currently monitored for the following issues for this low-risk pregnancy and has Supervision of normal pregnancy and Genital herpes affecting pregnancy on her problem list.  Patient reports no complaints.  Contractions: Not present. Vag. Bleeding: None.  Movement: Present. Denies leaking of fluid.   The following portions of the patient's history were reviewed and updated as appropriate: allergies, current medications, past family history, past medical history, past social history, past surgical history and problem list. Problem list updated.  Objective:   Vitals:   07/12/16 0809  BP: 119/77  Pulse: 69  Weight: 144 lb 14.4 oz (65.7 kg)    Fetal Status: Fetal Heart Rate (bpm): 145 Fundal Height: 36 cm Movement: Present  Presentation: Undeterminable   General:  Alert, oriented and cooperative. Patient is in no acute distress.  Skin: Skin is warm and dry. No rash noted.   Cardiovascular: Normal heart rate noted  Respiratory: Normal respiratory effort, no problems with respiration noted  Abdomen: Soft, gravid, appropriate for gestational age. Pain/Pressure: Present     Pelvic:  Cervical exam performed Dilation: Closed Effacement (%): Thick and long; Station: -3  Extremities: Normal range of motion.  Edema: Trace  Mental Status: Normal mood and affect. Normal behavior. Normal judgment and thought content.   Urinalysis:      Assessment and Plan:  Pregnancy: G1P0000 at 617w3d  1. Encounter for supervision of normal first pregnancy in third trimester - Cervix is closed/thick/long/-3, difficult to determine presentation due to unfavorable cervix, but Leopold's feels like vertex - GBS collected today - GC/CT done 06/07/16 and was negative  2. Genital herpes affecting pregnancy, antepartum - Discussed medication, to pick up from pharmacy today. Last outbreak 1 month ago. No  symptoms.  - valACYclovir (VALTREX) 500 MG tablet; Take 1 tablet (500 mg total) by mouth 2 (two) times daily. Until delivery of baby  Dispense: 60 tablet; Refill: 1   MOC: Nexplanon MOF: Brestfeeding Preterm labor symptoms and general obstetric precautions including but not limited to vaginal bleeding, contractions, leaking of fluid and fetal movement were reviewed in detail with the patient. Please refer to After Visit Summary for other counseling recommendations.  Return in about 1 week (around 07/19/2016) for Routine OB visit.   Jen MowElizabeth Mumaw, DO [facility]

## 2016-07-12 NOTE — Addendum Note (Signed)
Addended by: Clearnce SorrelPICKARD, Amara Manalang S on: 07/12/2016 09:07 AM   Modules accepted: Orders

## 2016-07-12 NOTE — Patient Instructions (Signed)
Group B Streptococcus Infection During Pregnancy Group B Streptococcus (GBS) is a type of bacteria (Streptococcus agalactiae) that is often found in healthy people, commonly in the rectum, vagina, and intestines. In people who are healthy and not pregnant, the bacteria rarely cause serious illness or complications. However, women who test positive for GBS during pregnancy can pass the bacteria to their baby during childbirth, which can cause serious infection in the baby after birth. Women with GBS may also have infections during their pregnancy or immediately after childbirth, such as such as urinary tract infections (UTIs) or infections of the uterus (uterine infections). Having GBS also increases a woman's risk of complications during pregnancy, such as early (preterm) labor or delivery, miscarriage, or stillbirth. Routine testing (screening) for GBS is recommended for all pregnant women. What increases the risk? You may have a higher risk for GBS infection during pregnancy if you had one during a past pregnancy. What are the signs or symptoms? In most cases, GBS infection does not cause symptoms in pregnant women. Signs and symptoms of a possible GBS-related infection may include:  Labor starting before the 37th week of pregnancy.  A UTI or bladder infection, which may cause: ? Fever. ? Pain or burning during urination. ? Frequent urination.  Fever during labor, along with: ? Bad-smelling discharge. ? Uterine tenderness. ? Rapid heartbeat in the mother, baby, or both.  Rare but serious symptoms of a possible GBS-related infection in women include:  Blood infection (septicemia). This may cause fever, chills, or confusion.  Lung infection (pneumonia). This may cause fever, chills, cough, rapid breathing, difficulty breathing, or chest pain.  Bone, joint, skin, or soft tissue infection.  How is this diagnosed? You may be screened for GBS between week 35 and week 37 of your pregnancy. If  you have symptoms of preterm labor, you may be screened earlier. This condition is diagnosed based on lab test results from:  A swab of fluid from the vagina and rectum.  A urine sample.  How is this treated? This condition is treated with antibiotic medicine. When you go into labor, or as soon as your water breaks (your membranes rupture), you will be given antibiotics through an IV tube. Antibiotics will continue until after you give birth. If you are having a cesarean delivery, you do not need antibiotics unless your membranes have already ruptured. Follow these instructions at home:  Take over-the-counter and prescription medicines only as told by your health care provider.  Take your antibiotic medicine as told by your health care provider. Do not stop taking the antibiotic even if you start to feel better.  Keep all pre-birth (prenatal) visits and follow-up visits as told by your health care provider. This is important. Contact a health care provider if:  You have pain or burning when you urinate.  You have to urinate frequently.  You have a fever or chills.  You develop a bad-smelling vaginal discharge. Get help right away if:  Your membranes rupture.  You go into labor.  You have severe pain in your abdomen.  You have difficulty breathing.  You have chest pain. This information is not intended to replace advice given to you by your health care provider. Make sure you discuss any questions you have with your health care provider. Document Released: 03/30/2007 Document Revised: 07/18/2015 Document Reviewed: 07/17/2015 Elsevier Interactive Patient Education  2018 [employer].  

## 2016-07-16 LAB — CULTURE, BETA STREP (GROUP B ONLY): Strep Gp B Culture: NEGATIVE

## 2016-07-21 ENCOUNTER — Ambulatory Visit (INDEPENDENT_AMBULATORY_CARE_PROVIDER_SITE_OTHER): Payer: Medicaid Other | Admitting: Certified Nurse Midwife

## 2016-07-21 VITALS — BP 127/75 | HR 61 | Wt 144.9 lb

## 2016-07-21 DIAGNOSIS — Z3403 Encounter for supervision of normal first pregnancy, third trimester: Secondary | ICD-10-CM

## 2016-07-21 DIAGNOSIS — O98313 Other infections with a predominantly sexual mode of transmission complicating pregnancy, third trimester: Secondary | ICD-10-CM

## 2016-07-21 DIAGNOSIS — A6009 Herpesviral infection of other urogenital tract: Secondary | ICD-10-CM

## 2016-07-21 NOTE — Patient Instructions (Signed)

## 2016-07-21 NOTE — Progress Notes (Signed)
   PRENATAL VISIT NOTE  Subjective:  Beth Carr is a 21 y.o. G1P0000 at 6671w5d being seen today for ongoing prenatal care.  She is currently monitored for the following issues for this low-risk pregnancy and has Supervision of normal pregnancy and Genital herpes affecting pregnancy on her problem list.  Patient reports no complaints.  Contractions: Not present. Vag. Bleeding: None.  Movement: Present. Denies leaking of fluid.   The following portions of the patient's history were reviewed and updated as appropriate: allergies, current medications, past family history, past medical history, past social history, past surgical history and problem list. Problem list updated.  Objective:   Vitals:   07/21/16 0941  BP: 127/75  Pulse: 61  Weight: 144 lb 14.4 oz (65.7 kg)    Fetal Status: Fetal Heart Rate (bpm): 143 Fundal Height: 37 cm Movement: Present  Presentation: Vertex  General:  Alert, oriented and cooperative. Patient is in no acute distress.  Skin: Skin is warm and dry. No rash noted.   Cardiovascular: Normal heart rate noted  Respiratory: Normal respiratory effort, no problems with respiration noted  Abdomen: Soft, gravid, appropriate for gestational age.  Pain/Pressure: Present     Pelvic: Cervical exam deferred        Extremities: Normal range of motion.  Edema: Trace  Mental Status:  Normal mood and affect. Normal behavior. Normal judgment and thought content.   Assessment and Plan:  Pregnancy: G1P0000 at 2871w5d  1. Encounter for supervision of normal first pregnancy in third trimester - Discussed labor precautions  - Routine PNC. Follow up in 1 week  2. Genital herpes affecting pregnancy in third trimester - Continue Valtrex  Term labor symptoms and general obstetric precautions including but not limited to vaginal bleeding, contractions, leaking of fluid and fetal movement were reviewed in detail with the patient. Please refer to After Visit Summary for other  counseling recommendations.  Return in about 1 week (around 07/28/2016).   Frederik PearJulie P Degele, MD

## 2016-07-22 ENCOUNTER — Encounter (HOSPITAL_COMMUNITY): Payer: Self-pay

## 2016-07-22 ENCOUNTER — Inpatient Hospital Stay (HOSPITAL_COMMUNITY)
Admission: AD | Admit: 2016-07-22 | Discharge: 2016-07-22 | Disposition: A | Discharge status: Home / Self Care | Payer: Medicaid Other | Source: Ambulatory Visit | Attending: Obstetrics and Gynecology | Admitting: Obstetrics and Gynecology

## 2016-07-22 DIAGNOSIS — Z3A37 37 weeks gestation of pregnancy: Secondary | ICD-10-CM | POA: Diagnosis not present

## 2016-07-22 DIAGNOSIS — O471 False labor at or after 37 completed weeks of gestation: Secondary | ICD-10-CM | POA: Diagnosis present

## 2016-07-22 DIAGNOSIS — O479 False labor, unspecified: Secondary | ICD-10-CM

## 2016-07-22 LAB — POCT FERN TEST: POCT Fern Test: NEGATIVE

## 2016-07-22 NOTE — MAU Note (Signed)
I have communicated with Resident- Christian and reviewed vital signs:  Vitals:   07/22/16 1426 07/22/16 1427  BP:  120/74  Pulse:  77  Resp: 16   Temp:      Vaginal exam:  Dilation: Closed Effacement (%): Thick Station: -3 Presentation: Vertex Exam by:: Ginnie Smartachel Myosha Cuadras RN,   Also reviewed contraction pattern and that non-stress test is reactive.  It has been documented that patient is contracting every 4-7 minutes with no cervical change since she was checked last week not indicating active labor.  Patient denies any other complaints.  Based on this report provider has given order for discharge.  A discharge order and diagnosis entered by a provider.   Labor discharge instructions reviewed with patient.

## 2016-07-22 NOTE — Discharge Instructions (Signed)

## 2016-07-22 NOTE — MAU Note (Signed)
Pt reports contractions started about an hour ago about 3-4 min apart. Reports a little bit of leaking on the way here. Good fetal movement felt. Was closed on exam from last week

## 2016-07-29 ENCOUNTER — Ambulatory Visit (INDEPENDENT_AMBULATORY_CARE_PROVIDER_SITE_OTHER): Payer: Medicaid Other | Admitting: Obstetrics and Gynecology

## 2016-07-29 VITALS — BP 129/70 | HR 63 | Wt 149.3 lb

## 2016-07-29 DIAGNOSIS — Z3403 Encounter for supervision of normal first pregnancy, third trimester: Secondary | ICD-10-CM

## 2016-07-29 MED ORDER — CYCLOBENZAPRINE HCL 10 MG PO TABS: 10.0000 mg | ORAL_TABLET | Freq: Three times a day (TID) | ORAL | 0 refills | Status: DC | PRN

## 2016-07-29 NOTE — Progress Notes (Signed)
   PRENATAL VISIT NOTE  Subjective:  Beth Carr is a 21 y.o. G1P0000 at 6449w6d being seen today for ongoing prenatal care.  She is currently monitored for the following issues for this low-risk pregnancy and has Supervision of normal pregnancy and Genital herpes affecting pregnancy on her problem list.  Patient reports Pressure, irregular contraction pain. .  Contractions: Irregular. Vag. Bleeding: None.  Movement: Present. Denies leaking of fluid.   The following portions of the patient's history were reviewed and updated as appropriate: allergies, current medications, past family history, past medical history, past social history, past surgical history and problem list. Problem list updated.  Objective:   Vitals:   07/29/16 1033  BP: 129/70  Pulse: 63  Weight: 149 lb 4.8 oz (67.7 kg)    Fetal Status: Fetal Heart Rate (bpm): 156 Fundal Height: 38 cm Movement: Present  Presentation: Undeterminable  General:  Alert, oriented and cooperative. Patient is in no acute distress.  Skin: Skin is warm and dry. No rash noted.   Cardiovascular: Normal heart rate noted  Respiratory: Normal respiratory effort, no problems with respiration noted  Abdomen: Soft, gravid, appropriate for gestational age.  Pain/Pressure: Present     Pelvic: Cervical exam performed Dilation: Closed Effacement (%): Thick Station: -3  Extremities: Normal range of motion.  Edema: Trace  Mental Status:  Normal mood and affect. Normal behavior. Normal judgment and thought content.   Assessment and Plan:  Pregnancy: G1P0000 at 2049w6d  1. Encounter for supervision of normal first pregnancy in third trimester  Uncomfortable mainly at night with pressure and braxton hicks contractions. Pelvic pain at times. Rx: Flexeril. Talked about different position changes.    Term labor symptoms and general obstetric precautions including but not limited to vaginal bleeding, contractions, leaking of fluid and fetal movement were  reviewed in detail with the patient. Please refer to After Visit Summary for other counseling recommendations.  Return in about 1 week (around 08/05/2016).   Lillan Mccreadie, Harolyn RutherfordJennifer I, NP

## 2016-07-29 NOTE — Progress Notes (Signed)
Scheduled for IOL

## 2016-08-02 ENCOUNTER — Encounter (HOSPITAL_COMMUNITY): Payer: Self-pay | Admitting: *Deleted

## 2016-08-02 ENCOUNTER — Telehealth (HOSPITAL_COMMUNITY): Payer: Self-pay | Admitting: *Deleted

## 2016-08-02 NOTE — Telephone Encounter (Signed)
Preadmission screen  

## 2016-08-05 ENCOUNTER — Other Ambulatory Visit: Payer: Self-pay | Admitting: Obstetrics and Gynecology

## 2016-08-05 ENCOUNTER — Encounter (HOSPITAL_COMMUNITY): Payer: Self-pay | Admitting: *Deleted

## 2016-08-05 ENCOUNTER — Inpatient Hospital Stay (HOSPITAL_COMMUNITY)
Admission: AD | Admit: 2016-08-05 | Discharge: 2016-08-09 | DRG: 774 | Disposition: A | Discharge status: Home / Self Care | Payer: Medicaid Other | Source: Ambulatory Visit | Attending: Obstetrics and Gynecology | Admitting: Obstetrics and Gynecology

## 2016-08-05 ENCOUNTER — Ambulatory Visit (INDEPENDENT_AMBULATORY_CARE_PROVIDER_SITE_OTHER): Payer: Medicaid Other | Admitting: Advanced Practice Midwife

## 2016-08-05 VITALS — BP 148/81 | HR 89 | Wt 148.8 lb

## 2016-08-05 DIAGNOSIS — Z3A39 39 weeks gestation of pregnancy: Secondary | ICD-10-CM

## 2016-08-05 DIAGNOSIS — O1493 Unspecified pre-eclampsia, third trimester: Secondary | ICD-10-CM | POA: Diagnosis present

## 2016-08-05 DIAGNOSIS — O1404 Mild to moderate pre-eclampsia, complicating childbirth: Secondary | ICD-10-CM | POA: Diagnosis present

## 2016-08-05 DIAGNOSIS — Z3A40 40 weeks gestation of pregnancy: Secondary | ICD-10-CM | POA: Diagnosis not present

## 2016-08-05 DIAGNOSIS — O163 Unspecified maternal hypertension, third trimester: Secondary | ICD-10-CM

## 2016-08-05 DIAGNOSIS — A6000 Herpesviral infection of urogenital system, unspecified: Secondary | ICD-10-CM | POA: Diagnosis present

## 2016-08-05 DIAGNOSIS — O1494 Unspecified pre-eclampsia, complicating childbirth: Secondary | ICD-10-CM | POA: Diagnosis present

## 2016-08-05 DIAGNOSIS — O9832 Other infections with a predominantly sexual mode of transmission complicating childbirth: Secondary | ICD-10-CM | POA: Diagnosis present

## 2016-08-05 LAB — PROTEIN / CREATININE RATIO, URINE
CREATININE, URINE: 153 mg/dL
Protein Creatinine Ratio: 0.18 mg/mg{Cre} — ABNORMAL HIGH (ref 0.00–0.15)
Total Protein, Urine: 28 mg/dL

## 2016-08-05 LAB — CBC
HEMATOCRIT: 36.8 % (ref 36.0–46.0)
Hemoglobin: 12.9 g/dL (ref 12.0–15.0)
MCH: 34 pg (ref 26.0–34.0)
MCHC: 35.1 g/dL (ref 30.0–36.0)
MCV: 97.1 fL (ref 78.0–100.0)
PLATELETS: 122 10*3/uL — AB (ref 150–400)
RBC: 3.79 MIL/uL — AB (ref 3.87–5.11)
RDW: 13.5 % (ref 11.5–15.5)
WBC: 9 10*3/uL (ref 4.0–10.5)

## 2016-08-05 LAB — COMPREHENSIVE METABOLIC PANEL
ALBUMIN: 3.1 g/dL — AB (ref 3.5–5.0)
ALT: 12 U/L — AB (ref 14–54)
AST: 18 U/L (ref 15–41)
Alkaline Phosphatase: 236 U/L — ABNORMAL HIGH (ref 38–126)
Anion gap: 8 (ref 5–15)
BUN: 8 mg/dL (ref 6–20)
CHLORIDE: 106 mmol/L (ref 101–111)
CO2: 20 mmol/L — ABNORMAL LOW (ref 22–32)
CREATININE: 0.53 mg/dL (ref 0.44–1.00)
Calcium: 8.5 mg/dL — ABNORMAL LOW (ref 8.9–10.3)
GFR calc Af Amer: >60 mL/min (ref >60)
GLUCOSE: 76 mg/dL (ref 65–99)
Potassium: 3.9 mmol/L (ref 3.5–5.1)
Sodium: 134 mmol/L — ABNORMAL LOW (ref 135–145)
Total Bilirubin: 0.5 mg/dL (ref 0.3–1.2)
Total Protein: 6.3 g/dL — ABNORMAL LOW (ref 6.5–8.1)

## 2016-08-05 LAB — POCT URINALYSIS DIP (DEVICE)
BILIRUBIN URINE: NEGATIVE
GLUCOSE, UA: NEGATIVE mg/dL
KETONES UR: NEGATIVE mg/dL
Nitrite: NEGATIVE
PROTEIN: 30 mg/dL — AB
Specific Gravity, Urine: 1.02 (ref 1.005–1.030)
Urobilinogen, UA: 0.2 mg/dL (ref 0.0–1.0)
pH: 7 (ref 5.0–8.0)

## 2016-08-05 LAB — TYPE AND SCREEN
ABO/RH(D): A POS
ANTIBODY SCREEN: NEGATIVE

## 2016-08-05 MED ORDER — FENTANYL CITRATE (PF) 100 MCG/2ML IJ SOLN
100.0000 ug | INTRAMUSCULAR | Status: DC | PRN
  Administered 2016-08-05 – 2016-08-06 (×7): 100 ug via INTRAVENOUS
  Administered 2016-08-07: 50 ug via INTRAVENOUS
  Filled 2016-08-05 (×8): qty 2

## 2016-08-05 MED ORDER — ONDANSETRON HCL 4 MG/2ML IJ SOLN
4.0000 mg | Freq: Four times a day (QID) | INTRAMUSCULAR | Status: DC | PRN
  Administered 2016-08-05 – 2016-08-06 (×2): 4 mg via INTRAVENOUS
  Filled 2016-08-05 (×2): qty 2

## 2016-08-05 MED ORDER — OXYCODONE-ACETAMINOPHEN 5-325 MG PO TABS: 2.0000 | ORAL_TABLET | ORAL | Status: DC | PRN

## 2016-08-05 MED ORDER — OXYCODONE-ACETAMINOPHEN 5-325 MG PO TABS: 1.0000 | ORAL_TABLET | ORAL | Status: DC | PRN

## 2016-08-05 MED ORDER — LIDOCAINE HCL (PF) 1 % IJ SOLN
30.0000 mL | INTRAMUSCULAR | Status: AC | PRN
  Administered 2016-08-07: 30 mL via SUBCUTANEOUS
  Filled 2016-08-05: qty 30

## 2016-08-05 MED ORDER — OXYTOCIN 40 UNITS IN LACTATED RINGERS INFUSION - SIMPLE MED: 2.5000 [IU]/h | INTRAVENOUS | Status: DC

## 2016-08-05 MED ORDER — FLEET ENEMA 7-19 GM/118ML RE ENEM: 1.0000 | ENEMA | Freq: Every day | RECTAL | Status: DC | PRN

## 2016-08-05 MED ORDER — LACTATED RINGERS IV SOLN
INTRAVENOUS | Status: DC
  Administered 2016-08-05 – 2016-08-07 (×4): via INTRAVENOUS

## 2016-08-05 MED ORDER — OXYTOCIN BOLUS FROM INFUSION
500.0000 mL | Freq: Once | INTRAVENOUS | Status: AC
  Administered 2016-08-07: 500 mL via INTRAVENOUS

## 2016-08-05 MED ORDER — MISOPROSTOL 25 MCG QUARTER TABLET
25.0000 ug | ORAL_TABLET | ORAL | Status: DC | PRN
  Administered 2016-08-05 – 2016-08-06 (×5): 25 ug via VAGINAL
  Filled 2016-08-05 (×5): qty 1

## 2016-08-05 MED ORDER — TERBUTALINE SULFATE 1 MG/ML IJ SOLN
0.2500 mg | Freq: Once | INTRAMUSCULAR | Status: DC | PRN
  Filled 2016-08-05: qty 1

## 2016-08-05 MED ORDER — LACTATED RINGERS IV SOLN
500.0000 mL | INTRAVENOUS | Status: DC | PRN
  Administered 2016-08-07: 500 mL via INTRAVENOUS

## 2016-08-05 MED ORDER — SOD CITRATE-CITRIC ACID 500-334 MG/5ML PO SOLN: 30.0000 mL | ORAL | Status: DC | PRN

## 2016-08-05 MED ORDER — ACETAMINOPHEN 325 MG PO TABS: 650.0000 mg | ORAL_TABLET | ORAL | Status: DC | PRN

## 2016-08-05 MED ORDER — FENTANYL CITRATE (PF) 100 MCG/2ML IJ SOLN
INTRAMUSCULAR | Status: AC
  Filled 2016-08-05: qty 2

## 2016-08-05 NOTE — Progress Notes (Signed)
Educated pt Rooming In and [employer] Baby Supplement

## 2016-08-05 NOTE — Progress Notes (Signed)
   PRENATAL VISIT NOTE  Subjective:  Beth Carr is a 21 y.o. G1P0000 at 5458w6d being seen today for ongoing prenatal care.  She is currently monitored for the following issues for this low-risk pregnancy and has Supervision of normal pregnancy and Genital herpes affecting pregnancy on her problem list.  Patient reports dull h/a x 3 days, blurred vision/difficulty focusing yesterday, and swelling in her feet and ankles that does not go down with elevation x 3 days.  Contractions: Irregular. Vag. Bleeding: None.  Movement: Present. Denies leaking of fluid.   The following portions of the patient's history were reviewed and updated as appropriate: allergies, current medications, past family history, past medical history, past social history, past surgical history and problem list. Problem list updated.  Objective:   Vitals:   08/05/16 0938 08/05/16 0940  BP: (!) 142/80 (!) 148/81  Pulse: 79 89  Weight: 148 lb 12.8 oz (67.5 kg)     Fetal Status: Fetal Heart Rate (bpm): 151   Movement: Present     General:  Alert, oriented and cooperative. Patient is in no acute distress.  Skin: Skin is warm and dry. No rash noted.   Cardiovascular: Normal heart rate noted  Respiratory: Normal respiratory effort, no problems with respiration noted  Abdomen: Soft, gravid, appropriate for gestational age.  Pain/Pressure: Present     Pelvic: Cervical exam performed        Extremities: Normal range of motion.  Edema: Trace  Mental Status:  Normal mood and affect. Normal behavior. Normal judgment and thought content.   Assessment and Plan:  Pregnancy: G1P0000 at 4458w6d  1. Hypertension affecting pregnancy in third trimester --First elevated BP during pregnancy today. Pt did have BP 150s/90s at home yesterday.   --She reports dull h/a x 3 days, some visual disturbances yesterday, and increased swelling in her feet/ankles x 3 days that does not resolve with elevation. --Consult Dr Vergie LivingPickens with assessment  and findings.  Pt to MAU for further evaluation.  Term labor symptoms and general obstetric precautions including but not limited to vaginal bleeding, contractions, leaking of fluid and fetal movement were reviewed in detail with the patient. Please refer to After Visit Summary for other counseling recommendations.   To MAU for further evaluation   Sharen CounterLisa Leftwich-Kirby, CNM

## 2016-08-05 NOTE — H&P (Signed)
Obstetric History and Physical  Beth Carr is a 21 y.o. G1P0000 with IUP at [redacted]w[redacted]d presenting for IOL for preeclampsia. Patient states she has been having  none contractions, none vaginal bleeding, intact membranes, with active fetal movement.    She endorses new-onset headaches, blurry vision, and swelling over the last 3-4 days. BP was elevated in clinic so sent over for induction. Denies any symptoms currently.  Prenatal Course Source of Care: [facility]  with onset of care at 12 weeks Dating: By LMP --->  Estimated Date of Delivery: 08/06/16 Pregnancy complications or risks: Patient Active Problem List   Diagnosis Date Noted  . Preeclampsia, third trimester 08/05/2016  . Supervision of normal pregnancy 01/27/2016  . Genital herpes affecting pregnancy 01/27/2016   She plans to breastfeed She desires Nexplanon for postpartum contraception.   Sono:    @[redacted]w[redacted]d , CWD, normal anatomy, breech presentation, 986g, 75% EFW  Prenatal labs and studies: ABO, Rh: --/--/A POS (08/02 1126) Antibody: NEG (08/02 1126) Rubella: 1.14 (01/23 1056) RPR: Non Reactive (04/30 0931)  HBsAg: NEGATIVE (01/23 1056)  HIV: NONREACTIVE (01/23 1056)  ZOX:WRUEAVWU (07/09 0000) 1 hr Glucola  normal Genetic screening normal Anatomy US normal  Prenatal Transfer Tool  Maternal Diabetes: No Genetic Screening: Normal Maternal Ultrasounds/Referrals: Normal Fetal Ultrasounds or other Referrals:  None Maternal Substance Abuse:  No Significant Maternal Medications:  Meds include: Other: Valtrex Significant Maternal Lab Results: Lab values include: Group B Strep negative  Past Medical History:  Diagnosis Date  . Asthma   . Dysmenorrhea    Tired OCPs in past X2 but reports worsening of dysmenorrhea and worsening of clots  . Genital herpes     Past Surgical History:  Procedure Laterality Date  . TOOTH EXTRACTION      OB History  Gravida Para Term Preterm AB Living  1 0 0 0 0 0  SAB TAB Ectopic Multiple  Live Births  0 0 0 0 0    # Outcome Date GA Lbr Len/2nd Weight Sex Delivery Anes PTL Lv  1 Current               Social History   Social History  . Marital status: Single    Spouse name: N/A  . Number of children: N/A  . Years of education: N/A   Social History Main Topics  . Smoking status: Never Smoker  . Smokeless tobacco: Never Used  . Alcohol use No  . Drug use: No  . Sexual activity: Yes    Birth control/ protection: None   Other Topics Concern  . None   Social History Narrative   Lives with Aunt in [place] with younger brother and sister.  Mother lives in [place].     Graduated early from home school.  Going to community college starting Fall 2013.    Family History  Problem Relation Age of Onset  . Hypertension Mother        gestational  . Diabetes Maternal Grandfather     Prescriptions Prior to Admission  Medication Sig Dispense Refill Last Dose  . calcium carbonate (TUMS - DOSED IN MG ELEMENTAL CALCIUM) 500 MG chewable tablet Chew 1 tablet by mouth 2 (two) times daily as needed for indigestion or heartburn.   Past Week at Unknown time  . cyclobenzaprine (FLEXERIL) 10 MG tablet Take 1 tablet (10 mg total) by mouth every 8 (eight) hours as needed for muscle spasms. 30 tablet 0 08/04/2016 at Unknown time  . Doxylamine-Pyridoxine (DICLEGIS) 10-10 MG  TBEC Take 10 mg by mouth 3 (three) times daily as needed (for nausea).    Past Week at Unknown time  . Prenatal Multivit-Min-Fe-FA (PRENATAL VITAMINS) 0.8 MG tablet Take 1 tablet by mouth daily. 30 tablet 12 08/05/2016 at Unknown time  . valACYclovir (VALTREX) 500 MG tablet Take 1 tablet (500 mg total) by mouth 2 (two) times daily. Until delivery of baby 60 tablet 1 08/05/2016 at Unknown time  . acetaminophen (TYLENOL) 500 MG tablet Take 500 mg by mouth every 6 (six) hours as needed for mild pain.   prn  . albuterol (PROVENTIL HFA;VENTOLIN HFA) 108 (90 BASE) MCG/ACT inhaler Inhale 2 puffs into the lungs every 4 (four)  hours as needed for wheezing or shortness of breath. 1 Inhaler 0 prn    No Known Allergies  Review of Systems: Negative except for what is mentioned in HPI.  Physical Exam: BP (!) 151/87   Pulse 71   Temp 98.1 F (36.7 C) (Oral)   Resp 18   Ht 5\' 3"  (1.6 m)   Wt 149 lb (67.6 kg)   LMP 10/31/2015 (Exact Date)   SpO2 100%   BMI 26.39 kg/m  CONSTITUTIONAL: Well-developed, well-nourished female in no acute distress.  HENT:  Normocephalic, atraumatic, External right and left ear normal. Oropharynx is clear and moist EYES: Conjunctivae and EOM are normal. Pupils are equal, round, and reactive to light. No scleral icterus.  NECK: Normal range of motion, supple, no masses SKIN: Skin is warm and dry. No rash noted. Not diaphoretic. No erythema. No pallor. NEUROLOGIC: Alert and oriented to person, place, and time. Normal reflexes, muscle tone coordination. No cranial nerve deficit noted. PSYCHIATRIC: Normal mood and affect. Normal behavior. Normal judgment and thought content. CARDIOVASCULAR: Normal heart rate noted, regular rhythm RESPIRATORY: Effort and breath sounds normal, no problems with respiration noted ABDOMEN: Soft, nontender, nondistended, gravid. MUSCULOSKELETAL: Normal range of motion. No edema and no tenderness. 2+ distal pulses.  Dilation: Fingertip Effacement (%): 70 Station: -3 Presentation: Vertex Exam by:: Dr. Vergie LivingPickens Presentation: cephalic FHT:  Baseline rate 145 bpm   Variability moderate  Accelerations present   Decelerations none Contractions: irregular   Pertinent Labs/Studies:   Results for orders placed or performed during the hospital encounter of 08/05/16 (from the past 24 hour(s))  Protein / creatinine ratio, urine     Status: Abnormal   Collection Time: 08/05/16 10:45 AM  Result Value Ref Range   Creatinine, Urine 153.00 mg/dL   Total Protein, Urine 28 mg/dL   Protein Creatinine Ratio 0.18 (H) 0.00 - 0.15 mg/mg[Cre]  CBC     Status: Abnormal    Collection Time: 08/05/16 11:26 AM  Result Value Ref Range   WBC 9.0 4.0 - 10.5 K/uL   RBC 3.79 (L) 3.87 - 5.11 MIL/uL   Hemoglobin 12.9 12.0 - 15.0 g/dL   HCT 16.136.8 09.636.0 - 04.546.0 %   MCV 97.1 78.0 - 100.0 fL   MCH 34.0 26.0 - 34.0 pg   MCHC 35.1 30.0 - 36.0 g/dL   RDW 40.913.5 81.111.5 - 91.415.5 %   Platelets 122 (L) 150 - 400 K/uL  Comprehensive metabolic panel     Status: Abnormal   Collection Time: 08/05/16 11:26 AM  Result Value Ref Range   Sodium 134 (L) 135 - 145 mmol/L   Potassium 3.9 3.5 - 5.1 mmol/L   Chloride 106 101 - 111 mmol/L   CO2 20 (L) 22 - 32 mmol/L   Glucose, Bld 76 65 - 99 mg/dL  BUN 8 6 - 20 mg/dL   Creatinine, Ser 4.540.53 0.44 - 1.00 mg/dL   Calcium 8.5 (L) 8.9 - 10.3 mg/dL   Total Protein 6.3 (L) 6.5 - 8.1 g/dL   Albumin 3.1 (L) 3.5 - 5.0 g/dL   AST 18 15 - 41 U/L   ALT 12 (L) 14 - 54 U/L   Alkaline Phosphatase 236 (H) 38 - 126 U/L   Total Bilirubin 0.5 0.3 - 1.2 mg/dL   GFR calc non Af Amer >60 >60 mL/min   GFR calc Af Amer >60 >60 mL/min   Anion gap 8 5 - 15  Type and screen [facility]      Status: None   Collection Time: 08/05/16 11:26 AM  Result Value Ref Range   ABO/RH(D) A POS    Antibody Screen NEG    Sample Expiration 08/08/2016     Assessment : Beth Carr is a 21 y.o. G1P0000 at 8517w6d being admitted for induction of labor due to preeclampsia.  Plan: Labor: Induction as ordered as per protocol. Will start with cytotec and place FB when able.  Analgesia as needed. FWB: Reassuring fetal heart tracing.  GBS negative Delivery plan: Hopeful for vaginal delivery  Caryl AdaJazma Phelps, DO OB Fellow Faculty Practice, [facility] 08/05/2016, 6:19 PM

## 2016-08-05 NOTE — MAU Provider Note (Signed)
History     CSN: [id number]  Arrival date and time: 08/05/16 1032   First Provider Initiated Contact with Patient 08/05/16 1145      Chief Complaint  Patient presents with  . Headache  . elevated blood pressure   HPI   Ms.Beth Carr is a 21 y.o. female G1P0000 @ 34w6dhere in MAU for preeclampsia evaluation. She was seen in the clinic this morning for her regular scheduled appointment and had a blood pressure that was elevated.  She also reports that she checked her BP last night at home and it read 1619systolic and states she cant remember the bottom number. She also checked it at home on 7/31 it read 146/87.  Says she has had a dull, frontal HA for the last 2 nights. She is also concerned about swelling in her legs.   OB History    Gravida Para Term Preterm AB Living   1 0 0 0 0 0   SAB TAB Ectopic Multiple Live Births   0 0 0 0 0      Past Medical History:  Diagnosis Date  . Asthma   . Dysmenorrhea    Tired OCPs in past X2 but reports worsening of dysmenorrhea and worsening of clots  . Genital herpes     Past Surgical History:  Procedure Laterality Date  . TOOTH EXTRACTION      Family History  Problem Relation Age of Onset  . Hypertension Mother        gestational  . Diabetes Maternal Grandfather     Social History  Substance Use Topics  . Smoking status: Never Smoker  . Smokeless tobacco: Never Used  . Alcohol use No    Allergies: No Known Allergies  Prescriptions Prior to Admission  Medication Sig Dispense Refill Last Dose  . calcium carbonate (TUMS - DOSED IN MG ELEMENTAL CALCIUM) 500 MG chewable tablet Chew 1 tablet by mouth 2 (two) times daily as needed for indigestion or heartburn.   Past Week at Unknown time  . cyclobenzaprine (FLEXERIL) 10 MG tablet Take 1 tablet (10 mg total) by mouth every 8 (eight) hours as needed for muscle spasms. 30 tablet 0 08/04/2016 at Unknown time  . Doxylamine-Pyridoxine (DICLEGIS) 10-10 MG TBEC Take 10 mg by mouth  3 (three) times daily as needed (for nausea).    Past Week at Unknown time  . Prenatal Multivit-Min-Fe-FA (PRENATAL VITAMINS) 0.8 MG tablet Take 1 tablet by mouth daily. 30 tablet 12 08/05/2016 at Unknown time  . valACYclovir (VALTREX) 500 MG tablet Take 1 tablet (500 mg total) by mouth 2 (two) times daily. Until delivery of baby 60 tablet 1 08/05/2016 at Unknown time  . acetaminophen (TYLENOL) 500 MG tablet Take 500 mg by mouth every 6 (six) hours as needed for mild pain.   prn  . albuterol (PROVENTIL HFA;VENTOLIN HFA) 108 (90 BASE) MCG/ACT inhaler Inhale 2 puffs into the lungs every 4 (four) hours as needed for wheezing or shortness of breath. 1 Inhaler 0 prn   Results for orders placed or performed during the hospital encounter of 08/05/16 (from the past 48 hour(s))  Protein / creatinine ratio, urine     Status: Abnormal   Collection Time: 08/05/16 10:45 AM  Result Value Ref Range   Creatinine, Urine 153.00 mg/dL   Total Protein, Urine 28 mg/dL    Comment: NO NORMAL RANGE ESTABLISHED FOR THIS TEST   Protein Creatinine Ratio 0.18 (H) 0.00 - 0.15 mg/mg[Cre]  CBC  Status: Abnormal   Collection Time: 08/05/16 11:26 AM  Result Value Ref Range   WBC 9.0 4.0 - 10.5 K/uL   RBC 3.79 (L) 3.87 - 5.11 MIL/uL   Hemoglobin 12.9 12.0 - 15.0 g/dL   HCT 36.8 36.0 - 46.0 %   MCV 97.1 78.0 - 100.0 fL   MCH 34.0 26.0 - 34.0 pg   MCHC 35.1 30.0 - 36.0 g/dL   RDW 13.5 11.5 - 15.5 %   Platelets 122 (L) 150 - 400 K/uL  Comprehensive metabolic panel     Status: Abnormal   Collection Time: 08/05/16 11:26 AM  Result Value Ref Range   Sodium 134 (L) 135 - 145 mmol/L   Potassium 3.9 3.5 - 5.1 mmol/L   Chloride 106 101 - 111 mmol/L   CO2 20 (L) 22 - 32 mmol/L   Glucose, Bld 76 65 - 99 mg/dL   BUN 8 6 - 20 mg/dL   Creatinine, Ser 0.53 0.44 - 1.00 mg/dL   Calcium 8.5 (L) 8.9 - 10.3 mg/dL   Total Protein 6.3 (L) 6.5 - 8.1 g/dL   Albumin 3.1 (L) 3.5 - 5.0 g/dL   AST 18 15 - 41 U/L   ALT 12 (L) 14 - 54 U/L    Alkaline Phosphatase 236 (H) 38 - 126 U/L   Total Bilirubin 0.5 0.3 - 1.2 mg/dL   GFR calc non Af Amer >60 >60 mL/min   GFR calc Af Amer >60 >60 mL/min    Comment: (NOTE) The eGFR has been calculated using the CKD EPI equation. This calculation has not been validated in all clinical situations. eGFR's persistently <60 mL/min signify possible Chronic Kidney Disease.    Anion gap 8 5 - 15   Review of Systems  Eyes: Positive for visual disturbance (Occaisonal blurry vision. None now).  Cardiovascular: Positive for leg swelling.  Gastrointestinal: Negative for abdominal pain.  Neurological: Positive for headaches (Has not tried taking tylenol ).   Physical Exam   Blood pressure 135/90, pulse 80, temperature 98.3 F (36.8 C), temperature source Oral, resp. rate 16, weight 149 lb 0.6 oz (67.6 kg), last menstrual period 10/31/2015, SpO2 100 %.  Patient Vitals for the past 24 hrs:  BP Temp Temp src Pulse Resp SpO2 Weight  08/05/16 1215 (!) 144/95 - - 67 - - -  08/05/16 1204 (!) 142/93 - - 78 - - -  08/05/16 1145 139/86 - - 72 - - -  08/05/16 1130 135/90 - - 80 - - -  08/05/16 1117 126/78 - - 94 - - -  08/05/16 1101 134/90 - - 97 - - -  08/05/16 1051 (!) 147/88 98.3 F (36.8 C) Oral 71 16 100 % 149 lb 0.6 oz (67.6 kg)    Physical Exam  Constitutional: She is oriented to person, place, and time. She appears well-developed and well-nourished. No distress.  HENT:  Head: Normocephalic.  Eyes: Pupils are equal, round, and reactive to light.  Cardiovascular: Normal rate.   Respiratory: Effort normal and breath sounds normal.  GI: Soft. She exhibits no distension. There is no tenderness. There is no rebound.  Musculoskeletal: Normal range of motion.  Neurological: She is alert and oriented to person, place, and time. She has normal reflexes.  Negative clonus   Skin: Skin is warm. She is not diaphoretic.  Psychiatric: Her behavior is normal.   Fetal Tracing: Baseline: 140  bpm Variability: Moderate  Accelerations: 15x15 Decelerations: none Toco: Occasional   MAU Course  Procedures  None  MDM  PIH labs ordered 1+ protein on dipstick  PCR Admit to BS for preeclampsia @ [redacted]w[redacted]d Discussed admission with Dr. PIlda Bassetand Dr. PGerarda Fraction   Assessment and Plan   A:  1. Preeclampsia, third trimester     P:  Admit to BS GBS negative  Aveah Castell, JArtist Pais NP 08/05/2016 12:40 PM

## 2016-08-05 NOTE — Anesthesia Pain Management Evaluation Note (Signed)
  CRNA Pain Management Visit Note  Patient: Beth Carr A Bassette, 21 y.o., female  "Hello I am a [facility] the [facility]. We have an anesthesia team available at all times to provide care throughout the hospital, including epidural management and anesthesia for C-section. I don't know your plan for the delivery whether it a natural birth, water birth, IV sedation, nitrous supplementation, doula or epidural, but we want to meet your pain goals."   1.Was your pain managed to your expectations on prior hospitalizations?   No prior hospitalizations  2.What is your expectation for pain management during this hospitalization?     Epidural and IV pain meds  3.How can we help you reach that goal? IV pain meds, epidural when ready.  Record the patient's initial score and the patient's pain goal.   Pain: 7  Pain Goal: 10 [facility] wants you to be able to say your pain was always managed very well.  Alethea Terhaar L 08/05/2016

## 2016-08-05 NOTE — Progress Notes (Signed)
Notified of MAU charge, Trula OreChristina, provider has advised pre-eclampsia evaluation.  Pt walked to MAU.

## 2016-08-05 NOTE — MAU Note (Signed)
Pt requests to go to her car to lock something up, provider notified and okays request

## 2016-08-05 NOTE — MAU Note (Signed)
Sent from clinic for evaluation of elevated blood pressure BP in clinic 142/80, 148/88  +headache x3 days Rating pain 4/10  +blurred vision States having a hard time reading

## 2016-08-06 ENCOUNTER — Inpatient Hospital Stay (HOSPITAL_COMMUNITY): Payer: Medicaid Other | Admitting: Anesthesiology

## 2016-08-06 LAB — CBC
HEMATOCRIT: 39.2 % (ref 36.0–46.0)
HEMOGLOBIN: 13.9 g/dL (ref 12.0–15.0)
MCH: 34.2 pg — AB (ref 26.0–34.0)
MCHC: 35.5 g/dL (ref 30.0–36.0)
MCV: 96.3 fL (ref 78.0–100.0)
Platelets: 123 10*3/uL — ABNORMAL LOW (ref 150–400)
RBC: 4.07 MIL/uL (ref 3.87–5.11)
RDW: 13.1 % (ref 11.5–15.5)
WBC: 11.6 10*3/uL — ABNORMAL HIGH (ref 4.0–10.5)

## 2016-08-06 LAB — HIV ANTIBODY (ROUTINE TESTING W REFLEX): HIV SCREEN 4TH GENERATION: NONREACTIVE

## 2016-08-06 LAB — RPR: RPR Ser Ql: NONREACTIVE

## 2016-08-06 MED ORDER — LIDOCAINE HCL (PF) 1 % IJ SOLN
INTRAMUSCULAR | Status: DC | PRN
  Administered 2016-08-06: 6 mL via EPIDURAL
  Administered 2016-08-06: 4 mL

## 2016-08-06 MED ORDER — PHENYLEPHRINE 40 MCG/ML (10ML) SYRINGE FOR IV PUSH (FOR BLOOD PRESSURE SUPPORT)
80.0000 ug | PREFILLED_SYRINGE | INTRAVENOUS | Status: DC | PRN
  Filled 2016-08-06: qty 5

## 2016-08-06 MED ORDER — PHENYLEPHRINE 40 MCG/ML (10ML) SYRINGE FOR IV PUSH (FOR BLOOD PRESSURE SUPPORT)
80.0000 ug | PREFILLED_SYRINGE | INTRAVENOUS | Status: DC | PRN
  Filled 2016-08-06: qty 10
  Filled 2016-08-06: qty 5

## 2016-08-06 MED ORDER — FENTANYL 2.5 MCG/ML BUPIVACAINE 1/10 % EPIDURAL INFUSION (WH - ANES)
14.0000 mL/h | INTRAMUSCULAR | Status: DC | PRN
Start: 2016-08-06 — End: 2016-08-07
  Administered 2016-08-06 – 2016-08-07 (×5): 14 mL/h via EPIDURAL
  Filled 2016-08-06 (×4): qty 100

## 2016-08-06 MED ORDER — LACTATED RINGERS IV SOLN
500.0000 mL | Freq: Once | INTRAVENOUS | Status: AC
  Administered 2016-08-06: 500 mL via INTRAVENOUS

## 2016-08-06 MED ORDER — EPHEDRINE 5 MG/ML INJ
10.0000 mg | INTRAVENOUS | Status: DC | PRN
  Filled 2016-08-06: qty 2

## 2016-08-06 MED ORDER — DIPHENHYDRAMINE HCL 50 MG/ML IJ SOLN
12.5000 mg | INTRAMUSCULAR | Status: DC | PRN
  Administered 2016-08-06 (×2): 12.5 mg via INTRAVENOUS
  Filled 2016-08-06: qty 1

## 2016-08-06 NOTE — Progress Notes (Signed)
LABOR PROGRESS NOTE  Beth Carr is a 21 y.o. G1P0000 at 568w0d  admitted for IOL for pre-eclampsia  Subjective: Pain controlled now with epdidural  Objective: BP (!) 145/88   Pulse 63   Temp 98.1 F (36.7 C) (Oral)   Resp 16   Ht 5\' 3"  (1.6 m)   Wt 149 lb (67.6 kg)   LMP 10/31/2015 (Exact Date)   SpO2 100%   BMI 26.39 kg/m  or  Vitals:   08/06/16 1450 08/06/16 1455 08/06/16 1459 08/06/16 1500  BP: (!) 138/92 (!) 137/98 (!) 138/92 (!) 145/88  Pulse: 76 76 68 63  Resp: 18 16 18 16   Temp:  98.1 F (36.7 C)    TempSrc:  Oral    SpO2:      Weight:      Height:        Dilation: 1 Effacement (%): 60, 70 Cervical Position: Middle Station: -1 Presentation: Vertex Exam by:: Dr. Nira Retortegele FHT: baseline rate 145, moderate varibility, +acel, no decel Toco: ctx q2-5 min  Labs: Lab Results  Component Value Date   WBC 11.6 (H) 08/06/2016   HGB 13.9 08/06/2016   HCT 39.2 08/06/2016   MCV 96.3 08/06/2016   PLT 123 (L) 08/06/2016    Patient Active Problem List   Diagnosis Date Noted  . Preeclampsia, third trimester 08/05/2016  . Supervision of normal pregnancy 01/27/2016  . Genital herpes affecting pregnancy 01/27/2016    Assessment / Plan: 21 y.o. G1P0000 at 2668w0d here for IOL for pre-eclampsia  Labor: S/p cytotec x 4 (last at 0830). FB placed at 1530. Will hold of on cytotec as pt having ctx q2-5 min Fetal Wellbeing:  Cat I Pain Control:  Epidural Anticipated MOD:  SVD  Frederik PearJulie P Dalis Beers, MD 08/06/2016, 3:41 PM

## 2016-08-06 NOTE — Progress Notes (Addendum)
Beth Carr is a 21 y.o. G1P0000 at 2842w0d  admitted for induction of labor due to pre-eclampsia.  Subjective: Patient reports pain with the contractions, managed with fentanyl but shortlived. Will be getting epidural after cervical dilation to 6 cm.    Objective: BP (!) 132/93   Pulse (!) 59   Temp 97.6 F (36.4 C) (Oral)   Resp 18   Ht 5\' 3"  (1.6 m)   Wt 67.6 kg (149 lb)   LMP 10/31/2015 (Exact Date)   SpO2 100%   BMI 26.39 kg/m  No intake/output data recorded. No intake/output data recorded.  FHT:  FHR: 135 bpm, variability: moderate,  accelerations:  Present,  decelerations:  Absent UC:   regular, every 2-4 minutes SVE:   Dilation: 1.5 Effacement (%): 50 Station: 0 Exam by:: Tobe SosLuft, RN  Labs: Lab Results  Component Value Date   WBC 9.0 08/05/2016   HGB 12.9 08/05/2016   HCT 36.8 08/05/2016   MCV 97.1 08/05/2016   PLT 122 (L) 08/05/2016    Assessment / Plan: Induction of labor due to preeclampsia,  progressing well on pitocin  Labor: not in labor; will augment with foley bulb placement ctyotec 25mcg placed at 0830 Preeclampsia:  no signs or symptoms of toxicity Fetal Wellbeing:  Category I Pain Control:  Epidural and IV pain meds I/D:  n/a Anticipated MOD:  NSVD  Jayma Volpi C Uma Jerde 08/06/2016, 10:09 AM

## 2016-08-06 NOTE — Progress Notes (Signed)
LABOR PROGRESS NOTE  Beth Carr is a 21 y.o. G1P0000 at 7898w0d  admitted for IOL for pre-eclampsia  Subjective: Pain controlled now with epdidural  Objective: BP 140/84   Pulse 60   Temp 98 F (36.7 C) (Oral)   Resp 16   Ht 5\' 3"  (1.6 m)   Wt 149 lb (67.6 kg)   LMP 10/31/2015 (Exact Date)   SpO2 100%   BMI 26.39 kg/m  or  Vitals:   08/06/16 1700 08/06/16 1730 08/06/16 1800 08/06/16 1830  BP: 123/70 122/73 (!) 148/100 140/84  Pulse: 62 67 63 60  Resp: 18 18 18 16   Temp:   98 F (36.7 C)   TempSrc:   Oral   SpO2:      Weight:      Height:        SVE: 1.5/80/-3 FHT: baseline rate 135, moderate varibility, +acel, no decel Toco: ctx q3-5 min  Labs: Lab Results  Component Value Date   WBC 11.6 (H) 08/06/2016   HGB 13.9 08/06/2016   HCT 39.2 08/06/2016   MCV 96.3 08/06/2016   PLT 123 (L) 08/06/2016    Patient Active Problem List   Diagnosis Date Noted  . Preeclampsia, third trimester 08/05/2016  . Supervision of normal pregnancy 01/27/2016  . Genital herpes affecting pregnancy 01/27/2016    Assessment / Plan: 21 y.o. G1P0000 at 8398w0d here for IOL for pre-eclampsia  Labor: FB placed at 1530. Cytotec #5 placed at 1830 Fetal Wellbeing:  Cat I Pain Control:  Epidural Anticipated MOD:  SVD  Frederik PearJulie P Degele, MD 08/06/2016, 6:34 PM

## 2016-08-06 NOTE — Anesthesia Procedure Notes (Signed)
Epidural Patient location during procedure: OB  Staffing Anesthesiologist: Osmar Howton  Preanesthetic Checklist Completed: patient identified, site marked, surgical consent, pre-op evaluation, timeout performed, IV checked, risks and benefits discussed and monitors and equipment checked  Epidural Patient position: sitting Prep: site prepped and draped and DuraPrep Patient monitoring: continuous pulse ox and blood pressure Approach: midline Location: L3-L4 Injection technique: LOR air  Needle:  Needle type: Tuohy  Needle gauge: 17 G Needle length: 9 cm and 9 Needle insertion depth: 4 cm Catheter type: closed end flexible Catheter size: 19 Gauge Catheter at skin depth: 10 cm Test dose: negative  Assessment Events: blood not aspirated, injection not painful, no injection resistance, negative IV test and no paresthesia  Additional Notes Dosing of Epidural:  1st dose, through catheter .............................................  Xylocaine 40 mg  2nd dose, through catheter, after waiting 3 minutes.........Xylocaine 60 mg    As each dose occurred, patient was free of IV sx; and patient exhibited no evidence of SA injection.  Patient is more comfortable after epidural dosed. Please see RN's note for documentation of vital signs,and FHR which are stable.  Patient reminded not to try to ambulate with numb legs, and that an RN must be present when she attempts to get up.        

## 2016-08-06 NOTE — Anesthesia Preprocedure Evaluation (Signed)
Anesthesia Evaluation  Patient identified by MRN, date of birth, ID band Patient awake    Reviewed: Allergy & Precautions, H&P , Patient's Chart, lab work & pertinent test results  Airway Mallampati: II  TM Distance: >3 FB Neck ROM: full    Dental  (+) Teeth Intact   Pulmonary    breath sounds clear to auscultation       Cardiovascular  Rhythm:regular Rate:Normal     Neuro/Psych    GI/Hepatic   Endo/Other    Renal/GU      Musculoskeletal   Abdominal   Peds  Hematology   Anesthesia Other Findings       Reproductive/Obstetrics (+) Pregnancy                             Anesthesia Physical  Anesthesia Plan  ASA: II  Anesthesia Plan: Epidural   Post-op Pain Management:    Induction:   PONV Risk Score and Plan:   Airway Management Planned:   Additional Equipment:   Intra-op Plan:   Post-operative Plan:   Informed Consent: I have reviewed the patients History and Physical, chart, labs and discussed the procedure including the risks, benefits and alternatives for the proposed anesthesia with the patient or authorized representative who has indicated his/her understanding and acceptance.     Dental Advisory Given  Plan Discussed with:   Anesthesia Plan Comments: (Labs checked- platelets confirmed with RN in room. Fetal heart tracing, per RN, reported to be stable enough for sitting procedure. Discussed epidural, and patient consents to the procedure:  included risk of possible headache,backache, failed block, allergic reaction, and nerve injury. This patient was asked if she had any questions or concerns before the procedure started.)        Anesthesia Quick Evaluation  

## 2016-08-06 NOTE — Progress Notes (Signed)
   Beth Carr is a [age] y.o. G1P0000 at 2649w0d  admitted for  IOL for pre-eclampsia with severe features.   Subjective: Patient coping well; feeling some pain with contractions. Recently had fentanyl.   Objective: Vitals:   08/06/16 0016 08/06/16 0236 08/06/16 0238 08/06/16 0300  BP: (!) 157/86 (!) 162/96 (!) 149/86   Pulse: (!) 56 (!) 56 (!) 55   Resp: 18     Temp:    (!) 97.4 F (36.3 C)  TempSrc:    Oral  SpO2:      Weight:      Height:       No intake/output data recorded.  FHT:  FHR: 130 bpm, variability: moderate,  accelerations:  Present,  decelerations:  Absent UC:   2 min SVE:   Dilation: Fingertip Effacement (%): 50 Station: -3 Exam by:: Sandy SalaamLynnsey Johnson, RN  Pitocin @  mu/min  Labs: Lab Results  Component Value Date   WBC 9.0 08/05/2016   HGB 12.9 08/05/2016   HCT 36.8 08/05/2016   MCV 97.1 08/05/2016   PLT 122 (L) 08/05/2016    Assessment / Plan: IOL in early labor, s/p 3 doses of cytotec  Labor: early labor Fetal Wellbeing:  Category I Pain Control:  IV pain meds Anticipated MOD:  NSVD  Attempted Foley bulb but could not advance past the internal os. Will continue with cytotec (next dose at 6 am).  Charlesetta GaribaldiKathryn Lorraine Kooistra CNM 08/06/2016, 5:03 AM

## 2016-08-06 NOTE — Progress Notes (Signed)
LABOR PROGRESS NOTE  Beth Carr is a 21 y.o. G1P0000 at 1418w0d  admitted for IOL for pre-eclampsia  Subjective: Having some nausea. Comfortable with epidural  Objective: BP (!) 143/97   Pulse (!) 59   Temp 98 F (36.7 C) (Oral)   Resp 18   Ht 5\' 3"  (1.6 m)   Wt 149 lb (67.6 kg)   LMP 10/31/2015 (Exact Date)   SpO2 100%   BMI 26.39 kg/m  or  Vitals:   08/06/16 1730 08/06/16 1800 08/06/16 1830 08/06/16 1900  BP: 122/73 (!) 148/100 140/84 (!) 143/97  Pulse: 67 63 60 (!) 59  Resp: 18 18 16 18   Temp:  98 F (36.7 C)    TempSrc:  Oral    SpO2:      Weight:      Height:        SVE: Dilation: 1.5 Effacement (%): 80 Cervical Position: Middle Station: -1 Presentation: Vertex Exam by:: Dr. Nira Retortegele  FHT: baseline rate 140, moderate varibility, +acel, no decel Toco: ctx q2-5 min  Labs: Lab Results  Component Value Date   WBC 11.6 (H) 08/06/2016   HGB 13.9 08/06/2016   HCT 39.2 08/06/2016   MCV 96.3 08/06/2016   PLT 123 (L) 08/06/2016    Patient Active Problem List   Diagnosis Date Noted  . Preeclampsia, third trimester 08/05/2016  . Supervision of normal pregnancy 01/27/2016  . Genital herpes affecting pregnancy 01/27/2016    Assessment / Plan: 21 y.o. G1P0000 at 3518w0d here for IOL for pre-eclampsia.   Labor: FB in place. Continue Cytotec. Not in active labor. Pre-E: No ssx of pre e. Stable BP in mild range. Fetal Wellbeing:  Cat I Pain Control:  Epidural Anticipated MOD:  SVD  Beth Giovanniorey P Alayzia Pavlock, MD 08/06/2016, 9:22 PM

## 2016-08-07 ENCOUNTER — Encounter (HOSPITAL_COMMUNITY): Payer: Self-pay | Admitting: *Deleted

## 2016-08-07 DIAGNOSIS — Z3A40 40 weeks gestation of pregnancy: Secondary | ICD-10-CM

## 2016-08-07 DIAGNOSIS — O1494 Unspecified pre-eclampsia, complicating childbirth: Secondary | ICD-10-CM

## 2016-08-07 MED ORDER — ONDANSETRON HCL 4 MG/2ML IJ SOLN: 4.0000 mg | INTRAMUSCULAR | Status: DC | PRN

## 2016-08-07 MED ORDER — COCONUT OIL OIL
1.0000 "application " | TOPICAL_OIL | Status: DC | PRN
  Administered 2016-08-09: 1 via TOPICAL
  Filled 2016-08-07: qty 120

## 2016-08-07 MED ORDER — IBUPROFEN 600 MG PO TABS
600.0000 mg | ORAL_TABLET | Freq: Four times a day (QID) | ORAL | Status: DC
  Administered 2016-08-07 – 2016-08-09 (×8): 600 mg via ORAL
  Filled 2016-08-07 (×8): qty 1

## 2016-08-07 MED ORDER — BENZOCAINE-MENTHOL 20-0.5 % EX AERO: 1.0000 "application " | INHALATION_SPRAY | CUTANEOUS | Status: DC | PRN

## 2016-08-07 MED ORDER — DIBUCAINE 1 % RE OINT: 1.0000 "application " | TOPICAL_OINTMENT | RECTAL | Status: DC | PRN

## 2016-08-07 MED ORDER — OXYTOCIN 40 UNITS IN LACTATED RINGERS INFUSION - SIMPLE MED
1.0000 m[IU]/min | INTRAVENOUS | Status: DC
  Administered 2016-08-07: 2 m[IU]/min via INTRAVENOUS

## 2016-08-07 MED ORDER — TETANUS-DIPHTH-ACELL PERTUSSIS 5-2.5-18.5 LF-MCG/0.5 IM SUSP: 0.5000 mL | Freq: Once | INTRAMUSCULAR | Status: DC

## 2016-08-07 MED ORDER — FENTANYL CITRATE (PF) 100 MCG/2ML IJ SOLN: 50.0000 ug | Freq: Once | INTRAMUSCULAR | Status: DC

## 2016-08-07 MED ORDER — OXYTOCIN 40 UNITS IN LACTATED RINGERS INFUSION - SIMPLE MED
1.0000 m[IU]/min | INTRAVENOUS | Status: DC
  Administered 2016-08-07: 1 m[IU]/min via INTRAVENOUS
  Filled 2016-08-07: qty 1000

## 2016-08-07 MED ORDER — PRENATAL MULTIVITAMIN CH
1.0000 | ORAL_TABLET | Freq: Every day | ORAL | Status: DC
  Administered 2016-08-08 – 2016-08-09 (×2): 1 via ORAL
  Filled 2016-08-07 (×2): qty 1

## 2016-08-07 MED ORDER — SIMETHICONE 80 MG PO CHEW: 80.0000 mg | CHEWABLE_TABLET | ORAL | Status: DC | PRN

## 2016-08-07 MED ORDER — SENNOSIDES-DOCUSATE SODIUM 8.6-50 MG PO TABS
2.0000 | ORAL_TABLET | ORAL | Status: DC
  Administered 2016-08-07 – 2016-08-08 (×2): 2 via ORAL
  Filled 2016-08-07 (×2): qty 2

## 2016-08-07 MED ORDER — ONDANSETRON HCL 4 MG PO TABS: 4.0000 mg | ORAL_TABLET | ORAL | Status: DC | PRN

## 2016-08-07 MED ORDER — ZOLPIDEM TARTRATE 5 MG PO TABS: 5.0000 mg | ORAL_TABLET | Freq: Every evening | ORAL | Status: DC | PRN

## 2016-08-07 MED ORDER — DIPHENHYDRAMINE HCL 25 MG PO CAPS: 25.0000 mg | ORAL_CAPSULE | Freq: Four times a day (QID) | ORAL | Status: DC | PRN

## 2016-08-07 MED ORDER — WITCH HAZEL-GLYCERIN EX PADS: 1.0000 "application " | MEDICATED_PAD | CUTANEOUS | Status: DC | PRN

## 2016-08-07 MED ORDER — ACETAMINOPHEN 325 MG PO TABS
650.0000 mg | ORAL_TABLET | ORAL | Status: DC | PRN
  Administered 2016-08-07 – 2016-08-08 (×2): 650 mg via ORAL
  Filled 2016-08-07 (×2): qty 2

## 2016-08-07 NOTE — Anesthesia Postprocedure Evaluation (Signed)
Anesthesia Post Note  Patient: Beth Carr  Procedure(s) Performed: * No procedures listed *     Patient location during evaluation: Mother Baby Anesthesia Type: Epidural Level of consciousness: awake Pain management: pain level controlled Vital Signs Assessment: post-procedure vital signs reviewed and stable Respiratory status: spontaneous breathing Postop Assessment: no headache, no backache, epidural receding, patient able to bend at knees, no signs of nausea or vomiting and adequate PO intake Anesthetic complications: no    Last Vitals:  Vitals:   08/07/16 1500 08/07/16 1600  BP: (!) 146/83 (!) 135/92  Pulse: 75 71  Resp: 18 18  Temp: 36.7 C 36.8 C    Last Pain:  Vitals:   08/07/16 1600  TempSrc: Oral  PainSc:    Pain Goal: Patients Stated Pain Goal: 3 (08/06/16 [id number])               Fanny DanceMULLINS,Alleyah Twombly

## 2016-08-07 NOTE — Progress Notes (Signed)
LABOR PROGRESS NOTE  Beth Carr is a 21 y.o. G1P0000 at 5742w0d  admitted for IOL for pre-eclampsia  Subjective: Resting comfortably, CRB in place  Objective: BP 131/90   Pulse (!) 57   Temp 98.3 F (36.8 C) (Oral)   Resp 16   Ht 5\' 3"  (1.6 m)   Wt 149 lb (67.6 kg)   LMP 10/31/2015 (Exact Date)   SpO2 100%   BMI 26.39 kg/m  or  Vitals:   08/07/16 0000 08/07/16 0030 08/07/16 0100 08/07/16 0130  BP: 137/84 128/90 135/88 131/90  Pulse: (!) 57 (!) 59 (!) 55 (!) 57  Resp: 16 16 16 16   Temp:    98.3 F (36.8 C)  TempSrc:    Oral  SpO2:      Weight:      Height:        SVE: Dilation: 2 Effacement (%): 80 Cervical Position: Middle Station: -3 Presentation: Vertex Exam by:: Dr. Nira Retortegele  FHT: baseline rate 140, moderate varibility, +acel, no decel Toco: ctx q2-5 min  Labs: Lab Results  Component Value Date   WBC 11.6 (H) 08/06/2016   HGB 13.9 08/06/2016   HCT 39.2 08/06/2016   MCV 96.3 08/06/2016   PLT 123 (L) 08/06/2016    Patient Active Problem List   Diagnosis Date Noted  . Preeclampsia, third trimester 08/05/2016  . Supervision of normal pregnancy 01/27/2016  . Genital herpes affecting pregnancy 01/27/2016    Assessment / Plan: 21 y.o. G1P0000 at 9342w0d here for IOL for pre-eclampsia.   Labor: FB in place. Continue IOL with pitocin. S/p cytotec Pre-E: No ssx of pre-e. Stable BP in mild range. Fetal Wellbeing:  Cat I Pain Control:  Epidural Anticipated MOD:  SVD  Beth Beth P [name], MD 08/07/2016, 2:23 AM

## 2016-08-07 NOTE — Lactation Note (Signed)
This note was copied from a baby's chart. Lactation Consultation Note  Patient Name: Beth Lenord FellersChassie Worm WUJWJ'XToday's Date: 08/07/2016 Reason for consult: Initial assessment  Initial visit at 4 hours of age.  Mom reports a good feeding, but mom is unsure of how to latch baby because she is new to this. Mom holding baby swaddled and asked for assistance with replacing baby's hat.  Baby noted to have significant head edema, LC placed hat on baby's head.  Mom reports baby has been crying because his head must hurt.   LC discussed feeding cues and offered to assist with latching.  Mom has easily expressed colostrum and with assistance she can express drops of milk.   LC assisted with STS and cross cradle hold.  Baby latched well with wide gape and strong sucking, mom denies pain with latch.  Lc instructed mom on ways to keep baby active during feeding time and to watch jaw for movement.  Baby maintained feeding for 20 minutes with a few re-latches. FOB at bedside supportive.   Mom denies concerns at this time. [facility] resources given and discussed.  Encouraged to feed with early cues on demand.  Early newborn behavior discussed.  Mom to call for assist as needed.     Maternal Data Has patient been taught Hand Expression?: Yes Does the patient have breastfeeding experience prior to this delivery?: No  Feeding Feeding Type: Breast Fed Length of feed: 20 min  LATCH Score Latch: Repeated attempts needed to sustain latch, nipple held in mouth throughout feeding, stimulation needed to elicit sucking reflex.  Audible Swallowing: A few with stimulation  Type of Nipple: Everted at rest and after stimulation  Comfort (Breast/Nipple): Soft / non-tender  Hold (Positioning): Assistance needed to correctly position infant at breast and maintain latch.  LATCH Score: 7  Interventions Interventions: Breast feeding basics reviewed;Assisted with latch;Skin to skin;Breast massage;Hand express;Breast  compression;Adjust position;Expressed milk;Support pillows  Lactation Tools Discussed/Used WIC Program: Yes   Consult Status Consult Status: Follow-up Date: 08/08/16 Follow-up type: In-patient    Odie Rauen, Arvella MerlesJana Lynn 08/07/2016, 6:00 PM

## 2016-08-08 MED ORDER — FUROSEMIDE 10 MG/ML IJ SOLN
20.0000 mg | Freq: Two times a day (BID) | INTRAMUSCULAR | Status: DC
  Filled 2016-08-08: qty 2

## 2016-08-08 MED ORDER — FUROSEMIDE 40 MG PO TABS
40.0000 mg | ORAL_TABLET | Freq: Two times a day (BID) | ORAL | Status: AC
Start: 2016-08-08 — End: 2016-08-08
  Administered 2016-08-08 (×2): 40 mg via ORAL
  Filled 2016-08-08 (×2): qty 1

## 2016-08-08 MED ORDER — POTASSIUM CHLORIDE CRYS ER 20 MEQ PO TBCR
20.0000 meq | EXTENDED_RELEASE_TABLET | Freq: Two times a day (BID) | ORAL | Status: AC
  Administered 2016-08-08 (×2): 20 meq via ORAL
  Filled 2016-08-08 (×2): qty 1

## 2016-08-08 MED ORDER — HYDROCHLOROTHIAZIDE 25 MG PO TABS
25.0000 mg | ORAL_TABLET | Freq: Every day | ORAL | Status: DC
  Administered 2016-08-09: 25 mg via ORAL
  Filled 2016-08-08: qty 1

## 2016-08-08 NOTE — Progress Notes (Signed)
CSW acknowledges consult for MOB regarding hx of THC use. This Clinical research associatewriter presented to bedside to complete assessment; however, MOB noted she would like this Clinical research associatewriter to come back as she had many visitors. Due to [facility] (+) [facility], this writer [facility] and will obtain full assessment of MOB at a later time.   Lashun Mccants, MSW, LCSW-A Clinical Social Worker  [facility]  Office: [phone]

## 2016-08-08 NOTE — Progress Notes (Signed)
Dr. Nira Retortegele on department and notified of patients BP. She will assess patient now, and I will recheck BP in 1 hour and then they will reevaluate plan of care. Pt asymptomatic. Beth Carr, Tekesha Almgren E, RN

## 2016-08-08 NOTE — Progress Notes (Signed)
POSTPARTUM PROGRESS NOTE  Post Partum Day 1  Subjective:  Beth Carr is a 21 y.o. G1P1001  s/p SVD at 6167w1d, s/p IOL for pre-eclampsia.  No acute events overnight.  Pt denies problems with ambulating, voiding or po intake.  She denies nausea or vomiting.   Lochia Small. Also denies any headache, blurry vision, or RUQ pain.  Objective: Blood pressure 127/72, pulse (!) 51, temperature 97.6 F (36.4 C), temperature source Oral, resp. rate 18, height 5\' 3"  (1.6 m), weight 149 lb (67.6 kg), last menstrual period 10/31/2015, SpO2 99 %, unknown if currently breastfeeding.  Physical Exam:  General: alert, cooperative and no distress Chest: no respiratory distress; lungs CTAB Heart: regular rate, distal pulses intact Abdomen: soft, nontender,  Uterine Fundus: firm, appropriately tender DVT Evaluation: No calf swelling or tenderness Extremities: 1+ edema to mid-calves bilaterally.   Recent Labs  08/05/16 1126 08/06/16 1343  HGB 12.9 13.9  HCT 36.8 39.2    Assessment/Plan:  ASSESSMENT: Beth Carr is a 21 y.o. G1P1001 s/p SVD at 2667w1d, s/p IOL for pre-eclampsia. PPD#1 - doing well PreE: BP wnl last night, 150s/90s this morning, and 127/72 on recheck an hour later. She has some LE edema on exam. Will give furosemide 20 mg x 2 today, then start HCTZ 25 mg daily . Replace K with 20 mEq BID today. Contraception: Nexplanon Feeding: breast  Plan for discharge tomorrow   LOS: 3 days   Kandra NicolasJulie P Ricka Westra MD 08/08/2016, 7:26 AM

## 2016-08-09 MED ORDER — IBUPROFEN 600 MG PO TABS: 600.0000 mg | ORAL_TABLET | Freq: Four times a day (QID) | ORAL | 0 refills | Status: DC

## 2016-08-09 MED ORDER — HYDROCHLOROTHIAZIDE 25 MG PO TABS: 25.0000 mg | ORAL_TABLET | Freq: Every day | ORAL | 0 refills | Status: DC

## 2016-08-09 NOTE — Discharge Summary (Signed)
OB Discharge Summary     Patient Name: Beth DrainChassie A Nez DOB: 03/03/1995 MRN: [id number]  Date of admission: 08/05/2016 Delivering MD: Frederik PearEGELE, JULIE P   Date of discharge: 08/09/2016  Admitting diagnosis: 39WKS, PRE ECLAM EVAL Intrauterine pregnancy: 7983w1d     Secondary diagnosis:  Active Problems:   Preeclampsia, third trimester  Additional problems:  Patient Active Problem List   Diagnosis Date Noted  . Preeclampsia, third trimester 08/05/2016  . Supervision of normal pregnancy 01/27/2016  . Genital herpes affecting pregnancy 01/27/2016       Discharge diagnosis: Term Pregnancy Delivered and Preeclampsia (mild)                                                                                                Post partum procedures:none  Augmentation: AROM, Pitocin, Cytotec and Foley Balloon  Complications: None  Hospital course:  Induction of Labor With Vaginal Delivery   21 y.o. yo G1P1001 at 4383w1d was admitted to the hospital 08/05/2016 for induction of labor.  Indication for induction: Preeclampsia. Plts were slightly decreased, but otherwise pre-e labs were nl.  Patient had an uncomplicated labor course as follows: Membrane Rupture Time/Date: 2:44 PM ,08/06/2016   Intrapartum Procedures: Episiotomy: None [1]                                         Lacerations:  Labial [10]  Patient had delivery of a Viable infant.  Information for the patient's newborn:  Georgia Lopesurner, Boy Sharesa [[id number]][[id number]]  Delivery Method: Vaginal, Spontaneous Delivery (Filed from Delivery Summary)   08/07/2016  Details of delivery can be found in separate delivery note.  Patient had a routine postpartum course. Patient is discharged home 08/09/16. She was started on HCTZ 25mg  prior to d/c.  Physical exam  Vitals:   08/08/16 0608 08/08/16 0700 08/08/16 1700 08/09/16 0530  BP: (!) 158/92 127/72 (!) 141/88 130/68  Pulse: 62 (!) 51 67 60  Resp:   20 18  Temp:   98.4 F (36.9 C) 98 F (36.7 C)  TempSrc:   Oral  Oral  SpO2:      Weight:      Height:       General: alert, cooperative and no distress Lochia: appropriate Uterine Fundus: firm Incision: N/A DVT Evaluation: Negative Homan's sign. Calf/Ankle edema is present 2+ pitting edema Labs: Lab Results  Component Value Date   WBC 11.6 (H) 08/06/2016   HGB 13.9 08/06/2016   HCT 39.2 08/06/2016   MCV 96.3 08/06/2016   PLT 123 (L) 08/06/2016   CMP Latest Ref Rng & Units 08/05/2016  Glucose 65 - 99 mg/dL 76  BUN 6 - 20 mg/dL 8  Creatinine 7.820.44 - 9.561.00 mg/dL 2.130.53  Sodium 086135 - 578145 mmol/L 134(L)  Potassium 3.5 - 5.1 mmol/L 3.9  Chloride 101 - 111 mmol/L 106  CO2 22 - 32 mmol/L 20(L)  Calcium 8.9 - 10.3 mg/dL 4.6(N8.5(L)  Total Protein 6.5 - 8.1 g/dL 6.3(L)  Total Bilirubin 0.3 - 1.2  mg/dL 0.5  Alkaline Phos 38 - 126 U/L 236(H)  AST 15 - 41 U/L 18  ALT 14 - 54 U/L 12(L)    Discharge instruction: per After Visit Summary and "Baby and Me Booklet".  After visit meds:  Allergies as of 08/09/2016   No Known Allergies     Medication List    STOP taking these medications   DICLEGIS 10-10 MG Tbec Generic drug:  Doxylamine-Pyridoxine     TAKE these medications   acetaminophen 500 MG tablet Commonly known as:  TYLENOL Take 500 mg by mouth every 6 (six) hours as needed for mild pain.   albuterol 108 (90 Base) MCG/ACT inhaler Commonly known as:  PROVENTIL HFA;VENTOLIN HFA Inhale 2 puffs into the lungs every 4 (four) hours as needed for wheezing or shortness of breath.   calcium carbonate 500 MG chewable tablet Commonly known as:  TUMS - dosed in mg elemental calcium Chew 1 tablet by mouth 2 (two) times daily as needed for indigestion or heartburn.   cyclobenzaprine 10 MG tablet Commonly known as:  FLEXERIL Take 1 tablet (10 mg total) by mouth every 8 (eight) hours as needed for muscle spasms.   hydrochlorothiazide 25 MG tablet Commonly known as:  HYDRODIURIL Take 1 tablet (25 mg total) by mouth daily.   ibuprofen 600 MG  tablet Commonly known as:  ADVIL,MOTRIN Take 1 tablet (600 mg total) by mouth every 6 (six) hours.   Prenatal Vitamins 0.8 MG tablet Take 1 tablet by mouth daily.   valACYclovir 500 MG tablet Commonly known as:  VALTREX Take 1 tablet (500 mg total) by mouth 2 (two) times daily. Until delivery of baby       Diet: routine diet  Activity: Advance as tolerated. Pelvic rest for 6 weeks.   Outpatient follow up:1 week for blood pressure check, 4-6 weeks for postpartum care Follow up Appt:No future appointments. Follow up Visit:No Follow-up on file.  Postpartum contraception: Nexplanon  Newborn Data: Live born female  Birth Weight: 7 lb 0.4 oz (3185 g) APGAR: 8, 9  Baby Feeding: Breast Disposition:home with mother   08/09/2016 [place] Shirley, DO  CNM attestation I have seen and examined this patient and agree with above documentation in the restident's note.   Sabiha A Lawther is a 21 y.o. G1P1001 s/p SVD.   Pain is well controlled.  Plan for birth control is Nexplanon.  Method of Feeding: breast  PE:  BP (!) 142/87 (BP Location: Left Arm)   Pulse 67   Temp 98 F (36.7 C) (Oral)   Resp 18   Ht 5\' 3"  (1.6 m)   Wt 67.6 kg (149 lb)   LMP 10/31/2015 (Exact Date)   SpO2 99%   Breastfeeding? Unknown   BMI 26.39 kg/m  Fundus firm  No results for input(s): HGB, HCT in the last 72 hours.   Plan: discharge today - postpartum care discussed - f/u clinic in 1 week for a BP check and 4 weeks for postpartum visit   Stevon Gough, CNM 9:00 AM

## 2016-08-09 NOTE — Discharge Instructions (Signed)

## 2016-08-13 ENCOUNTER — Inpatient Hospital Stay (HOSPITAL_COMMUNITY): Admission: RE | Admit: 2016-08-13 | Payer: Medicaid Other | Source: Ambulatory Visit

## 2016-08-13 ENCOUNTER — Encounter: Payer: Medicaid Other | Admitting: Obstetrics and Gynecology

## 2016-08-17 ENCOUNTER — Telehealth: Payer: Self-pay | Admitting: General Practice

## 2016-08-17 NOTE — Telephone Encounter (Signed)
Called and notified patient appointment regarding BP check.  Patient stated she will be here at 2:15pm.

## 2016-08-18 ENCOUNTER — Ambulatory Visit: Payer: Medicaid Other

## 2016-09-23 ENCOUNTER — Ambulatory Visit: Payer: Medicaid Other | Admitting: Advanced Practice Midwife

## 2016-10-02 ENCOUNTER — Other Ambulatory Visit: Payer: Self-pay | Admitting: Family Medicine

## 2016-10-11 ENCOUNTER — Encounter: Payer: Self-pay | Admitting: Advanced Practice Midwife

## 2016-10-11 ENCOUNTER — Ambulatory Visit (INDEPENDENT_AMBULATORY_CARE_PROVIDER_SITE_OTHER): Payer: Medicaid Other | Admitting: Advanced Practice Midwife

## 2016-10-11 DIAGNOSIS — Z1389 Encounter for screening for other disorder: Secondary | ICD-10-CM

## 2016-10-11 DIAGNOSIS — Z30017 Encounter for initial prescription of implantable subdermal contraceptive: Secondary | ICD-10-CM

## 2016-10-11 DIAGNOSIS — Z3202 Encounter for pregnancy test, result negative: Secondary | ICD-10-CM

## 2016-10-11 DIAGNOSIS — Z3049 Encounter for surveillance of other contraceptives: Secondary | ICD-10-CM | POA: Diagnosis not present

## 2016-10-11 LAB — POCT PREGNANCY, URINE: Preg Test, Ur: NEGATIVE

## 2016-10-11 MED ORDER — ETONOGESTREL 68 MG ~~LOC~~ IMPL
68.0000 mg | DRUG_IMPLANT | Freq: Once | SUBCUTANEOUS | Status: AC
  Administered 2016-10-11: 68 mg via SUBCUTANEOUS

## 2016-10-11 NOTE — Progress Notes (Signed)
Subjective:     Beth Carr is a 21 y.o. female who presents for a postpartum visit. She is 8 weeks postpartum following a spontaneous vaginal delivery. I have fully reviewed the prenatal and intrapartum course. The delivery was at 40 gestational weeks. Outcome: spontaneous vaginal delivery. Anesthesia: epidural. Postpartum course has been has seen PCP for depression and therapy. She has been started on Cymbalta. Has been on meds for about one week. Has noticed improvement. Going to counseling once per week and sees MD once per month. Baby's course has been uncomplicated. Baby is feeding by breast and bottle . Bleeding spotting off and on. . Bowel function is normal. Bladder function is normal. Patient is sexually active. She had intercourse about 2 weeks ago with withdrawal method. Contraception method is Nexplanon. Postpartum depression screening: on meds. Seeing psych.  The following portions of the patient's history were reviewed and updated as appropriate: allergies, current medications, past family history, past medical history, past social history, past surgical history and problem list.  Review of Systems Pertinent items are noted in HPI.   Objective:    There were no vitals taken for this visit.  General:  alert, cooperative and appears stated age   Breasts:  inspection negative, no nipple discharge or bleeding, no masses or nodularity palpable  Lungs: clear to auscultation bilaterally  Heart:  regular rate and rhythm, S1, S2 normal, no murmur, click, rub or gallop  Abdomen: soft, non-tender; bowel sounds normal; no masses,  no organomegaly   Vulva:  not evaluated  Vagina: not evaluated  Cervix:  NA  Corpus: not examined  Adnexa:  not evaluated  Rectal Exam: Not performed.        GYNECOLOGY OFFICE PROCEDURE NOTE  Beth Carr is a 21 y.o. G1P1001 here for Nexplanon insertion.  Last pap smear was on 01/27/16 and was normal.  No other gynecologic concerns.  Nexplanon  Insertion Procedure Patient identified, informed consent performed, consent signed.   Patient does understand that irregular bleeding is a very common side effect of this medication. She was advised to have backup contraception for one week after placement. Pregnancy test in clinic today was negative.  Appropriate time out taken.  Patient's left arm was prepped and draped in the usual sterile fashion. The ruler used to measure and mark insertion area.  Patient was prepped with alcohol swab and then injected with 3 ml of 1% lidocaine.  She was prepped with betadine, Nexplanon removed from packaging,  Device confirmed in needle, then inserted full length of needle and withdrawn per handbook instructions. Nexplanon was able to palpated in the patient's arm; patient palpated the insert herself. There was minimal blood loss.  Patient insertion site covered with guaze and a pressure bandage to reduce any bruising.  The patient tolerated the procedure well and was given post procedure instructions.    Thressa Sheller 4:15 PM 10/11/16   Assessment:    Normal postpartum exam. Pap smear not done at today's visit.    1. Postpartum care and examination   2. Encounter for initial prescription of implantable subdermal contraceptive      Plan:    1. Contraception: Nexplanon 2. Routine care 3. Follow up in: 1 year or as needed.

## 2016-10-11 NOTE — Patient Instructions (Signed)
Etonogestrel implant What is this medicine? ETONOGESTREL (et oh noe JES trel) is a contraceptive (birth control) device. It is used to prevent pregnancy. It can be used for up to 3 years. This medicine may be used for other purposes; ask your health care provider or pharmacist if you have questions. COMMON BRAND NAME(S): Implanon, Nexplanon What should I tell my health care provider before I take this medicine? They need to know if you have any of these conditions: -abnormal vaginal bleeding -blood vessel disease or blood clots -cancer of the breast, cervix, or liver -depression -diabetes -gallbladder disease -headaches -heart disease or recent heart attack -high blood pressure -high cholesterol -kidney disease -liver disease -renal disease -seizures -tobacco smoker -an unusual or allergic reaction to etonogestrel, other hormones, anesthetics or antiseptics, medicines, foods, dyes, or preservatives -pregnant or trying to get pregnant -breast-feeding How should I use this medicine? This device is inserted just under the skin on the inner side of your upper arm by a health care professional. Talk to your pediatrician regarding the use of this medicine in children. Special care may be needed. Overdosage: If you think you have taken too much of this medicine contact a poison control center or emergency room at once. NOTE: This medicine is only for you. Do not share this medicine with others. What if I miss a dose? This does not apply. What may interact with this medicine? Do not take this medicine with any of the following medications: -amprenavir -bosentan -fosamprenavir This medicine may also interact with the following medications: -barbiturate medicines for inducing sleep or treating seizures -certain medicines for fungal infections like ketoconazole and itraconazole -grapefruit juice -griseofulvin -medicines to treat seizures like carbamazepine, felbamate, oxcarbazepine,  phenytoin, topiramate -modafinil -phenylbutazone -rifampin -rufinamide -some medicines to treat HIV infection like atazanavir, indinavir, lopinavir, nelfinavir, tipranavir, ritonavir -St. John's wort This list may not describe all possible interactions. Give your health care provider a list of all the medicines, herbs, non-prescription drugs, or dietary supplements you use. Also tell them if you smoke, drink alcohol, or use illegal drugs. Some items may interact with your medicine. What should I watch for while using this medicine? This product does not protect you against HIV infection (AIDS) or other sexually transmitted diseases. You should be able to feel the implant by pressing your fingertips over the skin where it was inserted. Contact your doctor if you cannot feel the implant, and use a non-hormonal birth control method (such as condoms) until your doctor confirms that the implant is in place. If you feel that the implant may have broken or become bent while in your arm, contact your healthcare provider. What side effects may I notice from receiving this medicine? Side effects that you should report to your doctor or health care professional as soon as possible: -allergic reactions like skin rash, itching or hives, swelling of the face, lips, or tongue -breast lumps -changes in emotions or moods -depressed mood -heavy or prolonged menstrual bleeding -pain, irritation, swelling, or bruising at the insertion site -scar at site of insertion -signs of infection at the insertion site such as fever, and skin redness, pain or discharge -signs of pregnancy -signs and symptoms of a blood clot such as breathing problems; changes in vision; chest pain; severe, sudden headache; pain, swelling, warmth in the leg; trouble speaking; sudden numbness or weakness of the face, arm or leg -signs and symptoms of liver injury like dark yellow or brown urine; general ill feeling or flu-like symptoms;  light-colored   stools; loss of appetite; nausea; right upper belly pain; unusually weak or tired; yellowing of the eyes or skin -unusual vaginal bleeding, discharge -signs and symptoms of a stroke like changes in vision; confusion; trouble speaking or understanding; severe headaches; sudden numbness or weakness of the face, arm or leg; trouble walking; dizziness; loss of balance or coordination Side effects that usually do not require medical attention (report to your doctor or health care professional if they continue or are bothersome): -acne -back pain -breast pain -changes in weight -dizziness -general ill feeling or flu-like symptoms -headache -irregular menstrual bleeding -nausea -sore throat -vaginal irritation or inflammation This list may not describe all possible side effects. Call your doctor for medical advice about side effects. You may report side effects to FDA at 1-800-FDA-1088. Where should I keep my medicine? This drug is given in a hospital or clinic and will not be stored at home. NOTE: This sheet is a summary. It may not cover all possible information. If you have questions about this medicine, talk to your doctor, pharmacist, or health care provider.  2018 Elsevier/Gold Standard (2015-07-10 11:19:22)  
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
## 2017-02-07 ENCOUNTER — Telehealth: Payer: Self-pay | Admitting: *Deleted

## 2017-02-07 NOTE — Telephone Encounter (Signed)
I called Beth Carr and she states she throws up everyday like she did 6 months of her pregnancy and she bleeds lightly everyday but enough to wear a pad.  We discussed is normal to have irregular bleeding in first 3-6 months after nexplanon and sometimes it gets better; sometimes not. I requested she do a home pregnancy test to make sure she is not pregnant since nexplanon is not 100%effective. I asked her to call us with results. I offered to make an appointment for her for one month to have nexplanon removed if bleeding is not better. She agrees with plan. Registrars will call her with appt. She requests Monday or Wednesday.

## 2017-02-07 NOTE — Telephone Encounter (Signed)
Received a voice message frin 02/05/17 stating she had a baby in August then got a nexplanon and is having " so many problems- makes her feel awful and wants to get it out and try a different birth control plan. States is not 911 emergency , but is emergency.

## 2017-02-14 ENCOUNTER — Ambulatory Visit: Payer: Medicaid Other | Admitting: Family Medicine

## 2017-04-13 ENCOUNTER — Telehealth: Payer: Self-pay | Admitting: General Practice

## 2017-04-13 NOTE — Telephone Encounter (Signed)
Patient called and left message on nurse line stating she has been waiting for an appt over a month to have this birth control taken out of her arm. Patient states she has been bleeding for over a month. Per chart review, patient was scheduled an appt and notified via mychart- but patient didn't see mychart message and thus no showed for appt. Called & discussed with patient and she states she wasn't aware of that appt. Patient states she is moving next month & wants to make sure this thing is out of her arm by then. Recommended she come in to walk in hours on Monday. Patient verbalized understanding & had no questions

## 2017-05-10 ENCOUNTER — Encounter: Payer: Self-pay | Admitting: *Deleted

## 2017-07-25 ENCOUNTER — Ambulatory Visit (INDEPENDENT_AMBULATORY_CARE_PROVIDER_SITE_OTHER): Payer: Medicaid Other | Admitting: Advanced Practice Midwife

## 2017-07-25 ENCOUNTER — Encounter: Payer: Self-pay | Admitting: Advanced Practice Midwife

## 2017-07-25 VITALS — BP 121/85 | HR 75 | Wt 121.3 lb

## 2017-07-25 DIAGNOSIS — Z3046 Encounter for surveillance of implantable subdermal contraceptive: Secondary | ICD-10-CM

## 2017-07-25 NOTE — Progress Notes (Signed)
GYNECOLOGY OFFICE PROCEDURE NOTE  Takeisha A Mayford Knifeurner is a 22 y.o. G1P1001 here for Nexplanon removal.  Last pap smear was on 01/2016 and was normal.  Patient reports mood changes and bleeding. States that she is not sexually active at this time and wants to have Nexplanon removed. Offered OCPs or trial with Ibuprofen to see if bleeding improves. She declines at this time, and wants to proceed with removal.    Nexplanon Removal Patient identified, informed consent performed, consent signed.   Appropriate time out taken. Nexplanon site identified.  Area prepped in usual sterile fashon. One ml of 1% lidocaine was used to anesthetize the area at the distal end of the implant. A small stab incision was made right beside the implant on the distal portion.  The Nexplanon rod was grasped using hemostats and removed without difficulty.  There was minimal blood loss. There were no complications.  3 ml of 1% lidocaine was injected around the incision for post-procedure analgesia.  Steri-strips were applied over the small incision.  A pressure bandage was applied to reduce any bruising.  The patient tolerated the procedure well and was given post procedure instructions.        Thressa ShellerHeather Chaslyn Eisen 8:02 PM 07/25/17

## 2017-07-25 NOTE — Progress Notes (Signed)
Having spotting everyday. Pink brownish color. NEver had this until got Nexplanon. Want it removed and do not want birth control now as I am not with anyone. Want to think of options first

## 2017-08-12 ENCOUNTER — Encounter: Payer: Self-pay | Admitting: *Deleted

## 2017-08-12 ENCOUNTER — Ambulatory Visit: Payer: Medicaid Other | Admitting: Family Medicine

## 2017-08-12 NOTE — Progress Notes (Signed)
Beth Carr appt for nexplanon removal but per chart review already had nexplanon removed in evening clinic. No need to call pateint.

## 2018-09-03 ENCOUNTER — Other Ambulatory Visit: Payer: Self-pay

## 2018-09-03 ENCOUNTER — Encounter (HOSPITAL_COMMUNITY): Payer: Self-pay

## 2018-09-03 ENCOUNTER — Telehealth (HOSPITAL_COMMUNITY): Payer: Self-pay

## 2018-09-03 ENCOUNTER — Ambulatory Visit (HOSPITAL_COMMUNITY)
Admission: EM | Admit: 2018-09-03 | Discharge: 2018-09-03 | Disposition: A | Discharge status: Home / Self Care | Payer: Medicaid Other | Attending: Family Medicine | Admitting: Family Medicine

## 2018-09-03 DIAGNOSIS — O98319 Other infections with a predominantly sexual mode of transmission complicating pregnancy, unspecified trimester: Secondary | ICD-10-CM

## 2018-09-03 DIAGNOSIS — L0100 Impetigo, unspecified: Secondary | ICD-10-CM | POA: Diagnosis not present

## 2018-09-03 DIAGNOSIS — A6009 Herpesviral infection of other urogenital tract: Secondary | ICD-10-CM

## 2018-09-03 DIAGNOSIS — A6000 Herpesviral infection of urogenital system, unspecified: Secondary | ICD-10-CM

## 2018-09-03 MED ORDER — VALACYCLOVIR HCL 1 G PO TABS: ORAL_TABLET | ORAL | 0 refills | Status: DC

## 2018-09-03 MED ORDER — SULFAMETHOXAZOLE-TRIMETHOPRIM 800-160 MG PO TABS: 1.0000 | ORAL_TABLET | Freq: Two times a day (BID) | ORAL | 0 refills | Status: DC

## 2018-09-03 MED ORDER — SULFAMETHOXAZOLE-TRIMETHOPRIM 800-160 MG PO TABS: 1.0000 | ORAL_TABLET | Freq: Two times a day (BID) | ORAL | 0 refills | Status: AC

## 2018-09-03 NOTE — ED Triage Notes (Signed)
Pt presents with blisters on different areas of body.

## 2018-09-03 NOTE — ED Provider Notes (Signed)
[place]    CSN: [id number] Arrival date & time: 09/03/18  1326      History   Chief Complaint Chief Complaint  Patient presents with  . Blister  . Medication Refill    Herpes medication    HPI Beth Carr is a 23 y.o. female.   HPI  Son has impetigo.  He was exposed to a cousin who has MRSA.  Now she has a couple of areas of rash that are painful.  She would like to have this treated. She does not currently have a PCP.  She has recurring genital herpes.  Would like to know if she can have a refill of her Valtrex.  Past Medical History:  Diagnosis Date  . Asthma   . Dysmenorrhea    Tired OCPs in past X2 but reports worsening of dysmenorrhea and worsening of clots  . Genital herpes     Patient Active Problem List   Diagnosis Date Noted  . Preeclampsia, third trimester 08/05/2016  . Supervision of normal pregnancy 01/27/2016  . Genital herpes affecting pregnancy 01/27/2016    Past Surgical History:  Procedure Laterality Date  . TOOTH EXTRACTION      OB History    Gravida  1   Para  1   Term  1   Preterm  0   AB  0   Living  1     SAB  0   TAB  0   Ectopic  0   Multiple  0   Live Births  1            Home Medications    Prior to Admission medications   Medication Sig Start Date End Date Taking? Authorizing Provider  acetaminophen (TYLENOL) 500 MG tablet Take 500 mg by mouth every 6 (six) hours as needed for mild pain.    [provider]  albuterol (PROVENTIL HFA;VENTOLIN HFA) 108 (90 BASE) MCG/ACT inhaler Inhale 2 puffs into the lungs every 4 (four) hours as needed for wheezing or shortness of breath. 12/19/14   Little, Wenda Overland, MD  calcium carbonate (TUMS - DOSED IN MG ELEMENTAL CALCIUM) 500 MG chewable tablet Chew 1 tablet by mouth 2 (two) times daily as needed for indigestion or heartburn.    [provider]  sulfamethoxazole-trimethoprim (BACTRIM DS) 800-160 MG tablet Take 1 tablet by mouth 2  (two) times daily for 7 days. 09/03/18 09/10/18  Raylene Everts, MD  valACYclovir (VALTREX) 1000 MG tablet 1 pill twice a day at first sign of rash 09/03/18   Raylene Everts, MD  hydrochlorothiazide (HYDRODIURIL) 25 MG tablet Take 1 tablet (25 mg total) by mouth daily. 08/09/16 09/03/18  Shirley, [place], DO    Family History Family History  Problem Relation Age of Onset  . Hypertension Mother        gestational  . Diabetes Maternal Grandfather     Social History Social History   Tobacco Use  . Smoking status: Never Smoker  . Smokeless tobacco: Never Used  Substance Use Topics  . Alcohol use: No  . Drug use: No     Allergies   Patient has no known allergies.   Review of Systems Review of Systems  Constitutional: Negative for chills and fever.  HENT: Negative for ear pain and sore throat.   Eyes: Negative for pain and visual disturbance.  Respiratory: Negative for cough and shortness of breath.   Cardiovascular: Negative for chest pain and palpitations.  Gastrointestinal: Negative  for abdominal pain and vomiting.  Genitourinary: Negative for dysuria and hematuria.  Musculoskeletal: Negative for arthralgias and back pain.  Skin: Positive for rash. Negative for color change.  Neurological: Negative for seizures and syncope.  All other systems reviewed and are negative.    Physical Exam Triage Vital Signs ED Triage Vitals  Enc Vitals Group     BP 09/03/18 1410 114/70     Pulse Rate 09/03/18 1410 70     Resp 09/03/18 1410 18     Temp 09/03/18 1410 98.5 F (36.9 C)     Temp Source 09/03/18 1410 Oral     SpO2 09/03/18 1410 93 %     Weight --      Height --      Head Circumference --      Peak Flow --      Pain Score 09/03/18 1411 7     Pain Loc --      Pain Edu? --      Excl. in GC? --    No data found.  Updated Vital Signs BP 114/70 (BP Location: Left Arm)   Pulse 70   Temp 98.5 F (36.9 C) (Oral)   Resp 18   SpO2 93%   Visual Acuity Right Eye  Distance:   Left Eye Distance:   Bilateral Distance:    Right Eye Near:   Left Eye Near:    Bilateral Near:     Physical Exam Constitutional:      General: She is not in acute distress.    Appearance: She is well-developed.  HENT:     Head: Normocephalic and atraumatic.  Eyes:     Conjunctiva/sclera: Conjunctivae normal.     Pupils: Pupils are equal, round, and reactive to light.  Neck:     Musculoskeletal: Normal range of motion.  Cardiovascular:     Rate and Rhythm: Normal rate.  Pulmonary:     Effort: Pulmonary effort is normal. No respiratory distress.  Abdominal:     General: There is no distension.     Palpations: Abdomen is soft.  Musculoskeletal: Normal range of motion.  Skin:    General: Skin is warm and dry.     Comments: Patient has 2 patches on her left wrist that are 15 mm across erythematous macules with some yellow crusting  Neurological:     Mental Status: She is alert.  Psychiatric:        Mood and Affect: Mood normal.        Behavior: Behavior normal.      UC Treatments / Results  Labs (all labs ordered are listed, but only abnormal results are displayed) Labs Reviewed - No data to display  EKG   Radiology No results found.  Procedures Procedures (including critical care time)  Medications Ordered in UC Medications - No data to display  Initial Impression / Assessment and Plan / UC Course  I have reviewed the triage vital signs and the nursing notes.  Pertinent labs & imaging results that were available during my care of the patient were reviewed by me and considered in my medical decision making (see chart for details).    We talked about the contagiousness of impetigo.  Treatment.  Expectations.  Final Clinical Impressions(s) / UC Diagnoses   Final diagnoses:  Impetigo  Genital herpes simplex, unspecified site     Discharge Instructions     Take the antibiotic 2 times a day as prescribed May use mupirocin ointment on your  rash Take Valtrex as needed   ED Prescriptions    Medication Sig Dispense Auth. Provider   valACYclovir (VALTREX) 1000 MG tablet 1 pill twice a day at first sign of rash 10 tablet Eustace MooreNelson, Kortez Murtagh Sue, MD   sulfamethoxazole-trimethoprim (BACTRIM DS) 800-160 MG tablet Take 1 tablet by mouth 2 (two) times daily for 7 days. 14 tablet Eustace MooreNelson, Demecia Northway Sue, MD     Controlled Substance Prescriptions [place] Controlled Substance Registry consulted? Not Applicable   Eustace MooreNelson, Skyra Crichlow Sue, MD 09/03/18 1534

## 2018-09-03 NOTE — ED Triage Notes (Signed)
Pt also wishes to get refill on acyclovir.

## 2018-09-03 NOTE — Discharge Instructions (Addendum)
Take the antibiotic 2 times a day as prescribed May use mupirocin ointment on your rash Take Valtrex as needed

## 2018-12-17 ENCOUNTER — Other Ambulatory Visit: Payer: Self-pay

## 2018-12-17 ENCOUNTER — Encounter (HOSPITAL_COMMUNITY): Payer: Self-pay

## 2018-12-17 ENCOUNTER — Emergency Department (HOSPITAL_COMMUNITY)
Admission: EM | Admit: 2018-12-17 | Discharge: 2018-12-17 | Disposition: A | Discharge status: Home / Self Care | Payer: Medicaid Other | Attending: Emergency Medicine | Admitting: Emergency Medicine

## 2018-12-17 DIAGNOSIS — T50901A Poisoning by unspecified drugs, medicaments and biological substances, accidental (unintentional), initial encounter: Secondary | ICD-10-CM | POA: Diagnosis not present

## 2018-12-17 DIAGNOSIS — F1910 Other psychoactive substance abuse, uncomplicated: Secondary | ICD-10-CM | POA: Diagnosis not present

## 2018-12-17 DIAGNOSIS — J45909 Unspecified asthma, uncomplicated: Secondary | ICD-10-CM | POA: Insufficient documentation

## 2018-12-17 LAB — I-STAT BETA HCG BLOOD, ED (MC, WL, AP ONLY): I-stat hCG, quantitative: <5 m[IU]/mL (ref <5)

## 2018-12-17 LAB — CBC WITH DIFFERENTIAL/PLATELET
Abs Immature Granulocytes: 0.07 10*3/uL (ref 0.00–0.07)
Basophils Absolute: 0.1 10*3/uL (ref 0.0–0.1)
Basophils Relative: 0 %
Eosinophils Absolute: 0.4 10*3/uL (ref 0.0–0.5)
Eosinophils Relative: 3 %
HCT: 42.9 % (ref 36.0–46.0)
Hemoglobin: 13.8 g/dL (ref 12.0–15.0)
Immature Granulocytes: 0 %
Lymphocytes Relative: 38 %
Lymphs Abs: 5.9 10*3/uL — ABNORMAL HIGH (ref 0.7–4.0)
MCH: 33.7 pg (ref 26.0–34.0)
MCHC: 32.2 g/dL (ref 30.0–36.0)
MCV: 104.9 fL — ABNORMAL HIGH (ref 80.0–100.0)
Monocytes Absolute: 1.1 10*3/uL — ABNORMAL HIGH (ref 0.1–1.0)
Monocytes Relative: 7 %
Neutro Abs: 8.2 10*3/uL — ABNORMAL HIGH (ref 1.7–7.7)
Neutrophils Relative %: 52 %
Platelets: 258 10*3/uL (ref 150–400)
RBC: 4.09 MIL/uL (ref 3.87–5.11)
RDW: 11.9 % (ref 11.5–15.5)
WBC: 15.8 10*3/uL — ABNORMAL HIGH (ref 4.0–10.5)
nRBC: 0 % (ref 0.0–0.2)

## 2018-12-17 LAB — COMPREHENSIVE METABOLIC PANEL
ALT: 30 U/L (ref 0–44)
AST: 28 U/L (ref 15–41)
Albumin: 4 g/dL (ref 3.5–5.0)
Alkaline Phosphatase: 45 U/L (ref 38–126)
Anion gap: 11 (ref 5–15)
BUN: 12 mg/dL (ref 6–20)
CO2: 23 mmol/L (ref 22–32)
Calcium: 8.5 mg/dL — ABNORMAL LOW (ref 8.9–10.3)
Chloride: 105 mmol/L (ref 98–111)
Creatinine, Ser: 1.31 mg/dL — ABNORMAL HIGH (ref 0.44–1.00)
GFR calc Af Amer: >60 mL/min (ref >60)
GFR calc non Af Amer: 57 mL/min — ABNORMAL LOW (ref >60)
Glucose, Bld: 202 mg/dL — ABNORMAL HIGH (ref 70–99)
Potassium: 3.5 mmol/L (ref 3.5–5.1)
Sodium: 139 mmol/L (ref 135–145)
Total Bilirubin: 0.5 mg/dL (ref 0.3–1.2)
Total Protein: 6.4 g/dL — ABNORMAL LOW (ref 6.5–8.1)

## 2018-12-17 LAB — RAPID URINE DRUG SCREEN, HOSP PERFORMED
Amphetamines: NOT DETECTED
Barbiturates: NOT DETECTED
Benzodiazepines: POSITIVE — AB
Cocaine: POSITIVE — AB
Opiates: NOT DETECTED
Tetrahydrocannabinol: POSITIVE — AB

## 2018-12-17 LAB — CBG MONITORING, ED: Glucose-Capillary: 155 mg/dL — ABNORMAL HIGH (ref 70–99)

## 2018-12-17 MED ORDER — ONDANSETRON HCL 4 MG/2ML IJ SOLN
4.0000 mg | Freq: Once | INTRAMUSCULAR | Status: AC
  Administered 2018-12-17: 4 mg via INTRAVENOUS
  Filled 2018-12-17: qty 2

## 2018-12-17 MED ORDER — SODIUM CHLORIDE 0.9 % IV SOLN
INTRAVENOUS | Status: DC
  Administered 2018-12-17: 02:00:00 via INTRAVENOUS

## 2018-12-17 MED ORDER — NALOXONE HCL 0.4 MG/ML IJ SOLN: 0.4000 mg | INTRAMUSCULAR | Status: DC | PRN

## 2018-12-17 MED ORDER — SODIUM CHLORIDE 0.9 % IV BOLUS
1000.0000 mL | Freq: Once | INTRAVENOUS | Status: AC
  Administered 2018-12-17: 1000 mL via INTRAVENOUS

## 2018-12-17 NOTE — ED Triage Notes (Signed)
Pt arrives via [facility] c/o overdose. Pt advises she used marijuana that may have been laced. On scene, pt was apneic. GCS 3. EMS administered naloxone 1mg  (IN), pt became alert and oriented (x4) with spontaneous respirations. VS: BP 128/80, HR 52, SPO2 98% RA, RR 15. At this time patient is lethargic, nauseated, and c/o mild abdominal pain.

## 2018-12-17 NOTE — ED Notes (Signed)
Patient verbalizes understanding of discharge instructions. Opportunity for questioning and answers were provided. Armband removed by staff, pt discharged from ED.  

## 2018-12-17 NOTE — ED Provider Notes (Signed)
[facility] EMERGENCY DEPARTMENT Provider Note  CSN: [id number] Arrival date & time: 12/17/18 0023  Chief Complaint(s) Drug Overdose  HPI Beth Carr is a 23 y.o. female history of marijuana use and chronic Suboxone use presents to the emergency department with likely opioid overdose.  Patient denied using any additional opioids but they report smoking a blunt earlier this evening.  She reports that she felt flushed after smoking the blunt.  After going home and while taking a shower, she felt lightheaded and does not remember anything after that.  EMS was called by the boyfriend.  EMS reported that the patient was apneic upon their arrival.  She was bagged and given intranasal Narcan to which she responded.  Additionally patient reported having a positive pregnancy test a month ago but reports she began having vaginal bleeding today.  She believes she is having a miscarriage.  Currently she is endorsing nausea and lower abdominal/pelvic discomfort  HPI  Past Medical History Past Medical History:  Diagnosis Date  . Asthma   . Dysmenorrhea    Tired OCPs in past X2 but reports worsening of dysmenorrhea and worsening of clots  . Genital herpes    Patient Active Problem List   Diagnosis Date Noted  . Preeclampsia, third trimester 08/05/2016  . Supervision of normal pregnancy 01/27/2016  . Genital herpes affecting pregnancy 01/27/2016   Home Medication(s) Prior to Admission medications   Medication Sig Start Date End Date Taking? Authorizing Provider  acetaminophen (TYLENOL) 500 MG tablet Take 500 mg by mouth every 6 (six) hours as needed for mild pain.    [provider]  albuterol (PROVENTIL HFA;VENTOLIN HFA) 108 (90 BASE) MCG/ACT inhaler Inhale 2 puffs into the lungs every 4 (four) hours as needed for wheezing or shortness of breath. 12/19/14   Little, Ambrose Finland, MD  calcium carbonate (TUMS - DOSED IN MG ELEMENTAL CALCIUM) 500 MG chewable tablet  Chew 1 tablet by mouth 2 (two) times daily as needed for indigestion or heartburn.    [provider]  valACYclovir (VALTREX) 1000 MG tablet 1 pill twice a day at first sign of rash 09/03/18   Eustace Moore, MD  hydrochlorothiazide (HYDRODIURIL) 25 MG tablet Take 1 tablet (25 mg total) by mouth daily. 08/09/16 09/03/18  Shirley, [place], DO                                                                                                                                    Past Surgical History Past Surgical History:  Procedure Laterality Date  . TOOTH EXTRACTION     Family History Family History  Problem Relation Age of Onset  . Hypertension Mother        gestational  . Diabetes Maternal Grandfather     Social History Social History   Tobacco Use  . Smoking status: Never Smoker  . Smokeless tobacco: Never Used  Substance Use Topics  .  Alcohol use: No  . Drug use: No   Allergies Patient has no known allergies.  Review of Systems Review of Systems All other systems are reviewed and are negative for acute change except as noted in the HPI  Physical Exam Vital Signs  I have reviewed the triage vital signs BP 132/81 (BP Location: Right Arm)   Pulse (!) 53   Resp 15   Ht 5\' 3"  (1.6 m)   Wt 56.7 kg   SpO2 100%   BMI 22.14 kg/m   Physical Exam Vitals reviewed.  Constitutional:      General: She is not in acute distress.    Appearance: She is well-developed. She is not diaphoretic.  HENT:     Head: Normocephalic and atraumatic.     Nose: Nose normal.  Eyes:     General: No scleral icterus.       Right eye: No discharge.        Left eye: No discharge.     Conjunctiva/sclera: Conjunctivae normal.     Pupils: Pupils are equal, round, and reactive to light.     Comments: Pin point pupils  Cardiovascular:     Rate and Rhythm: Regular rhythm. Bradycardia present.     Heart sounds: No murmur. No friction rub. No gallop.   Pulmonary:     Effort: Pulmonary effort  is normal. No respiratory distress.     Breath sounds: Normal breath sounds. No stridor. No rales.  Abdominal:     General: There is no distension.     Palpations: Abdomen is soft.     Tenderness: There is abdominal tenderness (mild) in the suprapubic area. There is no guarding or rebound.  Musculoskeletal:        General: No tenderness.     Cervical back: Normal range of motion and neck supple.  Skin:    General: Skin is warm and dry.     Findings: No erythema or rash.  Neurological:     Mental Status: She is oriented to person, place, and time. She is lethargic.     ED Results and Treatments Labs (all labs ordered are listed, but only abnormal results are displayed) Labs Reviewed  COMPREHENSIVE METABOLIC PANEL - Abnormal; Notable for the following components:      Result Value   Glucose, Bld 202 (*)    Creatinine, Ser 1.31 (*)    Calcium 8.5 (*)    Total Protein 6.4 (*)    GFR calc non Af Amer 57 (*)    All other components within normal limits  RAPID URINE DRUG SCREEN, HOSP PERFORMED - Abnormal; Notable for the following components:   Cocaine POSITIVE (*)    Benzodiazepines POSITIVE (*)    Tetrahydrocannabinol POSITIVE (*)    All other components within normal limits  CBC WITH DIFFERENTIAL/PLATELET - Abnormal; Notable for the following components:   WBC 15.8 (*)    MCV 104.9 (*)    Neutro Abs 8.2 (*)    Lymphs Abs 5.9 (*)    Monocytes Absolute 1.1 (*)    All other components within normal limits  CBG MONITORING, ED - Abnormal; Notable for the following components:   Glucose-Capillary 155 (*)    All other components within normal limits  SALICYLATE LEVEL  ACETAMINOPHEN LEVEL  ETHANOL  I-STAT BETA HCG BLOOD, ED ([facility], [facility], [facility] ONLY)  CBG MONITORING, ED  EKG  EKG Interpretation  Date/Time:  Sunday December 17 2018 00:30:20 EST Ventricular Rate:   45 PR Interval:    QRS Duration: 89 QT Interval:  507 QTC Calculation: 439 R Axis:   63 Text Interpretation: Sinus bradycardia NO STEMI. No old tracing to compare Confirmed by Addison Lank [phone]) on 12/17/2018 1:22:55 AM      Radiology No results found.  Pertinent labs & imaging results that were available during my care of the patient were reviewed by me and considered in my medical decision making (see chart for details).  Medications Ordered in ED Medications  sodium chloride 0.9 % bolus 1,000 mL (0 mLs Intravenous Stopped 12/17/18 0302)    And  0.9 %  sodium chloride infusion ( Intravenous Stopped 12/17/18 0530)  naloxone (NARCAN) injection 0.4 mg (has no administration in time range)  ondansetron (ZOFRAN) injection 4 mg (4 mg Intravenous Given 12/17/18 0115)                                                                                                                                    Procedures Procedures  (including critical care time)  Medical Decision Making / ED Course I have reviewed the nursing notes for this encounter and the patient's prior records (if available in EHR or on provided paperwork).   Ericah A Mcfall was evaluated in Emergency Department on 12/17/2018 for the symptoms described in the history of present illness. She was evaluated in the context of the global COVID-19 pandemic, which necessitated consideration that the patient might be at risk for infection with the SARS-CoV-2 virus that causes COVID-19. Institutional protocols and algorithms that pertain to the evaluation of patients at risk for COVID-19 are in a state of rapid change based on information released by regulatory bodies including the CDC and federal and state organizations. These policies and algorithms were followed during the patient's care in the ED.  Unintentional overdose.  Likely opiate related given response to Narcan.  She is hemodynamically stable.  Does not require  additional Narcan doses.  Screening labs reassuring.  UDS positive for cocaine, benzos and THC.  Beta-hCG negative.    After IV hydration and several hours of monitoring, patient significantly improved.  The patient appears reasonably screened and/or stabilized for discharge and I doubt any other medical condition or other [facility] requiring further screening, evaluation, or treatment in the ED at this time prior to discharge.  The patient is safe for discharge with strict return precautions.       Final Clinical Impression(s) / ED Diagnoses Final diagnoses:  Polysubstance abuse ([place])  Accidental drug overdose, initial encounter     The patient appears reasonably screened and/or stabilized for discharge and I doubt any other medical condition or other [facility] requiring further screening, evaluation, or treatment in the ED at this time prior to discharge.  Disposition: Discharge  Condition: Good  I have discussed the results, Dx  and Tx plan with the patient who expressed understanding and agree(s) with the plan. Discharge instructions discussed at great length. The patient was given strict return precautions who verbalized understanding of the instructions. No further questions at time of discharge.    ED Discharge Orders    None       Follow Up: Primary care provider  Schedule an appointment as soon as possible for a visit  If you do not have a primary care physician, contact [facility] at [phone] for referral     This chart was dictated using voice recognition software.  Despite best efforts to proofread,  errors can occur which can change the documentation meaning.   Nira Connardama, Wash Nienhaus Eduardo, MD 12/17/18 [phone]

## 2019-02-01 ENCOUNTER — Other Ambulatory Visit: Payer: Self-pay

## 2019-02-01 ENCOUNTER — Encounter (HOSPITAL_COMMUNITY): Payer: Self-pay | Admitting: Emergency Medicine

## 2019-02-01 ENCOUNTER — Emergency Department (HOSPITAL_COMMUNITY)
Admission: EM | Admit: 2019-02-01 | Discharge: 2019-02-01 | Disposition: A | Discharge status: Home / Self Care | Payer: Medicaid Other | Attending: Emergency Medicine | Admitting: Emergency Medicine

## 2019-02-01 DIAGNOSIS — Z3490 Encounter for supervision of normal pregnancy, unspecified, unspecified trimester: Secondary | ICD-10-CM | POA: Diagnosis not present

## 2019-02-01 DIAGNOSIS — Z5321 Procedure and treatment not carried out due to patient leaving prior to being seen by health care provider: Secondary | ICD-10-CM | POA: Diagnosis not present

## 2019-02-01 DIAGNOSIS — R109 Unspecified abdominal pain: Secondary | ICD-10-CM | POA: Insufficient documentation

## 2019-02-01 DIAGNOSIS — R197 Diarrhea, unspecified: Secondary | ICD-10-CM | POA: Insufficient documentation

## 2019-02-01 DIAGNOSIS — O99619 Diseases of the digestive system complicating pregnancy, unspecified trimester: Secondary | ICD-10-CM | POA: Insufficient documentation

## 2019-02-01 DIAGNOSIS — O219 Vomiting of pregnancy, unspecified: Secondary | ICD-10-CM | POA: Diagnosis not present

## 2019-02-01 DIAGNOSIS — O26899 Other specified pregnancy related conditions, unspecified trimester: Secondary | ICD-10-CM | POA: Diagnosis not present

## 2019-02-01 LAB — URINALYSIS, ROUTINE W REFLEX MICROSCOPIC
Bacteria, UA: NONE SEEN
Bilirubin Urine: NEGATIVE
Glucose, UA: NEGATIVE mg/dL
Hgb urine dipstick: NEGATIVE
Ketones, ur: 80 mg/dL — AB
Nitrite: NEGATIVE
Protein, ur: 30 mg/dL — AB
Specific Gravity, Urine: 1.032 — ABNORMAL HIGH (ref 1.005–1.030)
pH: 6 (ref 5.0–8.0)

## 2019-02-01 LAB — COMPREHENSIVE METABOLIC PANEL
ALT: 15 U/L (ref 0–44)
AST: 17 U/L (ref 15–41)
Albumin: 4.2 g/dL (ref 3.5–5.0)
Alkaline Phosphatase: 47 U/L (ref 38–126)
Anion gap: 12 (ref 5–15)
BUN: 8 mg/dL (ref 6–20)
CO2: 19 mmol/L — ABNORMAL LOW (ref 22–32)
Calcium: 9.7 mg/dL (ref 8.9–10.3)
Chloride: 102 mmol/L (ref 98–111)
Creatinine, Ser: 0.75 mg/dL (ref 0.44–1.00)
GFR calc Af Amer: >60 mL/min (ref >60)
GFR calc non Af Amer: >60 mL/min (ref >60)
Glucose, Bld: 98 mg/dL (ref 70–99)
Potassium: 3.7 mmol/L (ref 3.5–5.1)
Sodium: 133 mmol/L — ABNORMAL LOW (ref 135–145)
Total Bilirubin: 1.2 mg/dL (ref 0.3–1.2)
Total Protein: 7 g/dL (ref 6.5–8.1)

## 2019-02-01 LAB — CBC
HCT: 44.8 % (ref 36.0–46.0)
Hemoglobin: 15.9 g/dL — ABNORMAL HIGH (ref 12.0–15.0)
MCH: 34.2 pg — ABNORMAL HIGH (ref 26.0–34.0)
MCHC: 35.5 g/dL (ref 30.0–36.0)
MCV: 96.3 fL (ref 80.0–100.0)
Platelets: 200 10*3/uL (ref 150–400)
RBC: 4.65 MIL/uL (ref 3.87–5.11)
RDW: 11.2 % — ABNORMAL LOW (ref 11.5–15.5)
WBC: 9.8 10*3/uL (ref 4.0–10.5)
nRBC: 0 % (ref 0.0–0.2)

## 2019-02-01 LAB — I-STAT BETA HCG BLOOD, ED (MC, WL, AP ONLY): I-stat hCG, quantitative: >2000 m[IU]/mL — ABNORMAL HIGH (ref <5)

## 2019-02-01 LAB — HCG, QUANTITATIVE, PREGNANCY: hCG, Beta Chain, Quant, S: 127730 m[IU]/mL — ABNORMAL HIGH (ref <5)

## 2019-02-01 LAB — LIPASE, BLOOD: Lipase: 37 U/L (ref 11–51)

## 2019-02-01 MED ORDER — SODIUM CHLORIDE 0.9% FLUSH: 3.0000 mL | Freq: Once | INTRAVENOUS | Status: DC

## 2019-02-01 NOTE — ED Triage Notes (Signed)
Patient reports mid abdominal pain with emesis and diarrhea , LMP 12/16/18 , positive home preg test , denies fever or chills .

## 2019-02-01 NOTE — ED Notes (Signed)
Pt seen leaving the lobby and heading to the parking lot

## 2019-02-02 ENCOUNTER — Other Ambulatory Visit: Payer: Self-pay

## 2019-02-02 ENCOUNTER — Inpatient Hospital Stay (HOSPITAL_COMMUNITY)
Admission: EM | Admit: 2019-02-02 | Discharge: 2019-02-02 | Disposition: A | Discharge status: Home / Self Care | Payer: Medicaid Other | Attending: Emergency Medicine | Admitting: Emergency Medicine

## 2019-02-02 ENCOUNTER — Encounter (HOSPITAL_COMMUNITY): Payer: Self-pay

## 2019-02-02 ENCOUNTER — Other Ambulatory Visit: Payer: Self-pay | Admitting: Obstetrics and Gynecology

## 2019-02-02 DIAGNOSIS — O219 Vomiting of pregnancy, unspecified: Secondary | ICD-10-CM | POA: Diagnosis present

## 2019-02-02 DIAGNOSIS — O210 Mild hyperemesis gravidarum: Secondary | ICD-10-CM | POA: Diagnosis present

## 2019-02-02 DIAGNOSIS — O26891 Other specified pregnancy related conditions, first trimester: Secondary | ICD-10-CM

## 2019-02-02 DIAGNOSIS — J45909 Unspecified asthma, uncomplicated: Secondary | ICD-10-CM | POA: Insufficient documentation

## 2019-02-02 DIAGNOSIS — Z3A08 8 weeks gestation of pregnancy: Secondary | ICD-10-CM | POA: Diagnosis not present

## 2019-02-02 DIAGNOSIS — O99611 Diseases of the digestive system complicating pregnancy, first trimester: Secondary | ICD-10-CM | POA: Diagnosis not present

## 2019-02-02 DIAGNOSIS — O26899 Other specified pregnancy related conditions, unspecified trimester: Secondary | ICD-10-CM | POA: Diagnosis present

## 2019-02-02 DIAGNOSIS — R1013 Epigastric pain: Secondary | ICD-10-CM | POA: Diagnosis not present

## 2019-02-02 DIAGNOSIS — R12 Heartburn: Secondary | ICD-10-CM | POA: Diagnosis present

## 2019-02-02 LAB — RAPID URINE DRUG SCREEN, HOSP PERFORMED
Amphetamines: NOT DETECTED
Barbiturates: NOT DETECTED
Benzodiazepines: NOT DETECTED
Cocaine: NOT DETECTED
Opiates: POSITIVE — AB
Tetrahydrocannabinol: POSITIVE — AB

## 2019-02-02 LAB — ABO/RH: ABO/RH(D): A POS

## 2019-02-02 MED ORDER — SODIUM CHLORIDE 0.9 % IV BOLUS
1000.0000 mL | Freq: Once | INTRAVENOUS | Status: AC
  Administered 2019-02-02: 1000 mL via INTRAVENOUS

## 2019-02-02 MED ORDER — DICYCLOMINE HCL 10 MG/5ML PO SOLN
10.0000 mg | Freq: Once | ORAL | Status: AC
  Administered 2019-02-02: 10 mg via ORAL
  Filled 2019-02-02: qty 5

## 2019-02-02 MED ORDER — PROMETHAZINE HCL 12.5 MG PO TABS: 12.5000 mg | ORAL_TABLET | Freq: Four times a day (QID) | ORAL | 0 refills | Status: DC | PRN

## 2019-02-02 MED ORDER — ALUM & MAG HYDROXIDE-SIMETH 200-200-20 MG/5ML PO SUSP
30.0000 mL | Freq: Once | ORAL | Status: AC
  Administered 2019-02-02: 30 mL via ORAL
  Filled 2019-02-02: qty 30

## 2019-02-02 MED ORDER — PROMETHAZINE HCL 25 MG/ML IJ SOLN
12.5000 mg | Freq: Once | INTRAMUSCULAR | Status: AC
  Administered 2019-02-02: 12.5 mg via INTRAVENOUS
  Filled 2019-02-02: qty 1

## 2019-02-02 MED ORDER — ACETAMINOPHEN 500 MG PO TABS
1000.0000 mg | ORAL_TABLET | Freq: Once | ORAL | Status: AC
  Administered 2019-02-02: 1000 mg via ORAL
  Filled 2019-02-02: qty 2

## 2019-02-02 MED ORDER — LIDOCAINE VISCOUS HCL 2 % MT SOLN
15.0000 mL | Freq: Once | OROMUCOSAL | Status: AC
  Administered 2019-02-02: 15 mL via ORAL
  Filled 2019-02-02: qty 15

## 2019-02-02 MED ORDER — FAMOTIDINE IN NACL 20-0.9 MG/50ML-% IV SOLN
20.0000 mg | Freq: Once | INTRAVENOUS | Status: AC
  Administered 2019-02-02: 20 mg via INTRAVENOUS
  Filled 2019-02-02: qty 50

## 2019-02-02 MED ORDER — OMEPRAZOLE 20 MG PO CPDR: 20.0000 mg | DELAYED_RELEASE_CAPSULE | Freq: Every day | ORAL | 0 refills | Status: DC

## 2019-02-02 NOTE — MAU Note (Addendum)
Pt presents to MAU from ED, due to nausea/vomiting x 5 days and abdominal pain x 4 days. She has not been able to keep solids or liquids down. Reports that she has taken diclegis and has not had relief. She had +HPT and LMP-12/06/2018

## 2019-02-02 NOTE — ED Notes (Signed)
Transport called for transfer over to MAU

## 2019-02-02 NOTE — ED Triage Notes (Signed)
Pt from home with ems c.o epigastric pain for the past 4 days, as well as n/v. Pt was seen here yesterday but LWBS after triage. LMP 12/2. Chemical abortion in Oct 2020. Pt a.o, no vomiting at this time. Pt denies vaginal discharge or bleeding.

## 2019-02-02 NOTE — MAU Provider Note (Signed)
History     CSN: [id number]  Arrival date and time: 02/02/19 1017   First Provider Initiated Contact with Patient 02/02/19 1237      Chief Complaint  Patient presents with  . Abdominal Pain   HPI  Ms.  Beth Carr is a 24 y.o. year old G82P1011 female at [redacted]w[redacted]d weeks gestation who was transferred to MAU from [facility] reporting "burning" sensation in her upper abdomen, N/V, unable to eat anything, fatigue, and lightheadedness. She tried taking Pepto-Bismol, but "threw that up." She reports that her mother and her son had a GI virus last week, but they were better within 24 hours. She received Phenergan and Pepcid via IV while at [facility]. She was taking Suboxone daily, but stopped taking it 4 days ago.  Past Medical History:  Diagnosis Date  . Asthma   . Dysmenorrhea    Tired OCPs in past X2 but reports worsening of dysmenorrhea and worsening of clots  . Genital herpes     Past Surgical History:  Procedure Laterality Date  . TOOTH EXTRACTION      Family History  Problem Relation Age of Onset  . Hypertension Mother        gestational  . Diabetes Maternal Grandfather     Social History   Tobacco Use  . Smoking status: Never Smoker  . Smokeless tobacco: Never Used  Substance Use Topics  . Alcohol use: No  . Drug use: Yes    Types: Marijuana    Comment: Last use 01/2019    Allergies: No Known Allergies  Medications Prior to Admission  Medication Sig Dispense Refill Last Dose  . acetaminophen (TYLENOL) 500 MG tablet Take 500 mg by mouth every 6 (six) hours as needed for mild pain.   Past Month at Unknown time  . valACYclovir (VALTREX) 1000 MG tablet 1 pill twice a day at first sign of rash 10 tablet 0 Past Month at Unknown time  . albuterol (PROVENTIL HFA;VENTOLIN HFA) 108 (90 BASE) MCG/ACT inhaler Inhale 2 puffs into the lungs every 4 (four) hours as needed for wheezing or shortness of breath. 1 Inhaler 0 More than a month at Unknown time  . calcium carbonate (TUMS -  DOSED IN MG ELEMENTAL CALCIUM) 500 MG chewable tablet Chew 1 tablet by mouth 2 (two) times daily as needed for indigestion or heartburn.   More than a month at Unknown time    Review of Systems  Constitutional: Negative.   HENT: Negative.   Eyes: Negative.   Respiratory: Negative.   Cardiovascular: Negative.   Gastrointestinal: Positive for abdominal pain (upper abdomen), nausea and vomiting.  Endocrine: Negative.   Genitourinary: Negative.   Musculoskeletal: Negative.   Skin: Negative.   Allergic/Immunologic: Negative.   Neurological: Negative.   Hematological: Negative.   Psychiatric/Behavioral: Negative.    Physical Exam   Blood pressure 132/80, pulse 68, temperature 98.6 F (37 C), resp. rate 16, height 5\' 2"  (1.575 m), weight 63.5 kg, last menstrual period 12/16/2018, SpO2 100 %.  Physical Exam  Nursing note and vitals reviewed. Constitutional: She is oriented to person, place, and time. She appears well-developed and well-nourished.  HENT:  Head: Normocephalic and atraumatic.  Eyes: Pupils are equal, round, and reactive to light.  Cardiovascular: Normal rate.  Respiratory: Effort normal.  GI: Soft.  Genitourinary:    Genitourinary Comments: Not indicated   Musculoskeletal:        General: Normal range of motion.     Cervical back: Normal range of motion.  Neurological: She is alert and oriented to person, place, and time.  Skin: Skin is warm and dry.  Psychiatric: She has a normal mood and affect. Her behavior is normal. Judgment and thought content normal.    MAU Course  Procedures  MDM UDS G.I. Cocktail  PO Tolerated well -- gingerale and crackers  Results for orders placed or performed during the hospital encounter of 02/02/19 (from the past 72 hour(s))  ABO/Rh     Status: None   Collection Time: 02/02/19 11:09 AM  Result Value Ref Range   ABO/RH(D)      A POS Performed at [facility], [address], [place], [place] [address]   Rapid urine  drug screen (hospital performed)     Status: Abnormal   Collection Time: 02/02/19  2:00 PM  Result Value Ref Range   Opiates POSITIVE (A) NONE DETECTED   Cocaine NONE DETECTED NONE DETECTED   Benzodiazepines NONE DETECTED NONE DETECTED   Amphetamines NONE DETECTED NONE DETECTED   Tetrahydrocannabinol POSITIVE (A) NONE DETECTED   Barbiturates NONE DETECTED NONE DETECTED    Comment: (NOTE) DRUG SCREEN FOR MEDICAL PURPOSES ONLY.  IF CONFIRMATION IS NEEDED FOR ANY PURPOSE, NOTIFY LAB WITHIN 5 DAYS. LOWEST DETECTABLE LIMITS FOR URINE DRUG SCREEN Drug Class                     Cutoff (ng/mL) Amphetamine and metabolites    1000 Barbiturate and metabolites    200 Benzodiazepine                 200 Tricyclics and metabolites     300 Opiates and metabolites        300 Cocaine and metabolites        300 THC                            50 Performed at [facility], [address] [place], [place] [address]     Assessment and Plan  Morning sickness - Rx for Phenergan 12.5 mg every 6 hours prn N/V - Information provided on morning sickness and 1st trimester pregnancy   Heartburn during pregnancy in first trimester  - Information provided on Heartburn in pregnancy   - Discharge patient - Patient verbalized an understanding of the plan of care and agrees.     Raelyn Mora, MSN, CNM 02/02/2019, 3:42 PM

## 2019-02-02 NOTE — ED Provider Notes (Signed)
[facility] EMERGENCY DEPARTMENT Provider Note   CSN: [id number] Arrival date & time: 02/02/19  1017     History Chief Complaint  Patient presents with  . Abdominal Pain    [name] is a 24 y.o. G59P1011 female with history of polysubstance abuse who presents with abdominal pain and N/V. She states that for the past 5 days she has had recurrent N/V which has been intractable. She reports associated epigastric burning. She tried Pepto-bismol which does seem to help but she can't keep it down. She states that her LMP was 12/2. She missed her period this month and took 2 home pregnancy tests which were positive. She came to the ED yesterday but LWBS because she was too uncomfortable to wait. Labs were drawn and showed Hcg was 127K. She called her OBGYN and was prescribed Diclegis which helped with her last pregnancy but wasn't able to keep that down either. Her son and mother had an acute GI illness with N/V/D last week but their symptoms resolved in 24 hours. She also stopped her Suboxone 4-5 days ago as well (apparently her doctor stopped this). She reports fatigue and lightheadedness. She denies fever but feels hot at times. She denies chest pain, SOB, diarrhea, pelvic pain, vaginal bleeding or discharge, urinary symptoms. Has received prenatal care at [facility] in the past for her 40 year old son.  HPI     Past Medical History:  Diagnosis Date  . Asthma   . Dysmenorrhea    Tired OCPs in past X2 but reports worsening of dysmenorrhea and worsening of clots  . Genital herpes     Patient Active Problem List   Diagnosis Date Noted  . Preeclampsia, third trimester 08/05/2016  . Supervision of normal pregnancy 01/27/2016  . Genital herpes affecting pregnancy 01/27/2016    Past Surgical History:  Procedure Laterality Date  . TOOTH EXTRACTION       OB History    Gravida  1   Para  1   Term  1   Preterm  0   AB  0   Living  1     SAB  0   TAB  0     Ectopic  0   Multiple  0   Live Births  1           Family History  Problem Relation Age of Onset  . Hypertension Mother        gestational  . Diabetes Maternal Grandfather     Social History   Tobacco Use  . Smoking status: Never Smoker  . Smokeless tobacco: Never Used  Substance Use Topics  . Alcohol use: No  . Drug use: No    Home Medications Prior to Admission medications   Medication Sig Start Date End Date Taking? Authorizing Provider  acetaminophen (TYLENOL) 500 MG tablet Take 500 mg by mouth every 6 (six) hours as needed for mild pain.    [provider]  albuterol (PROVENTIL HFA;VENTOLIN HFA) 108 (90 BASE) MCG/ACT inhaler Inhale 2 puffs into the lungs every 4 (four) hours as needed for wheezing or shortness of breath. 12/19/14   Little, Wenda [place], MD  calcium carbonate (TUMS - DOSED IN MG ELEMENTAL CALCIUM) 500 MG chewable tablet Chew 1 tablet by mouth 2 (two) times daily as needed for indigestion or heartburn.    [provider]  valACYclovir (VALTREX) 1000 MG tablet 1 pill twice a day at first sign of rash 09/03/18  Eustace Moore, MD  hydrochlorothiazide (HYDRODIURIL) 25 MG tablet Take 1 tablet (25 mg total) by mouth daily. 08/09/16 09/03/18  Shirley, [place], DO    Allergies    Patient has no known allergies.  Review of Systems   Review of Systems  Constitutional: Positive for fatigue. Negative for chills and fever.  Respiratory: Negative for shortness of breath.   Cardiovascular: Negative for chest pain.  Gastrointestinal: Positive for abdominal pain, nausea and vomiting. Negative for diarrhea.  Genitourinary: Negative for dysuria, pelvic pain and vaginal bleeding.  Neurological: Positive for light-headedness.  All other systems reviewed and are negative.   Physical Exam Updated Vital Signs BP 127/80 (BP Location: Right Arm)   Pulse 73   Temp 98.6 F (37 C) (Oral)   Resp 18   Ht 5\' 2"  (1.575 m)   Wt 63.5 kg   LMP  12/16/2018   SpO2 100%   BMI 25.61 kg/m   Physical Exam Vitals and nursing note reviewed.  Constitutional:      General: She is not in acute distress.    Appearance: She is well-developed. She is not ill-appearing.  HENT:     Head: Normocephalic and atraumatic.  Eyes:     General: No scleral icterus.       Right eye: No discharge.        Left eye: No discharge.     Conjunctiva/sclera: Conjunctivae normal.     Pupils: Pupils are equal, round, and reactive to light.  Cardiovascular:     Rate and Rhythm: Normal rate.  Pulmonary:     Effort: Pulmonary effort is normal. No respiratory distress.  Abdominal:     General: Abdomen is flat. Bowel sounds are normal. There is no distension.     Palpations: Abdomen is soft.     Tenderness: There is abdominal tenderness in the epigastric area.  Musculoskeletal:     Cervical back: Normal range of motion.  Skin:    General: Skin is warm and dry.  Neurological:     Mental Status: She is alert and oriented to person, place, and time.  Psychiatric:        Behavior: Behavior normal.     ED Results / Procedures / Treatments   Labs (all labs ordered are listed, but only abnormal results are displayed) Labs Reviewed - No data to display  EKG None  Radiology No results found.  Procedures Procedures (including critical care time)  Medications Ordered in ED Medications - No data to display  ED Course  I have reviewed the triage vital signs and the nursing notes.  Pertinent labs & imaging results that were available during my care of the patient were reviewed by me and considered in my medical decision making (see chart for details).  24 year old female presents with epigastric pain and recurrent nausea and vomiting for the past 4 to 5 days.  Her vital signs are normal.  Abdomen is tender in the epigastric region. Labs reviewed from last night reveal mild hyponatremia and low bicarb.  UA has 80 ketones and high specific gravity.  hCG  quant was 127,000.  Shared visit with Dr. 25.  Bedside ultrasound performed shows IUP.  Patient symptoms are likely due to gastritis from hyperemesis of pregnancy.  We will plan on transfer to MAU.  Spoke with Hyacinth Meeker the APP at [facility] who accepts transfer.  MDM Rules/Calculators/A&P  Final Clinical Impression(s) / ED Diagnoses Final diagnoses:  Epigastric abdominal pain    Rx / DC Orders ED Discharge Orders    None       Bethel Born, PA-C 02/02/19 1115    Eber Hong, MD 02/02/19 [phone]

## 2019-02-02 NOTE — ED Notes (Signed)
Irving Burton (Mother#[phone]) called.

## 2019-02-05 ENCOUNTER — Telehealth: Payer: Self-pay | Admitting: General Practice

## 2019-02-05 NOTE — Telephone Encounter (Signed)
Patient called and left message on nurse voicemail line stating she needs to set up an appt to start prenatal care. She is about 2 months pregnant. Patient states she has called several times but cannot reach anyone.

## 2019-02-06 ENCOUNTER — Telehealth: Payer: Self-pay | Admitting: Obstetrics and Gynecology

## 2019-02-06 NOTE — Telephone Encounter (Signed)
Called the patient to advise of the upcoming appointment. Left a voicemail message stating we're calling to discuss upcoming appointment information. If you will please give our office a call back at [phone]. The information will also be available in [place] and an appointment reminder will also be sent in the mail.

## 2019-02-15 ENCOUNTER — Other Ambulatory Visit: Payer: Self-pay

## 2019-02-15 ENCOUNTER — Ambulatory Visit (INDEPENDENT_AMBULATORY_CARE_PROVIDER_SITE_OTHER): Payer: Medicaid Other | Admitting: *Deleted

## 2019-02-15 DIAGNOSIS — G8929 Other chronic pain: Secondary | ICD-10-CM | POA: Insufficient documentation

## 2019-02-15 DIAGNOSIS — O0990 Supervision of high risk pregnancy, unspecified, unspecified trimester: Secondary | ICD-10-CM | POA: Insufficient documentation

## 2019-02-15 DIAGNOSIS — O09299 Supervision of pregnancy with other poor reproductive or obstetric history, unspecified trimester: Secondary | ICD-10-CM

## 2019-02-15 DIAGNOSIS — G8921 Chronic pain due to trauma: Secondary | ICD-10-CM

## 2019-02-15 DIAGNOSIS — J4520 Mild intermittent asthma, uncomplicated: Secondary | ICD-10-CM | POA: Insufficient documentation

## 2019-02-15 DIAGNOSIS — O1490 Unspecified pre-eclampsia, unspecified trimester: Secondary | ICD-10-CM | POA: Insufficient documentation

## 2019-02-15 DIAGNOSIS — A6000 Herpesviral infection of urogenital system, unspecified: Secondary | ICD-10-CM | POA: Insufficient documentation

## 2019-02-15 DIAGNOSIS — O09899 Supervision of other high risk pregnancies, unspecified trimester: Secondary | ICD-10-CM

## 2019-02-15 MED ORDER — BLOOD PRESSURE KIT DEVI: 1.0000 | 0 refills | Status: AC | PRN
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
## 2019-02-15 NOTE — Progress Notes (Signed)
4:05pm I called Jehan back and left a message that we were not quite finished with her visit but close enough that we can finish when she comes in on 03/01/19; but I will call you once more today. Amen Staszak,RN  4:08pm, I called Thetis again to finish her visit and left a message that since we did not finish and I can't reach her we will finish at her visit 03/01/19- and to let us know if she has questions and to check her MyChart as I might send information to  Her.  Shizuo Biskup,RN  4:29 I scheduled [place]. I called [facility] and verified they received her order and will deliver to our office. Corita Allinson,RN

## 2019-02-15 NOTE — Patient Instructions (Signed)
At [facility] [employer], we work as an integrated team, providing care to address both physical and emotional health. Your medical provider may refer you to see our Behavioral Health Clinician [facility]) on the same day you see your medical provider, as availability permits.  Our [facility] is available to all patients, visits generally last between 20-30 minutes, but can be longer or shorter, depending on patient need. The [facility] offers help with stress management, coping with symptoms of depression and anxiety, major life changes , sleep issues, changing risky behavior, grief and loss, life stress, working on personal life goals, and  behavioral health issues, as these all affect your overall health and wellness.  The [facility] is NOT available for the following: court-ordered evaluations, specialty assessments (custody or disability), letters to employers, or obtaining certification for an emotional support animal. The [facility] does not provide long-term therapeutic services. You have the right to refuse integrated behavioral health services, or to reschedule to see the [facility] at a later date.  Exception: If you are having thoughts of suicide, we require that you either see the [facility] for further assessment, or contract for safety with your medical provider prior to checking out.  Confidentiality exception: If it is suspected that a child or disabled adult is being abused or neglected, we are required by law to report that to either Child Protective Services or Adult Management consultant.  If you have a diagnosis of Bipolar affective disorder, Schizophrenia, or recurrent Major depressive disorder, we will recommend that you establish care with a psychiatrist, as these are lifelong, chronic conditions, and we want your overall emotional health and medications to be more closely monitored. If you anticipate needing extended maternity leave due to mental illness, it it recommended that you find a psychiatrist as soon as possible.  Neither the medical provider, nor the [facility], can recommend an extended maternity leave for mental health issues. Your medical provider or [facility] may refer you to a therapist for ongoing, traditional therapy, or to a psychiatrist, for medication management, if it would benefit your overall health. Depending on your insurance, you may have a copay to see the [facility]. If you are uninsured, it is recommended that you apply for financial assistance. (Forms may be requested at the front desk for in-person visits, via MyChart, or request a form during a virtual visit).  If you see the [facility] more than 6 times, you will have to complete a comprehensive clinical assessment interview with the [facility] to resume integrated services.  Any questions?    Transportation Resources [place] [employer] ([employer]) [address] [place], [place], [place] [address] https://www.[place].gov/departments/transportation/gdot-divisions/[place]-transit-agency-public-transportation-division     . Fixed-route bus services, including regional fare cards for PART, [place], [place], and WSTA buses.  . Reduced fare bus ID's available for Medicaid, Medicare, and "orange card" recipients.  [employer] offers curb-to-curb and door-to-door bus services for people with disabilities who are unable to use a fixed-bus route; also offers a shared-ride program.   Helpful tips:  -Routes available online and physical maps available at the main bus hub lobby (each for a specific route) -Smartphone directions often include bus routes (see the "bus" icon, next to the "car" and "walk" icons) -Routes differ on weekends, evenings and holidays, so plan ahead!  -If you have Medicaid, Medicare, or orange card, plan to obtain a reduced-fare ID to save 50% on rides. Check days and times to obtain an ID, and bring all necessary documents.  [employer] [place]) [place], [place]. [address], [place], [place] [address] [phone] http://www.smith-bell.org/ **Fixed-bus route services, and demand response bus service for older adults  Department of Social [facility] [address] [place], [place] [address] [phone] www.ProfileWatcher.fi **Medicaid transportation is available to [facility] recipients who need assistance getting to [facility] medical appointments and providers  [facility]) [address] [place] [address], [place], [place] [address] www.cjmedicaltransportation.com  ** Offers non-emergency transportation for medical appointments  [employer] [place], [place], [place] [address] [phone] www.wheels4hope.org **REFERRAL NEEDED by specific agencies (see website), after meeting specified criteria only  [employer] for [employer]) [address] [place], [place] [address] [phone]  BikerFestival.is  *Regional fixed-bus routes between counties (example: [place] to [place]) and [employer] of [place], [place], [place] [address] [phone] Http://senior-resources-[place].org  Production assistant, radio available (call or see website for details)  [place] [address], [place], [place] [address] [phone] www.senioradults.org  **[place], Disability and [place] [address], [place], [place] [address] [phone] www.adtsrc.[place] Department of Health and [facility] [place], [place], [place] [address] [phone]  http://goodwin-walker.biz/  **Transportation expense assistance  Department of [place] [address] [place], [place], [place] [address] [phone] www.co.[place]..us/pview.aspx?id=[email or web address]&[email or web address]  **Medicaid transportation for recipients who need assistance getting to [facility] medical appointments and providers       If you are in need of transportation to get to and from your appointments in our office.  You can reach Transportation Services by calling [phone] Monday - Friday  7am-6pm.

## 2019-03-01 ENCOUNTER — Encounter: Payer: Self-pay | Admitting: Obstetrics and Gynecology

## 2019-03-01 ENCOUNTER — Ambulatory Visit (INDEPENDENT_AMBULATORY_CARE_PROVIDER_SITE_OTHER): Payer: Medicaid Other | Admitting: Obstetrics and Gynecology

## 2019-03-01 ENCOUNTER — Encounter: Payer: Medicaid Other | Admitting: Obstetrics and Gynecology

## 2019-03-01 ENCOUNTER — Other Ambulatory Visit (HOSPITAL_COMMUNITY)
Admission: RE | Admit: 2019-03-01 | Discharge: 2019-03-01 | Disposition: A | Discharge status: Home / Self Care | Payer: Medicaid Other | Source: Ambulatory Visit | Attending: Obstetrics and Gynecology | Admitting: Obstetrics and Gynecology

## 2019-03-01 ENCOUNTER — Other Ambulatory Visit: Payer: Self-pay

## 2019-03-01 VITALS — BP 115/70 | HR 83 | Wt 123.2 lb

## 2019-03-01 DIAGNOSIS — F1120 Opioid dependence, uncomplicated: Secondary | ICD-10-CM

## 2019-03-01 DIAGNOSIS — O09291 Supervision of pregnancy with other poor reproductive or obstetric history, first trimester: Secondary | ICD-10-CM

## 2019-03-01 DIAGNOSIS — O210 Mild hyperemesis gravidarum: Secondary | ICD-10-CM

## 2019-03-01 DIAGNOSIS — Z8619 Personal history of other infectious and parasitic diseases: Secondary | ICD-10-CM

## 2019-03-01 DIAGNOSIS — O09299 Supervision of pregnancy with other poor reproductive or obstetric history, unspecified trimester: Secondary | ICD-10-CM

## 2019-03-01 DIAGNOSIS — J4520 Mild intermittent asthma, uncomplicated: Secondary | ICD-10-CM

## 2019-03-01 DIAGNOSIS — F1210 Cannabis abuse, uncomplicated: Secondary | ICD-10-CM

## 2019-03-01 DIAGNOSIS — O99320 Drug use complicating pregnancy, unspecified trimester: Secondary | ICD-10-CM | POA: Diagnosis not present

## 2019-03-01 DIAGNOSIS — O0990 Supervision of high risk pregnancy, unspecified, unspecified trimester: Secondary | ICD-10-CM | POA: Diagnosis not present

## 2019-03-01 DIAGNOSIS — O0991 Supervision of high risk pregnancy, unspecified, first trimester: Secondary | ICD-10-CM

## 2019-03-01 DIAGNOSIS — Z3A10 10 weeks gestation of pregnancy: Secondary | ICD-10-CM

## 2019-03-01 DIAGNOSIS — O99321 Drug use complicating pregnancy, first trimester: Secondary | ICD-10-CM

## 2019-03-01 DIAGNOSIS — G8921 Chronic pain due to trauma: Secondary | ICD-10-CM

## 2019-03-01 MED ORDER — ASPIRIN EC 81 MG PO TBEC: 81.0000 mg | DELAYED_RELEASE_TABLET | Freq: Every day | ORAL | 3 refills | Status: DC

## 2019-03-01 NOTE — Progress Notes (Signed)
Medicaid Home Form Completed-03/01/19
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
## 2019-03-01 NOTE — Progress Notes (Signed)
Pt states has been weaning herself off from Subutex due to pregnancy.

## 2019-03-02 ENCOUNTER — Encounter: Payer: Self-pay | Admitting: *Deleted

## 2019-03-02 LAB — OBSTETRIC PANEL, INCLUDING HIV
Antibody Screen: NEGATIVE
Basophils Absolute: 0 10*3/uL (ref 0.0–0.2)
Basos: 0 %
EOS (ABSOLUTE): 0.2 10*3/uL (ref 0.0–0.4)
Eos: 2 %
HIV Screen 4th Generation wRfx: NONREACTIVE
Hematocrit: 40 % (ref 34.0–46.6)
Hemoglobin: 13.5 g/dL (ref 11.1–15.9)
Hepatitis B Surface Ag: NEGATIVE
Immature Grans (Abs): 0 10*3/uL (ref 0.0–0.1)
Immature Granulocytes: 0 %
Lymphocytes Absolute: 1.3 10*3/uL (ref 0.7–3.1)
Lymphs: 17 %
MCH: 32.9 pg (ref 26.6–33.0)
MCHC: 33.8 g/dL (ref 31.5–35.7)
MCV: 98 fL — ABNORMAL HIGH (ref 79–97)
Monocytes Absolute: 0.4 10*3/uL (ref 0.1–0.9)
Monocytes: 5 %
Neutrophils Absolute: 6 10*3/uL (ref 1.4–7.0)
Neutrophils: 76 %
Platelets: 184 10*3/uL (ref 150–450)
RBC: 4.1 x10E6/uL (ref 3.77–5.28)
RDW: 11.7 % (ref 11.7–15.4)
RPR Ser Ql: NONREACTIVE
Rh Factor: POSITIVE
Rubella Antibodies, IGG: 0.93 index — ABNORMAL LOW (ref >0.99)
WBC: 8 10*3/uL (ref 3.4–10.8)

## 2019-03-02 LAB — COMPREHENSIVE METABOLIC PANEL
ALT: 11 IU/L (ref 0–32)
AST: 17 IU/L (ref 0–40)
Albumin/Globulin Ratio: 1.9 (ref 1.2–2.2)
Albumin: 4.1 g/dL (ref 3.9–5.0)
Alkaline Phosphatase: 54 IU/L (ref 39–117)
BUN/Creatinine Ratio: 10 (ref 9–23)
BUN: 7 mg/dL (ref 6–20)
Bilirubin Total: 0.3 mg/dL (ref 0.0–1.2)
CO2: 20 mmol/L (ref 20–29)
Calcium: 9 mg/dL (ref 8.7–10.2)
Chloride: 102 mmol/L (ref 96–106)
Creatinine, Ser: 0.73 mg/dL (ref 0.57–1.00)
GFR calc Af Amer: 133 mL/min/{1.73_m2} (ref >59)
GFR calc non Af Amer: 116 mL/min/{1.73_m2} (ref >59)
Globulin, Total: 2.2 g/dL (ref 1.5–4.5)
Glucose: 83 mg/dL (ref 65–99)
Potassium: 4.3 mmol/L (ref 3.5–5.2)
Sodium: 136 mmol/L (ref 134–144)
Total Protein: 6.3 g/dL (ref 6.0–8.5)

## 2019-03-02 LAB — HEMOGLOBIN A1C
Est. average glucose Bld gHb Est-mCnc: 85 mg/dL
Hgb A1c MFr Bld: 4.6 % — ABNORMAL LOW (ref 4.8–5.6)

## 2019-03-02 LAB — CYTOLOGY - PAP
Chlamydia: NEGATIVE
Comment: NEGATIVE
Comment: NEGATIVE
Comment: NORMAL
Diagnosis: NEGATIVE
Neisseria Gonorrhea: NEGATIVE
Trichomonas: NEGATIVE

## 2019-03-02 LAB — PROTEIN / CREATININE RATIO, URINE
Creatinine, Urine: 63.2 mg/dL
Protein, Ur: 5 mg/dL
Protein/Creat Ratio: 79 mg/g creat (ref 0–200)

## 2019-03-02 LAB — TSH: TSH: 0.164 u[IU]/mL — ABNORMAL LOW (ref 0.450–4.500)

## 2019-03-02 LAB — HEPATITIS C ANTIBODY: Hep C Virus Ab: <0.1 s/co ratio (ref 0.0–0.9)

## 2019-03-03 LAB — CULTURE, OB URINE

## 2019-03-03 LAB — URINE CULTURE, OB REFLEX

## 2019-03-05 ENCOUNTER — Encounter: Payer: Self-pay | Admitting: Obstetrics and Gynecology

## 2019-03-05 ENCOUNTER — Other Ambulatory Visit: Payer: Self-pay

## 2019-03-05 DIAGNOSIS — R7989 Other specified abnormal findings of blood chemistry: Secondary | ICD-10-CM | POA: Insufficient documentation

## 2019-03-05 DIAGNOSIS — O0990 Supervision of high risk pregnancy, unspecified, unspecified trimester: Secondary | ICD-10-CM

## 2019-03-05 DIAGNOSIS — Z789 Other specified health status: Secondary | ICD-10-CM | POA: Insufficient documentation

## 2019-03-05 DIAGNOSIS — Z283 Underimmunization status: Secondary | ICD-10-CM | POA: Insufficient documentation

## 2019-03-07 LAB — SPECIMEN STATUS REPORT

## 2019-03-07 LAB — T4, FREE: Free T4: 1.59 ng/dL (ref 0.82–1.77)

## 2019-03-12 ENCOUNTER — Encounter: Payer: Self-pay | Admitting: *Deleted

## 2019-03-22 ENCOUNTER — Encounter: Payer: Self-pay | Admitting: *Deleted

## 2019-03-29 ENCOUNTER — Encounter: Payer: Self-pay | Admitting: *Deleted

## 2019-03-29 ENCOUNTER — Other Ambulatory Visit: Payer: Self-pay

## 2019-03-29 ENCOUNTER — Ambulatory Visit (INDEPENDENT_AMBULATORY_CARE_PROVIDER_SITE_OTHER): Payer: Medicaid Other | Admitting: Obstetrics and Gynecology

## 2019-03-29 VITALS — BP 131/85 | HR 73 | Wt 126.7 lb

## 2019-03-29 DIAGNOSIS — O210 Mild hyperemesis gravidarum: Secondary | ICD-10-CM

## 2019-03-29 DIAGNOSIS — O99320 Drug use complicating pregnancy, unspecified trimester: Secondary | ICD-10-CM

## 2019-03-29 DIAGNOSIS — F1120 Opioid dependence, uncomplicated: Secondary | ICD-10-CM

## 2019-03-29 DIAGNOSIS — O0990 Supervision of high risk pregnancy, unspecified, unspecified trimester: Secondary | ICD-10-CM

## 2019-03-29 DIAGNOSIS — O09299 Supervision of pregnancy with other poor reproductive or obstetric history, unspecified trimester: Secondary | ICD-10-CM

## 2019-03-29 MED ORDER — ONDANSETRON 8 MG PO TBDP: 8.0000 mg | ORAL_TABLET | Freq: Three times a day (TID) | ORAL | 1 refills | Status: DC | PRN

## 2019-03-29 MED ORDER — ASPIRIN EC 81 MG PO TBEC: 81.0000 mg | DELAYED_RELEASE_TABLET | Freq: Every day | ORAL | 3 refills | Status: DC

## 2019-03-29 NOTE — Progress Notes (Signed)
error 
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
## 2019-04-24 ENCOUNTER — Telehealth (INDEPENDENT_AMBULATORY_CARE_PROVIDER_SITE_OTHER): Payer: Medicaid Other | Admitting: Family Medicine

## 2019-04-24 DIAGNOSIS — O09299 Supervision of pregnancy with other poor reproductive or obstetric history, unspecified trimester: Secondary | ICD-10-CM | POA: Diagnosis not present

## 2019-04-24 DIAGNOSIS — Z3A19 19 weeks gestation of pregnancy: Secondary | ICD-10-CM

## 2019-04-24 DIAGNOSIS — O99893 Other specified diseases and conditions complicating puerperium: Secondary | ICD-10-CM | POA: Diagnosis not present

## 2019-04-24 DIAGNOSIS — O99322 Drug use complicating pregnancy, second trimester: Secondary | ICD-10-CM

## 2019-04-24 DIAGNOSIS — O99612 Diseases of the digestive system complicating pregnancy, second trimester: Secondary | ICD-10-CM

## 2019-04-24 DIAGNOSIS — J4520 Mild intermittent asthma, uncomplicated: Secondary | ICD-10-CM

## 2019-04-24 DIAGNOSIS — O98312 Other infections with a predominantly sexual mode of transmission complicating pregnancy, second trimester: Secondary | ICD-10-CM

## 2019-04-24 DIAGNOSIS — O210 Mild hyperemesis gravidarum: Secondary | ICD-10-CM

## 2019-04-24 DIAGNOSIS — R12 Heartburn: Secondary | ICD-10-CM

## 2019-04-24 DIAGNOSIS — A6000 Herpesviral infection of urogenital system, unspecified: Secondary | ICD-10-CM

## 2019-04-24 DIAGNOSIS — G8921 Chronic pain due to trauma: Secondary | ICD-10-CM

## 2019-04-24 DIAGNOSIS — O26899 Other specified pregnancy related conditions, unspecified trimester: Secondary | ICD-10-CM

## 2019-04-24 DIAGNOSIS — F1120 Opioid dependence, uncomplicated: Secondary | ICD-10-CM

## 2019-04-24 DIAGNOSIS — O99320 Drug use complicating pregnancy, unspecified trimester: Secondary | ICD-10-CM

## 2019-04-24 DIAGNOSIS — O9952 Diseases of the respiratory system complicating childbirth: Secondary | ICD-10-CM

## 2019-04-24 DIAGNOSIS — O0990 Supervision of high risk pregnancy, unspecified, unspecified trimester: Secondary | ICD-10-CM

## 2019-04-24 NOTE — Progress Notes (Signed)
I connected with  Beth Carr on 04/24/19 at 1545 by telephone and verified that I am speaking with the correct person using two identifiers.   I discussed the limitations, risks, security and privacy concerns of performing an evaluation and management service by telephone and the availability of in person appointments. I also discussed with the patient that there may be a patient responsible charge related to this service. The patient expressed understanding and agreed to proceed.  Pt does not have BP cuff at this time. Reports checking BP 3-4 days ago when she had a headache with a normal result. Babyscripts ordered and pt given instructions to log BP when she is at home. Explained she should check BP weekly and anytime she has symptoms of elevated BP.  Marjo Bicker, RN 04/24/2019  3:44 PM
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
## 2019-04-27 ENCOUNTER — Ambulatory Visit (HOSPITAL_COMMUNITY): Payer: Medicaid Other | Admitting: *Deleted

## 2019-04-27 ENCOUNTER — Encounter (HOSPITAL_COMMUNITY): Payer: Self-pay

## 2019-04-27 ENCOUNTER — Ambulatory Visit (HOSPITAL_COMMUNITY)
Admission: RE | Admit: 2019-04-27 | Discharge: 2019-04-27 | Disposition: A | Discharge status: Home / Self Care | Payer: Medicaid Other | Source: Ambulatory Visit | Attending: Obstetrics and Gynecology | Admitting: Obstetrics and Gynecology

## 2019-04-27 ENCOUNTER — Other Ambulatory Visit: Payer: Self-pay | Admitting: Obstetrics and Gynecology

## 2019-04-27 ENCOUNTER — Other Ambulatory Visit (HOSPITAL_COMMUNITY): Payer: Self-pay | Admitting: *Deleted

## 2019-04-27 ENCOUNTER — Ambulatory Visit (HOSPITAL_COMMUNITY): Payer: Medicaid Other

## 2019-04-27 ENCOUNTER — Other Ambulatory Visit: Payer: Self-pay

## 2019-04-27 DIAGNOSIS — F1120 Opioid dependence, uncomplicated: Secondary | ICD-10-CM

## 2019-04-27 DIAGNOSIS — O26899 Other specified pregnancy related conditions, unspecified trimester: Secondary | ICD-10-CM

## 2019-04-27 DIAGNOSIS — O30032 Twin pregnancy, monochorionic/diamniotic, second trimester: Secondary | ICD-10-CM | POA: Diagnosis not present

## 2019-04-27 DIAGNOSIS — J4520 Mild intermittent asthma, uncomplicated: Secondary | ICD-10-CM | POA: Diagnosis present

## 2019-04-27 DIAGNOSIS — A6000 Herpesviral infection of urogenital system, unspecified: Secondary | ICD-10-CM

## 2019-04-27 DIAGNOSIS — O09299 Supervision of pregnancy with other poor reproductive or obstetric history, unspecified trimester: Secondary | ICD-10-CM | POA: Insufficient documentation

## 2019-04-27 DIAGNOSIS — G8921 Chronic pain due to trauma: Secondary | ICD-10-CM | POA: Diagnosis present

## 2019-04-27 DIAGNOSIS — R12 Heartburn: Secondary | ICD-10-CM

## 2019-04-27 DIAGNOSIS — O0990 Supervision of high risk pregnancy, unspecified, unspecified trimester: Secondary | ICD-10-CM | POA: Diagnosis present

## 2019-04-27 DIAGNOSIS — Z3A18 18 weeks gestation of pregnancy: Secondary | ICD-10-CM

## 2019-04-27 DIAGNOSIS — O30039 Twin pregnancy, monochorionic/diamniotic, unspecified trimester: Secondary | ICD-10-CM

## 2019-04-27 DIAGNOSIS — O99322 Drug use complicating pregnancy, second trimester: Secondary | ICD-10-CM

## 2019-04-27 DIAGNOSIS — O210 Mild hyperemesis gravidarum: Secondary | ICD-10-CM | POA: Diagnosis present

## 2019-04-27 DIAGNOSIS — O09292 Supervision of pregnancy with other poor reproductive or obstetric history, second trimester: Secondary | ICD-10-CM

## 2019-04-27 NOTE — Consult Note (Addendum)
MFM Note  This patient was seen due to a spontaneously conceived twin pregnancy.  This is the first ultrasound that the patient has had in her current pregnancy.  The patient did not know that she had a twin gestation prior to today's ultrasound exam.  She is currently treated with Suboxone.  She reports that her prior pregnancy was complicated by preeclampsia.  Due to this history, she is currently taking a daily baby aspirin for preeclampsia prophylaxis.   She had a cell free DNA test earlier in her pregnancy which indicated a low risk for trisomy 35, 58, and 13.  A female fetus is predicted.  As the cell free DNA test was not ordered for a twin gestation, only a singleton result was reported.  However the fetal fraction was extremely high at 19.5%.  Our genetic counselor is calling the [facility] to inform them of the twin gestation and will have them issue a revised report.  A thin dividing membrane was noted separating the two fetuses along with a single placenta, indicating that these are monochorionic, diamniotic twins.  The fetal growth and amniotic fluid level appeared appropriate for her gestational age for both fetuses.  Two female fetuses were noted on today's exam.  The patient and her partner were both shocked regarding the twin gestation.  Her partner claimed that these could not be his children because there is no family history of twins.  They were reassured that twin gestations occur randomly and spontaneously in nature.  They were advised that as these are monochorionic (identical) twins, that the embryo split within days 4-8 after fertilization after one egg was fertilized by one sperm.  He was advised that the paternity cannot be determined based on the number of fetuses in a pregnancy.  The views of the fetal anatomy were suboptimal today due to the fetal positions.  However, there were no obvious anomalies suspected in either fetus.  The limitations of ultrasound in the detection of  all anomalies including fetal aneuploidy was discussed with the patient today.  The implications and management of monochorionic twins was discussed. The 10% to 15% risk of twin to twin transfusion syndrome seen in monochorionic, diamniotic twins was discussed today.  The implications and management of twin to twin transfusion syndrome (TTTS) should she develop this complication was also discussed.  She was advised that we will continue to follow her closely with serial ultrasounds to assess for signs of TTTS. She was advised that management of twin pregnancies will involve frequent ultrasound exams to assess the fetal growth and amniotic fluid level.  We will continue to follow her with biweekly ultrasounds to assess for signs of the twin to twin transfusion syndrome. Weekly fetal testing should be started at around 32 weeks.  Delivery for uncomplicated monochorionic twins is recommended at around 37 weeks.  The increased risk of preeclampsia, gestational diabetes, and preterm birth/labor associated with twin pregnancies was discussed.  She was advised that we will continue to follow her closely to assess for these conditions. As pregnancies with multiple gestations are at increased risk for developing preeclampsia, she was advised to continue taking a daily baby aspirin (81 mg per day) to decrease her risk of developing preeclampsia again.   A follow-up exam was scheduled in 2 weeks.  Due to the monochorionic twin gestation, she will also be referred for a fetal echocardiogram with pediatric cardiology in about 2 to 3 weeks.  A total of 30 minutes was spent counseling and coordinating  the care for this patient.  Greater than 50% of the time was spent in direct face-to-face contact.

## 2019-04-30 ENCOUNTER — Encounter: Payer: Self-pay | Admitting: Obstetrics and Gynecology

## 2019-04-30 DIAGNOSIS — O30039 Twin pregnancy, monochorionic/diamniotic, unspecified trimester: Secondary | ICD-10-CM | POA: Insufficient documentation

## 2019-05-01 ENCOUNTER — Telehealth: Payer: Self-pay | Admitting: Family Medicine

## 2019-05-01 ENCOUNTER — Encounter: Payer: Self-pay | Admitting: Obstetrics and Gynecology

## 2019-05-01 ENCOUNTER — Telehealth: Payer: Self-pay

## 2019-05-01 DIAGNOSIS — O0990 Supervision of high risk pregnancy, unspecified, unspecified trimester: Secondary | ICD-10-CM

## 2019-05-01 NOTE — Telephone Encounter (Signed)
Called pt and pt stated that she needed a note stating that she can not work because she is pregnant with twins and has asthma during COVID.  Pt reports that she has stopped receiving unemployment because she needs the letter.  I informed pt that I apologized but we can not medically take her out of work for asthma during COVID.  I advised pt that we can prescribe her an inhaler and we highly recommend that she continues to wear her mask, socially distance, and wash her hands.  Pt gets quite and the phone disconnects.    Addison Naegeli, RN

## 2019-05-01 NOTE — Telephone Encounter (Signed)
Pt initially called wanting a letter sent to Unemployment office stating that she is not able to work due her being HROB with twins & having Asthma. Called pt back to advise that we are not able to send A letter to Unemployment for that type of request, Asked pt to hold & then she hung up. Tried to call her back but she didn't pick up.

## 2019-05-01 NOTE — Telephone Encounter (Signed)
Patient called and said she need a note stating that she is pregnant with twins and she can't work due her  not wanting to get covid because she is pregnant with twins.

## 2019-05-11 ENCOUNTER — Encounter: Payer: Self-pay | Admitting: *Deleted

## 2019-05-11 ENCOUNTER — Other Ambulatory Visit: Payer: Self-pay

## 2019-05-11 ENCOUNTER — Ambulatory Visit (HOSPITAL_COMMUNITY): Payer: Medicaid Other | Attending: Obstetrics and Gynecology

## 2019-05-11 ENCOUNTER — Ambulatory Visit: Payer: Medicaid Other | Admitting: *Deleted

## 2019-05-11 DIAGNOSIS — O30032 Twin pregnancy, monochorionic/diamniotic, second trimester: Secondary | ICD-10-CM

## 2019-05-11 DIAGNOSIS — Z363 Encounter for antenatal screening for malformations: Secondary | ICD-10-CM

## 2019-05-11 DIAGNOSIS — A6000 Herpesviral infection of urogenital system, unspecified: Secondary | ICD-10-CM | POA: Insufficient documentation

## 2019-05-11 DIAGNOSIS — O0990 Supervision of high risk pregnancy, unspecified, unspecified trimester: Secondary | ICD-10-CM

## 2019-05-11 DIAGNOSIS — O99322 Drug use complicating pregnancy, second trimester: Secondary | ICD-10-CM

## 2019-05-11 DIAGNOSIS — O30039 Twin pregnancy, monochorionic/diamniotic, unspecified trimester: Secondary | ICD-10-CM | POA: Diagnosis not present

## 2019-05-11 DIAGNOSIS — O210 Mild hyperemesis gravidarum: Secondary | ICD-10-CM | POA: Insufficient documentation

## 2019-05-11 DIAGNOSIS — O09292 Supervision of pregnancy with other poor reproductive or obstetric history, second trimester: Secondary | ICD-10-CM | POA: Diagnosis not present

## 2019-05-11 DIAGNOSIS — Z79891 Long term (current) use of opiate analgesic: Secondary | ICD-10-CM

## 2019-05-11 DIAGNOSIS — F1120 Opioid dependence, uncomplicated: Secondary | ICD-10-CM

## 2019-05-11 DIAGNOSIS — Z3A20 20 weeks gestation of pregnancy: Secondary | ICD-10-CM

## 2019-05-25 ENCOUNTER — Ambulatory Visit: Payer: Medicaid Other

## 2019-05-25 ENCOUNTER — Encounter: Payer: Medicaid Other | Admitting: Obstetrics & Gynecology

## 2019-05-28 ENCOUNTER — Telehealth: Payer: Self-pay | Admitting: Family Medicine

## 2019-05-28 ENCOUNTER — Encounter: Payer: Medicaid Other | Admitting: Family Medicine

## 2019-05-28 NOTE — Telephone Encounter (Signed)
Called patient and left a voicemail message for her to call the office for a rescheduled appointment information.

## 2019-05-30 ENCOUNTER — Ambulatory Visit (INDEPENDENT_AMBULATORY_CARE_PROVIDER_SITE_OTHER): Payer: Medicaid Other | Admitting: Family Medicine

## 2019-05-30 ENCOUNTER — Other Ambulatory Visit: Payer: Self-pay

## 2019-05-30 VITALS — BP 113/57 | HR 60 | Wt 137.6 lb

## 2019-05-30 DIAGNOSIS — F1120 Opioid dependence, uncomplicated: Secondary | ICD-10-CM

## 2019-05-30 DIAGNOSIS — K641 Second degree hemorrhoids: Secondary | ICD-10-CM

## 2019-05-30 DIAGNOSIS — Z3A23 23 weeks gestation of pregnancy: Secondary | ICD-10-CM

## 2019-05-30 DIAGNOSIS — O30032 Twin pregnancy, monochorionic/diamniotic, second trimester: Secondary | ICD-10-CM

## 2019-05-30 DIAGNOSIS — A6009 Herpesviral infection of other urogenital tract: Secondary | ICD-10-CM

## 2019-05-30 DIAGNOSIS — Z283 Underimmunization status: Secondary | ICD-10-CM

## 2019-05-30 DIAGNOSIS — K5901 Slow transit constipation: Secondary | ICD-10-CM

## 2019-05-30 DIAGNOSIS — O98312 Other infections with a predominantly sexual mode of transmission complicating pregnancy, second trimester: Secondary | ICD-10-CM

## 2019-05-30 DIAGNOSIS — O99322 Drug use complicating pregnancy, second trimester: Secondary | ICD-10-CM

## 2019-05-30 DIAGNOSIS — O99320 Drug use complicating pregnancy, unspecified trimester: Secondary | ICD-10-CM

## 2019-05-30 DIAGNOSIS — O09899 Supervision of other high risk pregnancies, unspecified trimester: Secondary | ICD-10-CM

## 2019-05-30 DIAGNOSIS — O09299 Supervision of pregnancy with other poor reproductive or obstetric history, unspecified trimester: Secondary | ICD-10-CM

## 2019-05-30 DIAGNOSIS — O98319 Other infections with a predominantly sexual mode of transmission complicating pregnancy, unspecified trimester: Secondary | ICD-10-CM

## 2019-05-30 DIAGNOSIS — O99891 Other specified diseases and conditions complicating pregnancy: Secondary | ICD-10-CM

## 2019-05-30 DIAGNOSIS — O99612 Diseases of the digestive system complicating pregnancy, second trimester: Secondary | ICD-10-CM

## 2019-05-30 DIAGNOSIS — O0990 Supervision of high risk pregnancy, unspecified, unspecified trimester: Secondary | ICD-10-CM

## 2019-05-30 DIAGNOSIS — G8921 Chronic pain due to trauma: Secondary | ICD-10-CM

## 2019-05-30 DIAGNOSIS — O09292 Supervision of pregnancy with other poor reproductive or obstetric history, second trimester: Secondary | ICD-10-CM

## 2019-05-30 MED ORDER — VALACYCLOVIR HCL 1 G PO TABS: 500.0000 mg | ORAL_TABLET | Freq: Two times a day (BID) | ORAL | 2 refills | Status: DC

## 2019-05-30 MED ORDER — DOCUSATE SODIUM 100 MG PO CAPS: 100.0000 mg | ORAL_CAPSULE | Freq: Two times a day (BID) | ORAL | 2 refills | Status: DC | PRN

## 2019-05-30 MED ORDER — HYDROCORTISONE ACETATE 25 MG RE SUPP: 25.0000 mg | Freq: Two times a day (BID) | RECTAL | 0 refills | Status: DC

## 2019-05-30 NOTE — Patient Instructions (Signed)
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
## 2019-05-31 ENCOUNTER — Ambulatory Visit: Payer: Medicaid Other | Attending: Obstetrics

## 2019-05-31 ENCOUNTER — Ambulatory Visit: Payer: Medicaid Other | Admitting: *Deleted

## 2019-05-31 DIAGNOSIS — Z3A23 23 weeks gestation of pregnancy: Secondary | ICD-10-CM

## 2019-05-31 DIAGNOSIS — Z362 Encounter for other antenatal screening follow-up: Secondary | ICD-10-CM

## 2019-05-31 DIAGNOSIS — O210 Mild hyperemesis gravidarum: Secondary | ICD-10-CM | POA: Diagnosis present

## 2019-05-31 DIAGNOSIS — Z79891 Long term (current) use of opiate analgesic: Secondary | ICD-10-CM

## 2019-05-31 DIAGNOSIS — O30032 Twin pregnancy, monochorionic/diamniotic, second trimester: Secondary | ICD-10-CM | POA: Diagnosis not present

## 2019-05-31 DIAGNOSIS — O0990 Supervision of high risk pregnancy, unspecified, unspecified trimester: Secondary | ICD-10-CM | POA: Diagnosis present

## 2019-05-31 DIAGNOSIS — O30039 Twin pregnancy, monochorionic/diamniotic, unspecified trimester: Secondary | ICD-10-CM | POA: Insufficient documentation

## 2019-05-31 DIAGNOSIS — A6009 Herpesviral infection of other urogenital tract: Secondary | ICD-10-CM | POA: Insufficient documentation

## 2019-05-31 DIAGNOSIS — O99322 Drug use complicating pregnancy, second trimester: Secondary | ICD-10-CM

## 2019-05-31 DIAGNOSIS — O09292 Supervision of pregnancy with other poor reproductive or obstetric history, second trimester: Secondary | ICD-10-CM

## 2019-05-31 DIAGNOSIS — F1120 Opioid dependence, uncomplicated: Secondary | ICD-10-CM

## 2019-06-01 ENCOUNTER — Other Ambulatory Visit: Payer: Self-pay | Admitting: *Deleted

## 2019-06-01 DIAGNOSIS — O30039 Twin pregnancy, monochorionic/diamniotic, unspecified trimester: Secondary | ICD-10-CM

## 2019-06-07 ENCOUNTER — Encounter: Payer: Self-pay | Admitting: Pediatrics

## 2019-06-07 ENCOUNTER — Other Ambulatory Visit: Payer: Self-pay | Admitting: *Deleted

## 2019-06-07 DIAGNOSIS — O30039 Twin pregnancy, monochorionic/diamniotic, unspecified trimester: Secondary | ICD-10-CM

## 2019-06-14 ENCOUNTER — Ambulatory Visit: Payer: Medicaid Other | Attending: Obstetrics and Gynecology

## 2019-06-14 ENCOUNTER — Ambulatory Visit: Payer: Medicaid Other

## 2019-06-21 ENCOUNTER — Telehealth: Payer: Self-pay

## 2019-06-21 NOTE — Telephone Encounter (Signed)
Patient called with complaints of back and abdominal pain, requesting a sooner appointment to see a provider, explained that her next appointment was wednesday and that would be the soonest we could see her. She verbalized understanding and asked what can she do for the pain, advice to get a heating pad and to get a large pillow and try to relax more and stretch. Advised to think about PT and to remember to come fasting on wednesday for her appointment she verbalized understanding and ended the call.

## 2019-06-26 ENCOUNTER — Ambulatory Visit: Payer: Medicaid Other

## 2019-06-27 ENCOUNTER — Other Ambulatory Visit: Payer: Self-pay

## 2019-06-27 ENCOUNTER — Other Ambulatory Visit: Payer: Medicaid Other

## 2019-06-27 ENCOUNTER — Ambulatory Visit (INDEPENDENT_AMBULATORY_CARE_PROVIDER_SITE_OTHER): Payer: Medicaid Other | Admitting: Family Medicine

## 2019-06-27 VITALS — BP 130/73 | HR 71 | Wt 137.8 lb

## 2019-06-27 DIAGNOSIS — O30032 Twin pregnancy, monochorionic/diamniotic, second trimester: Secondary | ICD-10-CM

## 2019-06-27 DIAGNOSIS — Z23 Encounter for immunization: Secondary | ICD-10-CM | POA: Diagnosis not present

## 2019-06-27 DIAGNOSIS — O99322 Drug use complicating pregnancy, second trimester: Secondary | ICD-10-CM

## 2019-06-27 DIAGNOSIS — O0990 Supervision of high risk pregnancy, unspecified, unspecified trimester: Secondary | ICD-10-CM

## 2019-06-27 DIAGNOSIS — O0992 Supervision of high risk pregnancy, unspecified, second trimester: Secondary | ICD-10-CM | POA: Diagnosis not present

## 2019-06-27 DIAGNOSIS — A6009 Herpesviral infection of other urogenital tract: Secondary | ICD-10-CM

## 2019-06-27 DIAGNOSIS — O99320 Drug use complicating pregnancy, unspecified trimester: Secondary | ICD-10-CM

## 2019-06-27 DIAGNOSIS — O98319 Other infections with a predominantly sexual mode of transmission complicating pregnancy, unspecified trimester: Secondary | ICD-10-CM

## 2019-06-27 DIAGNOSIS — O99891 Other specified diseases and conditions complicating pregnancy: Secondary | ICD-10-CM

## 2019-06-27 DIAGNOSIS — O09292 Supervision of pregnancy with other poor reproductive or obstetric history, second trimester: Secondary | ICD-10-CM

## 2019-06-27 DIAGNOSIS — O09299 Supervision of pregnancy with other poor reproductive or obstetric history, unspecified trimester: Secondary | ICD-10-CM

## 2019-06-27 DIAGNOSIS — O98312 Other infections with a predominantly sexual mode of transmission complicating pregnancy, second trimester: Secondary | ICD-10-CM

## 2019-06-27 DIAGNOSIS — Z283 Underimmunization status: Secondary | ICD-10-CM

## 2019-06-27 DIAGNOSIS — Z2839 Other underimmunization status: Secondary | ICD-10-CM

## 2019-06-27 DIAGNOSIS — F1120 Opioid dependence, uncomplicated: Secondary | ICD-10-CM

## 2019-06-27 DIAGNOSIS — Z3A27 27 weeks gestation of pregnancy: Secondary | ICD-10-CM

## 2019-06-27 DIAGNOSIS — G8921 Chronic pain due to trauma: Secondary | ICD-10-CM
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
## 2019-06-28 ENCOUNTER — Ambulatory Visit: Payer: Medicaid Other

## 2019-06-28 LAB — CBC
Hematocrit: 39.4 % (ref 34.0–46.6)
Hemoglobin: 13.4 g/dL (ref 11.1–15.9)
MCH: 33.7 pg — ABNORMAL HIGH (ref 26.6–33.0)
MCHC: 34 g/dL (ref 31.5–35.7)
MCV: 99 fL — ABNORMAL HIGH (ref 79–97)
Platelets: 137 10*3/uL — ABNORMAL LOW (ref 150–450)
RBC: 3.98 x10E6/uL (ref 3.77–5.28)
RDW: 12.3 % (ref 11.7–15.4)
WBC: 8.9 10*3/uL (ref 3.4–10.8)

## 2019-06-28 LAB — HIV ANTIBODY (ROUTINE TESTING W REFLEX): HIV Screen 4th Generation wRfx: NONREACTIVE

## 2019-06-28 LAB — GLUCOSE TOLERANCE, 2 HOURS W/ 1HR
Glucose, 1 hour: 103 mg/dL (ref 65–179)
Glucose, 2 hour: 42 mg/dL — ABNORMAL LOW (ref 65–152)
Glucose, Fasting: 92 mg/dL — ABNORMAL HIGH (ref 65–91)

## 2019-06-28 LAB — RPR: RPR Ser Ql: NONREACTIVE

## 2019-06-29 MED ORDER — ACCU-CHEK SMARTVIEW VI STRP: ORAL_STRIP | 12 refills | Status: DC

## 2019-06-29 MED ORDER — ACCU-CHEK SOFTCLIX LANCETS MISC
1.0000 | Freq: Every day | 12 refills | Status: DC
Start: 2019-06-29 — End: 2019-09-04

## 2019-06-29 MED ORDER — ACCU-CHEK NANO SMARTVIEW W/DEVICE KIT: 1.0000 | PACK | 0 refills | Status: DC

## 2019-06-29 NOTE — Progress Notes (Signed)
Fasting blood sugar minimally elevated, but all values are quite low. Patient didn't eat anything that morning. Will have her check fasting blood sugars over next 2 weeks. Meter and testing supplies sent to pharmacy.

## 2019-07-02 ENCOUNTER — Other Ambulatory Visit: Payer: Self-pay | Admitting: *Deleted

## 2019-07-02 ENCOUNTER — Ambulatory Visit: Payer: Medicaid Other | Attending: Obstetrics and Gynecology

## 2019-07-02 ENCOUNTER — Other Ambulatory Visit: Payer: Self-pay

## 2019-07-02 ENCOUNTER — Ambulatory Visit: Payer: Medicaid Other | Admitting: *Deleted

## 2019-07-02 DIAGNOSIS — O30039 Twin pregnancy, monochorionic/diamniotic, unspecified trimester: Secondary | ICD-10-CM | POA: Diagnosis present

## 2019-07-02 DIAGNOSIS — O210 Mild hyperemesis gravidarum: Secondary | ICD-10-CM | POA: Diagnosis present

## 2019-07-02 DIAGNOSIS — F1120 Opioid dependence, uncomplicated: Secondary | ICD-10-CM

## 2019-07-02 DIAGNOSIS — O99323 Drug use complicating pregnancy, third trimester: Secondary | ICD-10-CM

## 2019-07-02 DIAGNOSIS — O30033 Twin pregnancy, monochorionic/diamniotic, third trimester: Secondary | ICD-10-CM

## 2019-07-02 DIAGNOSIS — Z79891 Long term (current) use of opiate analgesic: Secondary | ICD-10-CM

## 2019-07-02 DIAGNOSIS — Z3A28 28 weeks gestation of pregnancy: Secondary | ICD-10-CM

## 2019-07-02 DIAGNOSIS — Z362 Encounter for other antenatal screening follow-up: Secondary | ICD-10-CM

## 2019-07-02 DIAGNOSIS — O09293 Supervision of pregnancy with other poor reproductive or obstetric history, third trimester: Secondary | ICD-10-CM | POA: Diagnosis not present

## 2019-07-02 DIAGNOSIS — O0990 Supervision of high risk pregnancy, unspecified, unspecified trimester: Secondary | ICD-10-CM

## 2019-07-02 DIAGNOSIS — O321XX2 Maternal care for breech presentation, fetus 2: Secondary | ICD-10-CM

## 2019-07-02 DIAGNOSIS — Z8759 Personal history of other complications of pregnancy, childbirth and the puerperium: Secondary | ICD-10-CM

## 2019-07-12 ENCOUNTER — Other Ambulatory Visit: Payer: Self-pay | Admitting: Obstetrics and Gynecology

## 2019-07-12 ENCOUNTER — Ambulatory Visit: Payer: Medicaid Other | Attending: Obstetrics and Gynecology

## 2019-07-12 ENCOUNTER — Ambulatory Visit: Payer: Medicaid Other | Admitting: *Deleted

## 2019-07-12 ENCOUNTER — Other Ambulatory Visit: Payer: Self-pay

## 2019-07-12 DIAGNOSIS — O210 Mild hyperemesis gravidarum: Secondary | ICD-10-CM | POA: Insufficient documentation

## 2019-07-12 DIAGNOSIS — O99323 Drug use complicating pregnancy, third trimester: Secondary | ICD-10-CM

## 2019-07-12 DIAGNOSIS — O30039 Twin pregnancy, monochorionic/diamniotic, unspecified trimester: Secondary | ICD-10-CM | POA: Diagnosis present

## 2019-07-12 DIAGNOSIS — O0990 Supervision of high risk pregnancy, unspecified, unspecified trimester: Secondary | ICD-10-CM | POA: Insufficient documentation

## 2019-07-12 DIAGNOSIS — O30033 Twin pregnancy, monochorionic/diamniotic, third trimester: Secondary | ICD-10-CM

## 2019-07-12 DIAGNOSIS — O09293 Supervision of pregnancy with other poor reproductive or obstetric history, third trimester: Secondary | ICD-10-CM | POA: Diagnosis not present

## 2019-07-12 DIAGNOSIS — Z3A29 29 weeks gestation of pregnancy: Secondary | ICD-10-CM

## 2019-07-12 DIAGNOSIS — Z362 Encounter for other antenatal screening follow-up: Secondary | ICD-10-CM

## 2019-07-12 DIAGNOSIS — F1120 Opioid dependence, uncomplicated: Secondary | ICD-10-CM

## 2019-07-12 DIAGNOSIS — Z79891 Long term (current) use of opiate analgesic: Secondary | ICD-10-CM

## 2019-07-16 ENCOUNTER — Other Ambulatory Visit: Payer: Self-pay

## 2019-07-16 ENCOUNTER — Encounter: Payer: Self-pay | Admitting: Medical

## 2019-07-16 ENCOUNTER — Ambulatory Visit (INDEPENDENT_AMBULATORY_CARE_PROVIDER_SITE_OTHER): Payer: Medicaid Other | Admitting: Medical

## 2019-07-16 VITALS — BP 127/83 | HR 90

## 2019-07-16 DIAGNOSIS — Z8759 Personal history of other complications of pregnancy, childbirth and the puerperium: Secondary | ICD-10-CM

## 2019-07-16 DIAGNOSIS — O99513 Diseases of the respiratory system complicating pregnancy, third trimester: Secondary | ICD-10-CM

## 2019-07-16 DIAGNOSIS — O30033 Twin pregnancy, monochorionic/diamniotic, third trimester: Secondary | ICD-10-CM

## 2019-07-16 DIAGNOSIS — J4520 Mild intermittent asthma, uncomplicated: Secondary | ICD-10-CM

## 2019-07-16 DIAGNOSIS — R7989 Other specified abnormal findings of blood chemistry: Secondary | ICD-10-CM

## 2019-07-16 DIAGNOSIS — Z283 Underimmunization status: Secondary | ICD-10-CM

## 2019-07-16 DIAGNOSIS — Z2839 Other underimmunization status: Secondary | ICD-10-CM

## 2019-07-16 DIAGNOSIS — O98313 Other infections with a predominantly sexual mode of transmission complicating pregnancy, third trimester: Secondary | ICD-10-CM

## 2019-07-16 DIAGNOSIS — R12 Heartburn: Secondary | ICD-10-CM

## 2019-07-16 DIAGNOSIS — O99323 Drug use complicating pregnancy, third trimester: Secondary | ICD-10-CM

## 2019-07-16 DIAGNOSIS — O0990 Supervision of high risk pregnancy, unspecified, unspecified trimester: Secondary | ICD-10-CM

## 2019-07-16 DIAGNOSIS — O99320 Drug use complicating pregnancy, unspecified trimester: Secondary | ICD-10-CM

## 2019-07-16 DIAGNOSIS — O0993 Supervision of high risk pregnancy, unspecified, third trimester: Secondary | ICD-10-CM

## 2019-07-16 DIAGNOSIS — O99891 Other specified diseases and conditions complicating pregnancy: Secondary | ICD-10-CM

## 2019-07-16 DIAGNOSIS — O09299 Supervision of pregnancy with other poor reproductive or obstetric history, unspecified trimester: Secondary | ICD-10-CM

## 2019-07-16 DIAGNOSIS — O26893 Other specified pregnancy related conditions, third trimester: Secondary | ICD-10-CM

## 2019-07-16 DIAGNOSIS — A6004 Herpesviral vulvovaginitis: Secondary | ICD-10-CM

## 2019-07-16 DIAGNOSIS — G8921 Chronic pain due to trauma: Secondary | ICD-10-CM

## 2019-07-16 DIAGNOSIS — Z3A30 30 weeks gestation of pregnancy: Secondary | ICD-10-CM

## 2019-07-16 DIAGNOSIS — F1120 Opioid dependence, uncomplicated: Secondary | ICD-10-CM

## 2019-07-16 MED ORDER — OMEPRAZOLE 20 MG PO CPDR: 20.0000 mg | DELAYED_RELEASE_CAPSULE | Freq: Every day | ORAL | 0 refills | Status: DC

## 2019-07-16 MED ORDER — COMFORT FIT MATERNITY SUPP MED MISC: 1.0000 [IU] | Freq: Every day | 0 refills | Status: DC

## 2019-07-16 MED ORDER — CYCLOBENZAPRINE HCL 10 MG PO TABS: 10.0000 mg | ORAL_TABLET | Freq: Every day | ORAL | 0 refills | Status: DC

## 2019-07-16 NOTE — Progress Notes (Signed)
PRENATAL VISIT NOTE  Subjective:  Beth Carr is a 24 y.o. G3P1011 at 11w2dbeing seen today for ongoing prenatal care.  She is currently monitored for the following issues for this high-risk pregnancy and has Hx of preeclampsia, prior pregnancy, currently pregnant; Chronic pain; Supervision of high risk pregnancy, antepartum; Genital herpes; Mild intermittent asthma; Pregnancy complicated by subutex maintenance, antepartum ([place]; Rubella non-immune status, antepartum; Low TSH level; and Monochorionic diamniotic twin gestation on their problem list.  Patient reports backache, heartburn and difficulty sleeping.  Contractions: Irritability. Vag. Bleeding: None.  Movement: Present. Denies leaking of fluid.   The following portions of the patient's history were reviewed and updated as appropriate: allergies, current medications, past family history, past medical history, past social history, past surgical history and problem list.   Objective:   Vitals:   07/16/19 0943  BP: 127/83  Pulse: 90    Fetal Status: Fetal Heart Rate (bpm): 155/147   Movement: Present     General:  Alert, oriented and cooperative. Patient is in no acute distress.  Skin: Skin is warm and dry. No rash noted.   Cardiovascular: Normal heart rate noted  Respiratory: Normal respiratory effort, no problems with respiration noted  Abdomen: Soft, gravid, appropriate for gestational age.  Pain/Pressure: Absent     Pelvic: Cervical exam deferred        Extremities: Normal range of motion.  Edema: None  Mental Status: Normal mood and affect. Normal behavior. Normal judgment and thought content.   Assessment and Plan:  Pregnancy: G3P1011 at 360w2d. Supervision of high risk pregnancy, antepartum  2. Pregnancy complicated by subutex maintenance, antepartum ([place]- Patient states she is no longer on subutex  3. Monochorionic diamniotic twin gestation in third trimester - Has weekly BPP and serial growth scans  scheduled in MFM starting at 32 weeks  - Normal Fetal Echo 06/07/19  4. Rubella non-immune status, antepartum - Needs PP MMR  5. Low TSH level - TSH - T4, free  6. Hx of preeclampsia, prior pregnancy, currently pregnant - Normotensive today   7. Chronic pain due to trauma - cyclobenzaprine (FLEXERIL) 10 MG tablet; Take 1 tablet (10 mg total) by mouth at bedtime.  Dispense: 20 tablet; Refill: 0 - Elastic Bandages & Supports (COMFORT FIT MATERNITY SUPP MED) MISC; 1 Units by Does not apply route daily.  Dispense: 1 each; Refill: 0 - Encouraged to continue Tylenol PRN for pain  - Hydrotherapy and heat therapy Discussed   8. Mild intermittent asthma without complication - Has not needed inhaler recently   9. Herpes simplex vulvovaginitis - PPX at 35 weeks   10. Heartburn in pregnancy in third trimester - omeprazole (PRILOSEC) 20 MG capsule; Take 1 capsule (20 mg total) by mouth daily.  Dispense: 90 capsule; Refill: 0  Preterm labor symptoms and general obstetric precautions including but not limited to vaginal bleeding, contractions, leaking of fluid and fetal movement were reviewed in detail with the patient. Please refer to After Visit Summary for other counseling recommendations.   Return in about 2 weeks (around 07/30/2019) for [facility], In-Person.  Future Appointments  Date Time Provider [place]val[place] Est[place]7/22/2021  2:30 PM WMNa[facility]URSE WMAmbulatory [facility] LLCMGoldstep [facility]7/22/2021  2:30 PM WMC-MFC US3 WMC-MFCUS WMHca [facility]7/26/2021  8:15 AM WMC-MFC NURSE WMC-MFC WMTeton [facility] Care7/26/2021  8:30 AM WMC-MFC US3 WMC-MFCUS WMSuncoast [facility]oscopy [facility] LLC7/26/2021  9:15 AM PrDonnamae JudeMD WMRangely District HospitalMPalm [facility]oscopy [facility]8/02/2019  8:15 AM WMC-MFC NURSE WMC-MFC WMNorthwest Med Center8/02/2019  8:30 AM WMC-MFC US3 WMC-MFCUS [facility]  08/13/2019  9:35 AM Truett Mainland, DO [facility]    Kerry Hough, PA-C

## 2019-07-16 NOTE — Patient Instructions (Addendum)
Back Pain in Pregnancy Back pain during pregnancy is common. Back pain may be caused by several factors that are related to changes during your pregnancy. Follow these instructions at home: Managing pain, stiffness, and swelling      If directed, for sudden (acute) back pain, put ice on the painful area. ? Put ice in a plastic bag. ? Place a towel between your skin and the bag. ? Leave the ice on for 20 minutes, 2-3 times per day.  If directed, apply heat to the affected area before you exercise. Use the heat source that your health care provider recommends, such as a moist heat pack or a heating pad. ? Place a towel between your skin and the heat source. ? Leave the heat on for 20-30 minutes. ? Remove the heat if your skin turns bright red. This is especially important if you are unable to feel pain, heat, or cold. You may have a greater risk of getting burned.  If directed, massage the affected area. Activity  Exercise as told by your health care provider. Gentle exercise is the best way to prevent or manage back pain.  Listen to your body when lifting. If lifting hurts, ask for help or bend your knees. This uses your leg muscles instead of your back muscles.  Squat down when picking up something from the floor. Do not bend over.  Only use bed rest for short periods as told by your health care provider. Bed rest should only be used for the most severe episodes of back pain. Standing, sitting, and lying down  Do not stand in one place for long periods of time.  Use good posture when sitting. Make sure your head rests over your shoulders and is not hanging forward. Use a pillow on your lower back if necessary.  Try sleeping on your side, preferably the left side, with a pregnancy support pillow or 1-2 regular pillows between your legs. ? If you have back pain after a night's rest, your bed may be too soft. ? A firm mattress may provide more support for your back during  pregnancy. General instructions  Do not wear high heels.  Eat a healthy diet. Try to gain weight within your health care provider's recommendations.  Use a maternity girdle, elastic sling, or back brace as told by your health care provider.  Take over-the-counter and prescription medicines only as told by your health care provider.  Work with a physical therapist or massage therapist to find ways to manage back pain. Acupuncture or massage therapy may be helpful.  Keep all follow-up visits as told by your health care provider. This is important. Contact a health care provider if:  Your back pain interferes with your daily activities.  You have increasing pain in other parts of your body. Get help right away if:  You develop numbness, tingling, weakness, or problems with the use of your arms or legs.  You develop severe back pain that is not controlled with medicine.  You have a change in bowel or bladder control.  You develop shortness of breath, dizziness, or you faint.  You develop nausea, vomiting, or sweating.  You have back pain that is a rhythmic, cramping pain similar to labor pains. Labor pain is usually 1-2 minutes apart, lasts for about 1 minute, and involves a bearing down feeling or pressure in your pelvis.  You have back pain and your water breaks or you have vaginal bleeding.  You have back pain or numbness  that travels down your leg.  Your back pain developed after you fell.  You develop pain on one side of your back.  You see blood in your urine.  You develop skin blisters in the area of your back pain. Summary  Back pain may be caused by several factors that are related to changes during your pregnancy.  Follow instructions as told by your health care provider for managing pain, stiffness, and swelling.  Exercise as told by your health care provider. Gentle exercise is the best way to prevent or manage back pain.  Take over-the-counter and  prescription medicines only as told by your health care provider.  Keep all follow-up visits as told by your health care provider. This is important. This information is not intended to replace advice given to you by your health care provider. Make sure you discuss any questions you have with your health care provider. Document Revised: 04/11/2018 Document Reviewed: 06/08/2017 Elsevier Patient Education  2020 Elsevier Inc. Fetal Movement Counts Patient Name: ________________________________________________ Patient Due Date: ____________________ What is a fetal movement count?  A fetal movement count is the number of times that you feel your baby move during a certain amount of time. This may also be called a fetal kick count. A fetal movement count is recommended for every pregnant woman. You may be asked to start counting fetal movements as early as week 28 of your pregnancy. Pay attention to when your baby is most active. You may notice your baby's sleep and wake cycles. You may also notice things that make your baby move more. You should do a fetal movement count:  When your baby is normally most active.  At the same time each day. A good time to count movements is while you are resting, after having something to eat and drink. How do I count fetal movements? 1. Find a quiet, comfortable area. Sit, or lie down on your side. 2. Write down the date, the start time and stop time, and the number of movements that you felt between those two times. Take this information with you to your health care visits. 3. Write down your start time when you feel the first movement. 4. Count kicks, flutters, swishes, rolls, and jabs. You should feel at least 10 movements. 5. You may stop counting after you have felt 10 movements, or if you have been counting for 2 hours. Write down the stop time. 6. If you do not feel 10 movements in 2 hours, contact your health care provider for further instructions. Your  health care provider may want to do additional tests to assess your baby's well-being. Contact a health care provider if:  You feel fewer than 10 movements in 2 hours.  Your baby is not moving like he or she usually does. Date: ____________ Start time: ____________ Stop time: ____________ Movements: ____________ Date: ____________ Start time: ____________ Stop time: ____________ Movements: ____________ Date: ____________ Start time: ____________ Stop time: ____________ Movements: ____________ Date: ____________ Start time: ____________ Stop time: ____________ Movements: ____________ Date: ____________ Start time: ____________ Stop time: ____________ Movements: ____________ Date: ____________ Start time: ____________ Stop time: ____________ Movements: ____________ Date: ____________ Start time: ____________ Stop time: ____________ Movements: ____________ Date: ____________ Start time: ____________ Stop time: ____________ Movements: ____________ Date: ____________ Start time: ____________ Stop time: ____________ Movements: ____________ This information is not intended to replace advice given to you by your health care provider. Make sure you discuss any questions you have with your health care provider. Document Revised:  08/10/2018 Document Reviewed: 08/10/2018 Elsevier Patient Education  2020 [employer] Inc[employer] of the uterus can occur throughout pregnancy, but they are not always a sign that you are in labor. You may have practice contractions called Braxton Hicks contractions. These false labor contractions are sometimes confused with true labor. What are Deberah Pelton contractions? Braxton Hicks contractions are tightening movements that occur in the muscles of the uterus before labor. Unlike true labor contractions, these contractions do not result in opening (dilation) and thinning of the cervix. Toward the end of pregnancy (32-34 weeks), Braxton Hicks  contractions can happen more often and may become stronger. These contractions are sometimes difficult to tell apart from true labor because they can be very uncomfortable. You should not feel embarrassed if you go to the hospital with false labor. Sometimes, the only way to tell if you are in true labor is for your health care provider to look for changes in the cervix. The health care provider will do a physical exam and may monitor your contractions. If you are not in true labor, the exam should show that your cervix is not dilating and your water has not broken. If there are no other health problems associated with your pregnancy, it is completely safe for you to be sent home with false labor. You may continue to have Braxton Hicks contractions until you go into true labor. How to tell the difference between true labor and false labor True labor  Contractions last 30-70 seconds.  Contractions become very regular.  Discomfort is usually felt in the top of the uterus, and it spreads to the lower abdomen and low back.  Contractions do not go away with walking.  Contractions usually become more intense and increase in frequency.  The cervix dilates and gets thinner. False labor  Contractions are usually shorter and not as strong as true labor contractions.  Contractions are usually irregular.  Contractions are often felt in the front of the lower abdomen and in the groin.  Contractions may go away when you walk around or change positions while lying down.  Contractions get weaker and are shorter-lasting as time goes on.  The cervix usually does not dilate or become thin. Follow these instructions at home:   Take over-the-counter and prescription medicines only as told by your health care provider.  Keep up with your usual exercises and follow other instructions from your health care provider.  Eat and drink lightly if you think you are going into labor.  If Braxton Hicks  contractions are making you uncomfortable: ? Change your position from lying down or resting to walking, or change from walking to resting. ? Sit and rest in a tub of warm water. ? Drink enough fluid to keep your urine pale yellow. Dehydration may cause these contractions. ? Do slow and deep breathing several times an hour.  Keep all follow-up prenatal visits as told by your health care provider. This is important. Contact a health care provider if:  You have a fever.  You have continuous pain in your abdomen. Get help right away if:  Your contractions become stronger, more regular, and closer together.  You have fluid leaking or gushing from your vagina.  You pass blood-tinged mucus (bloody show).  You have bleeding from your vagina.  You have low back pain that you never had before.  You feel your babys head pushing down and causing pelvic pressure.  Your baby is not moving inside you as much  as it used to. Summary  Contractions that occur before labor are called Braxton Hicks contractions, false labor, or practice contractions.  Braxton Hicks contractions are usually shorter, weaker, farther apart, and less regular than true labor contractions. True labor contractions usually become progressively stronger and regular, and they become more frequent.  Manage discomfort from [facility] contractions by changing position, resting in a warm bath, drinking plenty of water, or practicing deep breathing. This information is not intended to replace advice given to you by your health care provider. Make sure you discuss any questions you have with your health care provider. Document Revised: 12/03/2016 Document Reviewed: 05/06/2016 Elsevier Patient Education  2020 [employer].  For belly band:  [facility]   [address] [place], [place], [place] [address] Phone: [phone]  Monday      8:30AM-5PM Tuesday 8:30AM-5PM Wednesday 8:30AM-5PM Thursday 8:30AM-5PM Friday  8:30AM-5PM Saturday Closed Sunday Closed

## 2019-07-17 LAB — TSH: TSH: 0.971 u[IU]/mL (ref 0.450–4.500)

## 2019-07-17 LAB — T4, FREE: Free T4: 1.27 ng/dL (ref 0.82–1.77)

## 2019-07-26 ENCOUNTER — Ambulatory Visit: Payer: Medicaid Other | Attending: Obstetrics and Gynecology

## 2019-07-26 ENCOUNTER — Other Ambulatory Visit: Payer: Self-pay | Admitting: Obstetrics and Gynecology

## 2019-07-26 ENCOUNTER — Other Ambulatory Visit: Payer: Self-pay

## 2019-07-26 ENCOUNTER — Other Ambulatory Visit: Payer: Self-pay | Admitting: *Deleted

## 2019-07-26 ENCOUNTER — Ambulatory Visit: Payer: Medicaid Other | Admitting: *Deleted

## 2019-07-26 DIAGNOSIS — O30033 Twin pregnancy, monochorionic/diamniotic, third trimester: Secondary | ICD-10-CM | POA: Diagnosis not present

## 2019-07-26 DIAGNOSIS — J4520 Mild intermittent asthma, uncomplicated: Secondary | ICD-10-CM | POA: Insufficient documentation

## 2019-07-26 DIAGNOSIS — O30039 Twin pregnancy, monochorionic/diamniotic, unspecified trimester: Secondary | ICD-10-CM | POA: Diagnosis present

## 2019-07-26 DIAGNOSIS — A6004 Herpesviral vulvovaginitis: Secondary | ICD-10-CM

## 2019-07-26 DIAGNOSIS — Z79891 Long term (current) use of opiate analgesic: Secondary | ICD-10-CM

## 2019-07-26 DIAGNOSIS — O365931 Maternal care for other known or suspected poor fetal growth, third trimester, fetus 1: Secondary | ICD-10-CM

## 2019-07-26 DIAGNOSIS — Z3A31 31 weeks gestation of pregnancy: Secondary | ICD-10-CM

## 2019-07-26 DIAGNOSIS — O0990 Supervision of high risk pregnancy, unspecified, unspecified trimester: Secondary | ICD-10-CM | POA: Insufficient documentation

## 2019-07-26 DIAGNOSIS — O99323 Drug use complicating pregnancy, third trimester: Secondary | ICD-10-CM

## 2019-07-26 DIAGNOSIS — O09293 Supervision of pregnancy with other poor reproductive or obstetric history, third trimester: Secondary | ICD-10-CM

## 2019-07-26 DIAGNOSIS — F1120 Opioid dependence, uncomplicated: Secondary | ICD-10-CM | POA: Diagnosis not present

## 2019-07-26 DIAGNOSIS — O321XX2 Maternal care for breech presentation, fetus 2: Secondary | ICD-10-CM

## 2019-07-26 DIAGNOSIS — O322XX1 Maternal care for transverse and oblique lie, fetus 1: Secondary | ICD-10-CM

## 2019-07-30 ENCOUNTER — Ambulatory Visit (INDEPENDENT_AMBULATORY_CARE_PROVIDER_SITE_OTHER): Payer: Medicaid Other | Admitting: Family Medicine

## 2019-07-30 ENCOUNTER — Encounter: Payer: Self-pay | Admitting: Family Medicine

## 2019-07-30 ENCOUNTER — Ambulatory Visit: Payer: Medicaid Other | Admitting: *Deleted

## 2019-07-30 ENCOUNTER — Other Ambulatory Visit: Payer: Self-pay | Admitting: *Deleted

## 2019-07-30 ENCOUNTER — Ambulatory Visit: Payer: Medicaid Other | Attending: Obstetrics and Gynecology

## 2019-07-30 ENCOUNTER — Other Ambulatory Visit: Payer: Self-pay

## 2019-07-30 VITALS — BP 122/77 | HR 74 | Wt 143.8 lb

## 2019-07-30 DIAGNOSIS — O0990 Supervision of high risk pregnancy, unspecified, unspecified trimester: Secondary | ICD-10-CM | POA: Insufficient documentation

## 2019-07-30 DIAGNOSIS — O09293 Supervision of pregnancy with other poor reproductive or obstetric history, third trimester: Secondary | ICD-10-CM | POA: Diagnosis not present

## 2019-07-30 DIAGNOSIS — O365932 Maternal care for other known or suspected poor fetal growth, third trimester, fetus 2: Secondary | ICD-10-CM

## 2019-07-30 DIAGNOSIS — O0993 Supervision of high risk pregnancy, unspecified, third trimester: Secondary | ICD-10-CM

## 2019-07-30 DIAGNOSIS — O365931 Maternal care for other known or suspected poor fetal growth, third trimester, fetus 1: Secondary | ICD-10-CM | POA: Diagnosis not present

## 2019-07-30 DIAGNOSIS — O09899 Supervision of other high risk pregnancies, unspecified trimester: Secondary | ICD-10-CM

## 2019-07-30 DIAGNOSIS — O09299 Supervision of pregnancy with other poor reproductive or obstetric history, unspecified trimester: Secondary | ICD-10-CM | POA: Diagnosis not present

## 2019-07-30 DIAGNOSIS — O99320 Drug use complicating pregnancy, unspecified trimester: Secondary | ICD-10-CM

## 2019-07-30 DIAGNOSIS — Z2839 Other underimmunization status: Secondary | ICD-10-CM

## 2019-07-30 DIAGNOSIS — O99323 Drug use complicating pregnancy, third trimester: Secondary | ICD-10-CM

## 2019-07-30 DIAGNOSIS — Z3A32 32 weeks gestation of pregnancy: Secondary | ICD-10-CM

## 2019-07-30 DIAGNOSIS — O30033 Twin pregnancy, monochorionic/diamniotic, third trimester: Secondary | ICD-10-CM | POA: Diagnosis not present

## 2019-07-30 DIAGNOSIS — Z283 Underimmunization status: Secondary | ICD-10-CM

## 2019-07-30 DIAGNOSIS — F1120 Opioid dependence, uncomplicated: Secondary | ICD-10-CM | POA: Diagnosis not present

## 2019-07-30 DIAGNOSIS — O98313 Other infections with a predominantly sexual mode of transmission complicating pregnancy, third trimester: Secondary | ICD-10-CM

## 2019-07-30 DIAGNOSIS — A6004 Herpesviral vulvovaginitis: Secondary | ICD-10-CM

## 2019-07-30 DIAGNOSIS — Z79891 Long term (current) use of opiate analgesic: Secondary | ICD-10-CM

## 2019-07-30 DIAGNOSIS — O321XX2 Maternal care for breech presentation, fetus 2: Secondary | ICD-10-CM

## 2019-07-30 DIAGNOSIS — O30039 Twin pregnancy, monochorionic/diamniotic, unspecified trimester: Secondary | ICD-10-CM | POA: Diagnosis present

## 2019-07-30 DIAGNOSIS — J4520 Mild intermittent asthma, uncomplicated: Secondary | ICD-10-CM

## 2019-07-30 DIAGNOSIS — O99513 Diseases of the respiratory system complicating pregnancy, third trimester: Secondary | ICD-10-CM

## 2019-07-30 DIAGNOSIS — O24410 Gestational diabetes mellitus in pregnancy, diet controlled: Secondary | ICD-10-CM

## 2019-07-30 DIAGNOSIS — Z8759 Personal history of other complications of pregnancy, childbirth and the puerperium: Secondary | ICD-10-CM

## 2019-07-30 DIAGNOSIS — O24419 Gestational diabetes mellitus in pregnancy, unspecified control: Secondary | ICD-10-CM | POA: Insufficient documentation

## 2019-07-30 LAB — POCT URINALYSIS DIP (DEVICE)
Bilirubin Urine: NEGATIVE
Glucose, UA: NEGATIVE mg/dL
Hgb urine dipstick: NEGATIVE
Ketones, ur: NEGATIVE mg/dL
Nitrite: NEGATIVE
Protein, ur: NEGATIVE mg/dL
Specific Gravity, Urine: 1.025 (ref 1.005–1.030)
Urobilinogen, UA: 0.2 mg/dL (ref 0.0–1.0)
pH: 7 (ref 5.0–8.0)

## 2019-07-30 NOTE — Patient Instructions (Signed)

## 2019-07-31 NOTE — Progress Notes (Signed)
   PRENATAL VISIT NOTE  Subjective:  Beth Carr is a 24 y.o. G3P1011 at 5w3dbeing seen today for ongoing prenatal care.  She is currently monitored for the following issues for this low-risk pregnancy and has Hx of preeclampsia, prior pregnancy, currently pregnant; Chronic pain; Supervision of high risk pregnancy, antepartum; Genital herpes; Mild intermittent asthma; Pregnancy complicated by subutex maintenance, antepartum (HBrecon; Rubella non-immune status, antepartum; Low TSH level; Monochorionic diamniotic twin gestation; and Gestational diabetes on their problem list.  Patient reports no complaints.  Contractions: Irregular. Vag. Bleeding: None.  Movement: Present. Denies leaking of fluid.   The following portions of the patient's history were reviewed and updated as appropriate: allergies, current medications, past family history, past medical history, past social history, past surgical history and problem list.   Objective:   Vitals:   07/30/19 1024  BP: 122/77  Pulse: 74  Weight: 143 lb 12.8 oz (65.2 kg)    Fetal Status: Fetal Heart Rate (bpm): 145/136   Movement: Present     General:  Alert, oriented and cooperative. Patient is in no acute distress.  Skin: Skin is warm and dry. No rash noted.   Cardiovascular: Normal heart rate noted  Respiratory: Normal respiratory effort, no problems with respiration noted  Abdomen: Soft, gravid, appropriate for gestational age.  Pain/Pressure: Present     Pelvic: Cervical exam deferred        Extremities: Normal range of motion.  Edema: None  Mental Status: Normal mood and affect. Normal behavior. Normal judgment and thought content.   Assessment and Plan:  Pregnancy: G3P1011 at 389w3d. Herpes simplex vulvovaginitis Valtrex ppx at 35 wks  2. Monochorionic diamniotic twin gestation in third trimester IUGR of baby A A is vertex, discussed delivery plan F/u growth in 2 wks  3. Supervision of high risk pregnancy,  antepartum   4. Rubella non-immune status, antepartum Will need MMR pp  5. Hx of preeclampsia, prior pregnancy, currently pregnant BP is ok today  6. Diet controlled gestational diabetes mellitus (GDM) in third trimester Checking CBGs, all are WNL  Preterm labor symptoms and general obstetric precautions including but not limited to vaginal bleeding, contractions, leaking of fluid and fetal movement were reviewed in detail with the patient. Please refer to After Visit Summary for other counseling recommendations.   Return in 2 weeks (on 08/13/2019) [facility] Future [facility]  Date Time Provider DeShelby8/02/2019  8:15 AM [facility] NURSE [facility]  8:30 AM [facility]/09/2019  8:15 AM [facility]URSE [facility]09/2019  8:30 AM [facility] US[facility]/09/2019  9:35 AM StTruett MainlandDO [facility]8/16/2021  8:00 AM [facility] NURSE [facility]16[facility]20[facility]  8:15 AM [facility] US[facility]/23/2021  9:15 AM [facility] NURSE [facility]  9:30 AM [facility] US3 [facility] WMC    TaDonnamae JudeMD

## 2019-08-06 ENCOUNTER — Ambulatory Visit: Payer: Medicaid Other | Admitting: *Deleted

## 2019-08-06 ENCOUNTER — Encounter: Payer: Self-pay | Admitting: *Deleted

## 2019-08-06 ENCOUNTER — Other Ambulatory Visit: Payer: Self-pay

## 2019-08-06 ENCOUNTER — Ambulatory Visit: Payer: Medicaid Other | Attending: Obstetrics and Gynecology

## 2019-08-06 DIAGNOSIS — O0990 Supervision of high risk pregnancy, unspecified, unspecified trimester: Secondary | ICD-10-CM | POA: Diagnosis present

## 2019-08-06 DIAGNOSIS — O30033 Twin pregnancy, monochorionic/diamniotic, third trimester: Secondary | ICD-10-CM | POA: Diagnosis not present

## 2019-08-06 DIAGNOSIS — O365931 Maternal care for other known or suspected poor fetal growth, third trimester, fetus 1: Secondary | ICD-10-CM

## 2019-08-06 DIAGNOSIS — O322XX2 Maternal care for transverse and oblique lie, fetus 2: Secondary | ICD-10-CM

## 2019-08-06 DIAGNOSIS — O30039 Twin pregnancy, monochorionic/diamniotic, unspecified trimester: Secondary | ICD-10-CM | POA: Diagnosis present

## 2019-08-06 DIAGNOSIS — O09293 Supervision of pregnancy with other poor reproductive or obstetric history, third trimester: Secondary | ICD-10-CM | POA: Diagnosis not present

## 2019-08-06 DIAGNOSIS — Z3A33 33 weeks gestation of pregnancy: Secondary | ICD-10-CM

## 2019-08-06 DIAGNOSIS — O99323 Drug use complicating pregnancy, third trimester: Secondary | ICD-10-CM | POA: Diagnosis not present

## 2019-08-06 DIAGNOSIS — Z79891 Long term (current) use of opiate analgesic: Secondary | ICD-10-CM

## 2019-08-06 DIAGNOSIS — F1120 Opioid dependence, uncomplicated: Secondary | ICD-10-CM

## 2019-08-06 DIAGNOSIS — O365932 Maternal care for other known or suspected poor fetal growth, third trimester, fetus 2: Secondary | ICD-10-CM

## 2019-08-06 DIAGNOSIS — O321XX1 Maternal care for breech presentation, fetus 1: Secondary | ICD-10-CM

## 2019-08-13 ENCOUNTER — Ambulatory Visit: Payer: Medicaid Other | Admitting: *Deleted

## 2019-08-13 ENCOUNTER — Other Ambulatory Visit: Payer: Self-pay

## 2019-08-13 ENCOUNTER — Encounter: Payer: Self-pay | Admitting: *Deleted

## 2019-08-13 ENCOUNTER — Ambulatory Visit: Payer: Medicaid Other | Attending: Obstetrics and Gynecology

## 2019-08-13 ENCOUNTER — Encounter: Payer: Medicaid Other | Admitting: Family Medicine

## 2019-08-13 ENCOUNTER — Encounter: Payer: Self-pay | Admitting: Family Medicine

## 2019-08-13 DIAGNOSIS — O0990 Supervision of high risk pregnancy, unspecified, unspecified trimester: Secondary | ICD-10-CM

## 2019-08-13 DIAGNOSIS — O30033 Twin pregnancy, monochorionic/diamniotic, third trimester: Secondary | ICD-10-CM

## 2019-08-13 DIAGNOSIS — O99323 Drug use complicating pregnancy, third trimester: Secondary | ICD-10-CM

## 2019-08-13 DIAGNOSIS — Z79891 Long term (current) use of opiate analgesic: Secondary | ICD-10-CM

## 2019-08-13 DIAGNOSIS — F1120 Opioid dependence, uncomplicated: Secondary | ICD-10-CM

## 2019-08-13 DIAGNOSIS — Z3A34 34 weeks gestation of pregnancy: Secondary | ICD-10-CM

## 2019-08-13 DIAGNOSIS — O365932 Maternal care for other known or suspected poor fetal growth, third trimester, fetus 2: Secondary | ICD-10-CM | POA: Diagnosis not present

## 2019-08-13 DIAGNOSIS — O30039 Twin pregnancy, monochorionic/diamniotic, unspecified trimester: Secondary | ICD-10-CM | POA: Diagnosis present

## 2019-08-13 DIAGNOSIS — O09293 Supervision of pregnancy with other poor reproductive or obstetric history, third trimester: Secondary | ICD-10-CM

## 2019-08-20 ENCOUNTER — Telehealth: Payer: Self-pay

## 2019-08-20 ENCOUNTER — Ambulatory Visit: Payer: Medicaid Other

## 2019-08-20 NOTE — Telephone Encounter (Signed)
Pt called and stated that she has been calling and no one has picked up the phone.  Pt states that she missed her 0800 appt and has been vomiting.  Pt also states that she does not want to see a no show on her appt.  Per chart review, pt had an appt scheduled with MFM.  L/M for pt stating that her appt was not with her OB provider it was in [facility].  If she could please give them call at [phone].  If she has any questions to please give the office a call.    Addison Naegeli, RN  08/20/19

## 2019-08-24 ENCOUNTER — Ambulatory Visit: Payer: Medicaid Other | Admitting: *Deleted

## 2019-08-24 ENCOUNTER — Inpatient Hospital Stay (HOSPITAL_COMMUNITY): Payer: Medicaid Other | Admitting: Anesthesiology

## 2019-08-24 ENCOUNTER — Ambulatory Visit (HOSPITAL_BASED_OUTPATIENT_CLINIC_OR_DEPARTMENT_OTHER): Payer: Medicaid Other

## 2019-08-24 ENCOUNTER — Other Ambulatory Visit: Payer: Self-pay

## 2019-08-24 ENCOUNTER — Encounter (HOSPITAL_COMMUNITY)
Admission: AD | Disposition: A | Discharge status: Home / Self Care | Payer: Self-pay | Source: Home / Self Care | Attending: Obstetrics and Gynecology

## 2019-08-24 ENCOUNTER — Encounter (HOSPITAL_COMMUNITY): Payer: Self-pay | Admitting: Obstetrics and Gynecology

## 2019-08-24 ENCOUNTER — Inpatient Hospital Stay (HOSPITAL_COMMUNITY)
Admission: AD | Admit: 2019-08-24 | Discharge: 2019-08-27 | DRG: 787 | Disposition: A | Discharge status: Home / Self Care | Payer: Medicaid Other | Attending: Obstetrics and Gynecology | Admitting: Obstetrics and Gynecology

## 2019-08-24 DIAGNOSIS — O321XX1 Maternal care for breech presentation, fetus 1: Secondary | ICD-10-CM | POA: Diagnosis present

## 2019-08-24 DIAGNOSIS — Z20822 Contact with and (suspected) exposure to covid-19: Secondary | ICD-10-CM | POA: Diagnosis present

## 2019-08-24 DIAGNOSIS — Z3A35 35 weeks gestation of pregnancy: Secondary | ICD-10-CM

## 2019-08-24 DIAGNOSIS — O30033 Twin pregnancy, monochorionic/diamniotic, third trimester: Secondary | ICD-10-CM | POA: Diagnosis present

## 2019-08-24 DIAGNOSIS — O9832 Other infections with a predominantly sexual mode of transmission complicating childbirth: Secondary | ICD-10-CM | POA: Diagnosis present

## 2019-08-24 DIAGNOSIS — O99323 Drug use complicating pregnancy, third trimester: Secondary | ICD-10-CM | POA: Diagnosis not present

## 2019-08-24 DIAGNOSIS — O365990 Maternal care for other known or suspected poor fetal growth, unspecified trimester, not applicable or unspecified: Secondary | ICD-10-CM | POA: Diagnosis present

## 2019-08-24 DIAGNOSIS — O365933 Maternal care for other known or suspected poor fetal growth, third trimester, fetus 3: Secondary | ICD-10-CM | POA: Diagnosis not present

## 2019-08-24 DIAGNOSIS — Z30017 Encounter for initial prescription of implantable subdermal contraceptive: Secondary | ICD-10-CM

## 2019-08-24 DIAGNOSIS — O24420 Gestational diabetes mellitus in childbirth, diet controlled: Secondary | ICD-10-CM | POA: Diagnosis present

## 2019-08-24 DIAGNOSIS — O321XX2 Maternal care for breech presentation, fetus 2: Secondary | ICD-10-CM | POA: Diagnosis present

## 2019-08-24 DIAGNOSIS — O4103X2 Oligohydramnios, third trimester, fetus 2: Secondary | ICD-10-CM | POA: Diagnosis present

## 2019-08-24 DIAGNOSIS — F1120 Opioid dependence, uncomplicated: Secondary | ICD-10-CM | POA: Diagnosis not present

## 2019-08-24 DIAGNOSIS — O9952 Diseases of the respiratory system complicating childbirth: Secondary | ICD-10-CM | POA: Diagnosis present

## 2019-08-24 DIAGNOSIS — O30039 Twin pregnancy, monochorionic/diamniotic, unspecified trimester: Secondary | ICD-10-CM | POA: Insufficient documentation

## 2019-08-24 DIAGNOSIS — O365932 Maternal care for other known or suspected poor fetal growth, third trimester, fetus 2: Secondary | ICD-10-CM | POA: Diagnosis present

## 2019-08-24 DIAGNOSIS — O0990 Supervision of high risk pregnancy, unspecified, unspecified trimester: Secondary | ICD-10-CM | POA: Insufficient documentation

## 2019-08-24 DIAGNOSIS — O365931 Maternal care for other known or suspected poor fetal growth, third trimester, fetus 1: Secondary | ICD-10-CM | POA: Diagnosis not present

## 2019-08-24 DIAGNOSIS — Z79891 Long term (current) use of opiate analgesic: Secondary | ICD-10-CM

## 2019-08-24 DIAGNOSIS — A6000 Herpesviral infection of urogenital system, unspecified: Secondary | ICD-10-CM | POA: Diagnosis present

## 2019-08-24 DIAGNOSIS — J4520 Mild intermittent asthma, uncomplicated: Secondary | ICD-10-CM | POA: Diagnosis present

## 2019-08-24 DIAGNOSIS — O09293 Supervision of pregnancy with other poor reproductive or obstetric history, third trimester: Secondary | ICD-10-CM | POA: Diagnosis not present

## 2019-08-24 DIAGNOSIS — O365930 Maternal care for other known or suspected poor fetal growth, third trimester, not applicable or unspecified: Secondary | ICD-10-CM | POA: Diagnosis present

## 2019-08-24 LAB — GLUCOSE, CAPILLARY: Glucose-Capillary: 86 mg/dL (ref 70–99)

## 2019-08-24 LAB — CBC
HCT: 36.9 % (ref 36.0–46.0)
Hemoglobin: 12.6 g/dL (ref 12.0–15.0)
MCH: 32.6 pg (ref 26.0–34.0)
MCHC: 34.1 g/dL (ref 30.0–36.0)
MCV: 95.3 fL (ref 80.0–100.0)
Platelets: 150 10*3/uL (ref 150–400)
RBC: 3.87 MIL/uL (ref 3.87–5.11)
RDW: 12.7 % (ref 11.5–15.5)
WBC: 9.2 10*3/uL (ref 4.0–10.5)
nRBC: 0 % (ref 0.0–0.2)

## 2019-08-24 LAB — COMPREHENSIVE METABOLIC PANEL
ALT: 18 U/L (ref 0–44)
AST: 23 U/L (ref 15–41)
Albumin: 2.5 g/dL — ABNORMAL LOW (ref 3.5–5.0)
Alkaline Phosphatase: 298 U/L — ABNORMAL HIGH (ref 38–126)
Anion gap: 11 (ref 5–15)
BUN: 8 mg/dL (ref 6–20)
CO2: 19 mmol/L — ABNORMAL LOW (ref 22–32)
Calcium: 8.4 mg/dL — ABNORMAL LOW (ref 8.9–10.3)
Chloride: 108 mmol/L (ref 98–111)
Creatinine, Ser: 0.76 mg/dL (ref 0.44–1.00)
GFR calc Af Amer: >60 mL/min (ref >60)
GFR calc non Af Amer: >60 mL/min (ref >60)
Glucose, Bld: 105 mg/dL — ABNORMAL HIGH (ref 70–99)
Potassium: 3.9 mmol/L (ref 3.5–5.1)
Sodium: 138 mmol/L (ref 135–145)
Total Bilirubin: 0.6 mg/dL (ref 0.3–1.2)
Total Protein: 5.5 g/dL — ABNORMAL LOW (ref 6.5–8.1)

## 2019-08-24 LAB — TYPE AND SCREEN
ABO/RH(D): A POS
Antibody Screen: NEGATIVE

## 2019-08-24 LAB — SARS CORONAVIRUS 2 BY RT PCR (HOSPITAL ORDER, PERFORMED IN ~~LOC~~ HOSPITAL LAB): SARS Coronavirus 2: NEGATIVE

## 2019-08-24 SURGERY — Surgical Case
Anesthesia: Spinal | Site: Abdomen | Wound class: Clean Contaminated

## 2019-08-24 MED ORDER — HYDROMORPHONE HCL 1 MG/ML IJ SOLN
INTRAMUSCULAR | Status: AC
  Filled 2019-08-24: qty 1

## 2019-08-24 MED ORDER — LACTATED RINGERS IV SOLN: INTRAVENOUS | Status: DC

## 2019-08-24 MED ORDER — DOCUSATE SODIUM 100 MG PO CAPS
100.0000 mg | ORAL_CAPSULE | Freq: Every day | ORAL | Status: DC
  Administered 2019-08-24: 100 mg via ORAL
  Filled 2019-08-24: qty 1

## 2019-08-24 MED ORDER — PRENATAL MULTIVITAMIN CH
1.0000 | ORAL_TABLET | Freq: Every day | ORAL | Status: DC
  Administered 2019-08-25 – 2019-08-27 (×3): 1 via ORAL
  Filled 2019-08-24 (×3): qty 1

## 2019-08-24 MED ORDER — ZOLPIDEM TARTRATE 5 MG PO TABS: 5.0000 mg | ORAL_TABLET | Freq: Every evening | ORAL | Status: DC | PRN

## 2019-08-24 MED ORDER — SIMETHICONE 80 MG PO CHEW: 80.0000 mg | CHEWABLE_TABLET | ORAL | Status: DC | PRN

## 2019-08-24 MED ORDER — OXYCODONE HCL 5 MG/5ML PO SOLN
5.0000 mg | Freq: Once | ORAL | Status: AC
  Administered 2019-08-24: 5 mg via ORAL

## 2019-08-24 MED ORDER — DIPHENHYDRAMINE HCL 12.5 MG/5ML PO ELIX
12.5000 mg | ORAL_SOLUTION | Freq: Four times a day (QID) | ORAL | Status: DC | PRN
  Filled 2019-08-24: qty 5

## 2019-08-24 MED ORDER — STERILE WATER FOR IRRIGATION IR SOLN
Status: DC | PRN
  Administered 2019-08-24: 1000 mL

## 2019-08-24 MED ORDER — MORPHINE SULFATE (PF) 0.5 MG/ML IJ SOLN
INTRAMUSCULAR | Status: DC | PRN
Start: 2019-08-24 — End: 2019-08-24
  Administered 2019-08-24: 150 ug via INTRATHECAL

## 2019-08-24 MED ORDER — SODIUM CHLORIDE 0.9% FLUSH: 3.0000 mL | INTRAVENOUS | Status: DC | PRN

## 2019-08-24 MED ORDER — NALBUPHINE HCL 10 MG/ML IJ SOLN: 5.0000 mg | INTRAMUSCULAR | Status: DC | PRN

## 2019-08-24 MED ORDER — METOCLOPRAMIDE HCL 5 MG/ML IJ SOLN
INTRAMUSCULAR | Status: AC
  Filled 2019-08-24: qty 2

## 2019-08-24 MED ORDER — PHENYLEPHRINE 40 MCG/ML (10ML) SYRINGE FOR IV PUSH (FOR BLOOD PRESSURE SUPPORT)
PREFILLED_SYRINGE | INTRAVENOUS | Status: AC
  Filled 2019-08-24: qty 10

## 2019-08-24 MED ORDER — FENTANYL CITRATE (PF) 100 MCG/2ML IJ SOLN
50.0000 ug | Freq: Once | INTRAMUSCULAR | Status: AC
  Administered 2019-08-24: 50 ug via INTRAVENOUS
  Filled 2019-08-24: qty 2

## 2019-08-24 MED ORDER — NALOXONE HCL 4 MG/10ML IJ SOLN
1.0000 ug/kg/h | INTRAVENOUS | Status: DC | PRN
  Filled 2019-08-24: qty 5

## 2019-08-24 MED ORDER — HYDROMORPHONE HCL 1 MG/ML IJ SOLN
INTRAMUSCULAR | Status: AC
  Filled 2019-08-24: qty 0.5

## 2019-08-24 MED ORDER — OXYTOCIN-SODIUM CHLORIDE 30-0.9 UT/500ML-% IV SOLN: 2.5000 [IU]/h | INTRAVENOUS | Status: AC

## 2019-08-24 MED ORDER — PRENATAL MULTIVITAMIN CH: 1.0000 | ORAL_TABLET | Freq: Every day | ORAL | Status: DC

## 2019-08-24 MED ORDER — SCOPOLAMINE 1 MG/3DAYS TD PT72: 1.0000 | MEDICATED_PATCH | Freq: Once | TRANSDERMAL | Status: DC

## 2019-08-24 MED ORDER — SODIUM CHLORIDE 0.9% FLUSH: 3.0000 mL | Freq: Two times a day (BID) | INTRAVENOUS | Status: DC

## 2019-08-24 MED ORDER — LACTATED RINGERS IV SOLN: INTRAVENOUS | Status: DC | PRN

## 2019-08-24 MED ORDER — SODIUM CHLORIDE 0.9 % IV SOLN: 250.0000 mL | INTRAVENOUS | Status: DC | PRN

## 2019-08-24 MED ORDER — ACETAMINOPHEN 325 MG PO TABS
650.0000 mg | ORAL_TABLET | ORAL | Status: DC | PRN
  Administered 2019-08-24: 650 mg via ORAL
  Filled 2019-08-24: qty 2

## 2019-08-24 MED ORDER — OXYCODONE HCL 5 MG/5ML PO SOLN
ORAL | Status: AC
  Filled 2019-08-24: qty 5

## 2019-08-24 MED ORDER — BUPRENORPHINE HCL-NALOXONE HCL 8-2 MG SL SUBL: 1.0000 | SUBLINGUAL_TABLET | Freq: Every day | SUBLINGUAL | Status: DC

## 2019-08-24 MED ORDER — ACETAMINOPHEN 10 MG/ML IV SOLN
1000.0000 mg | Freq: Four times a day (QID) | INTRAVENOUS | Status: DC
  Administered 2019-08-24: 1000 mg via INTRAVENOUS
  Filled 2019-08-24 (×2): qty 100

## 2019-08-24 MED ORDER — KETOROLAC TROMETHAMINE 30 MG/ML IJ SOLN
INTRAMUSCULAR | Status: AC
  Filled 2019-08-24: qty 1

## 2019-08-24 MED ORDER — NALBUPHINE HCL 10 MG/ML IJ SOLN: 5.0000 mg | Freq: Once | INTRAMUSCULAR | Status: DC | PRN

## 2019-08-24 MED ORDER — CALCIUM CARBONATE ANTACID 500 MG PO CHEW: 2.0000 | CHEWABLE_TABLET | ORAL | Status: DC | PRN

## 2019-08-24 MED ORDER — BETAMETHASONE SOD PHOS & ACET 6 (3-3) MG/ML IJ SUSP
12.0000 mg | INTRAMUSCULAR | Status: DC
  Administered 2019-08-24: 12 mg via INTRAMUSCULAR
  Filled 2019-08-24: qty 5
  Filled 2019-08-24: qty 2

## 2019-08-24 MED ORDER — KETOROLAC TROMETHAMINE 30 MG/ML IJ SOLN: 30.0000 mg | Freq: Four times a day (QID) | INTRAMUSCULAR | Status: DC | PRN

## 2019-08-24 MED ORDER — ONDANSETRON HCL 4 MG/2ML IJ SOLN
INTRAMUSCULAR | Status: AC
  Filled 2019-08-24: qty 2

## 2019-08-24 MED ORDER — BUPIVACAINE IN DEXTROSE 0.75-8.25 % IT SOLN
INTRATHECAL | Status: DC | PRN
  Administered 2019-08-24: 1.4 mL via INTRATHECAL

## 2019-08-24 MED ORDER — DIPHENHYDRAMINE HCL 25 MG PO CAPS: 25.0000 mg | ORAL_CAPSULE | ORAL | Status: DC | PRN

## 2019-08-24 MED ORDER — VALACYCLOVIR HCL 500 MG PO TABS
500.0000 mg | ORAL_TABLET | Freq: Two times a day (BID) | ORAL | Status: DC
  Administered 2019-08-24: 500 mg via ORAL
  Filled 2019-08-24: qty 1

## 2019-08-24 MED ORDER — ACETAMINOPHEN 500 MG PO TABS
1000.0000 mg | ORAL_TABLET | Freq: Three times a day (TID) | ORAL | Status: DC
  Administered 2019-08-26 – 2019-08-27 (×5): 1000 mg via ORAL
  Filled 2019-08-24 (×5): qty 2

## 2019-08-24 MED ORDER — FENTANYL CITRATE (PF) 100 MCG/2ML IJ SOLN
INTRAMUSCULAR | Status: AC
  Filled 2019-08-24: qty 2

## 2019-08-24 MED ORDER — DIPHENHYDRAMINE HCL 50 MG/ML IJ SOLN: 12.5000 mg | INTRAMUSCULAR | Status: DC | PRN

## 2019-08-24 MED ORDER — CEFAZOLIN SODIUM-DEXTROSE 2-3 GM-%(50ML) IV SOLR
INTRAVENOUS | Status: DC | PRN
  Administered 2019-08-24: 2 g via INTRAVENOUS

## 2019-08-24 MED ORDER — CEFAZOLIN SODIUM-DEXTROSE 2-4 GM/100ML-% IV SOLN: 2.0000 g | INTRAVENOUS | Status: DC

## 2019-08-24 MED ORDER — WITCH HAZEL-GLYCERIN EX PADS: 1.0000 "application " | MEDICATED_PAD | CUTANEOUS | Status: DC | PRN

## 2019-08-24 MED ORDER — ENOXAPARIN SODIUM 40 MG/0.4ML ~~LOC~~ SOLN
40.0000 mg | SUBCUTANEOUS | Status: DC
  Administered 2019-08-26 – 2019-08-27 (×2): 40 mg via SUBCUTANEOUS
  Filled 2019-08-24 (×2): qty 0.4

## 2019-08-24 MED ORDER — OXYCODONE HCL 5 MG PO TABS: 5.0000 mg | ORAL_TABLET | ORAL | Status: DC | PRN

## 2019-08-24 MED ORDER — SODIUM CHLORIDE 0.9 % IV SOLN
500.0000 mg | Freq: Once | INTRAVENOUS | Status: AC
  Administered 2019-08-24: 250 mg via INTRAVENOUS
  Filled 2019-08-24: qty 500

## 2019-08-24 MED ORDER — COCONUT OIL OIL
1.0000 "application " | TOPICAL_OIL | Status: DC | PRN
  Administered 2019-08-25: 1 via TOPICAL

## 2019-08-24 MED ORDER — ONDANSETRON HCL 4 MG/2ML IJ SOLN: 4.0000 mg | Freq: Three times a day (TID) | INTRAMUSCULAR | Status: DC | PRN

## 2019-08-24 MED ORDER — NALOXONE HCL 0.4 MG/ML IJ SOLN: 0.4000 mg | INTRAMUSCULAR | Status: DC | PRN

## 2019-08-24 MED ORDER — DEXAMETHASONE SODIUM PHOSPHATE 4 MG/ML IJ SOLN
INTRAMUSCULAR | Status: DC | PRN
  Administered 2019-08-24: 4 mg via INTRAVENOUS

## 2019-08-24 MED ORDER — PHENYLEPHRINE HCL-NACL 20-0.9 MG/250ML-% IV SOLN
INTRAVENOUS | Status: AC
  Filled 2019-08-24: qty 250

## 2019-08-24 MED ORDER — PRENATAL MULTIVITAMIN CH
1.0000 | ORAL_TABLET | Freq: Every day | ORAL | Status: DC
  Administered 2019-08-24: 1 via ORAL
  Filled 2019-08-24: qty 1

## 2019-08-24 MED ORDER — PHENYLEPHRINE HCL (PRESSORS) 10 MG/ML IV SOLN
INTRAVENOUS | Status: DC | PRN
  Administered 2019-08-24: 80 ug via INTRAVENOUS

## 2019-08-24 MED ORDER — DIBUCAINE (PERIANAL) 1 % EX OINT: 1.0000 "application " | TOPICAL_OINTMENT | CUTANEOUS | Status: DC | PRN

## 2019-08-24 MED ORDER — MORPHINE SULFATE (PF) 0.5 MG/ML IJ SOLN
INTRAMUSCULAR | Status: AC
  Filled 2019-08-24: qty 10

## 2019-08-24 MED ORDER — HYDROMORPHONE HCL 1 MG/ML IJ SOLN
0.2500 mg | INTRAMUSCULAR | Status: DC | PRN
  Administered 2019-08-24 (×4): 0.5 mg via INTRAVENOUS

## 2019-08-24 MED ORDER — FENTANYL CITRATE (PF) 100 MCG/2ML IJ SOLN
INTRAMUSCULAR | Status: DC | PRN
Start: 2019-08-24 — End: 2019-08-24
  Administered 2019-08-24: 15 ug via INTRATHECAL

## 2019-08-24 MED ORDER — PROMETHAZINE HCL 25 MG/ML IJ SOLN: 6.2500 mg | INTRAMUSCULAR | Status: DC | PRN

## 2019-08-24 MED ORDER — HYDROMORPHONE 1 MG/ML IV SOLN
INTRAVENOUS | Status: DC
  Administered 2019-08-24: 30 mg via INTRAVENOUS
  Administered 2019-08-25: 2.9 mg via INTRAVENOUS
  Administered 2019-08-25: 2.6 mg via INTRAVENOUS
  Administered 2019-08-25: 2.9 mg via INTRAVENOUS
  Filled 2019-08-24: qty 30

## 2019-08-24 MED ORDER — KETOROLAC TROMETHAMINE 30 MG/ML IJ SOLN
30.0000 mg | Freq: Once | INTRAMUSCULAR | Status: AC | PRN
  Administered 2019-08-24: 30 mg via INTRAVENOUS

## 2019-08-24 MED ORDER — SODIUM CHLORIDE 0.9% FLUSH: 9.0000 mL | INTRAVENOUS | Status: DC | PRN

## 2019-08-24 MED ORDER — OXYTOCIN-SODIUM CHLORIDE 30-0.9 UT/500ML-% IV SOLN
INTRAVENOUS | Status: DC | PRN
  Administered 2019-08-24: 300 mL via INTRAVENOUS

## 2019-08-24 MED ORDER — SODIUM CHLORIDE 0.9 % IR SOLN
Status: DC | PRN
  Administered 2019-08-24: 1000 mL

## 2019-08-24 MED ORDER — OXYCODONE HCL 5 MG PO TABS: 5.0000 mg | ORAL_TABLET | Freq: Once | ORAL | Status: DC

## 2019-08-24 MED ORDER — DIPHENHYDRAMINE HCL 25 MG PO CAPS: 25.0000 mg | ORAL_CAPSULE | Freq: Four times a day (QID) | ORAL | Status: DC | PRN

## 2019-08-24 MED ORDER — CEFAZOLIN SODIUM-DEXTROSE 2-4 GM/100ML-% IV SOLN
2.0000 g | INTRAVENOUS | Status: DC
  Filled 2019-08-24: qty 100

## 2019-08-24 MED ORDER — ACETAMINOPHEN 500 MG PO TABS: 1000.0000 mg | ORAL_TABLET | Freq: Four times a day (QID) | ORAL | Status: DC

## 2019-08-24 MED ORDER — TETANUS-DIPHTH-ACELL PERTUSSIS 5-2.5-18.5 LF-MCG/0.5 IM SUSP: 0.5000 mL | Freq: Once | INTRAMUSCULAR | Status: DC

## 2019-08-24 MED ORDER — ONDANSETRON HCL 4 MG/2ML IJ SOLN: 4.0000 mg | Freq: Four times a day (QID) | INTRAMUSCULAR | Status: DC | PRN

## 2019-08-24 MED ORDER — METOCLOPRAMIDE HCL 5 MG/ML IJ SOLN
INTRAMUSCULAR | Status: DC | PRN
  Administered 2019-08-24: 10 mg via INTRAVENOUS

## 2019-08-24 MED ORDER — DEXAMETHASONE SODIUM PHOSPHATE 4 MG/ML IJ SOLN
INTRAMUSCULAR | Status: AC
  Filled 2019-08-24: qty 2

## 2019-08-24 MED ORDER — ONDANSETRON HCL 4 MG/2ML IJ SOLN
INTRAMUSCULAR | Status: DC | PRN
  Administered 2019-08-24: 4 mg via INTRAVENOUS

## 2019-08-24 MED ORDER — MEPERIDINE HCL 25 MG/ML IJ SOLN: 6.2500 mg | INTRAMUSCULAR | Status: DC | PRN

## 2019-08-24 MED ORDER — ACETAMINOPHEN 10 MG/ML IV SOLN
INTRAVENOUS | Status: AC
  Filled 2019-08-24: qty 100

## 2019-08-24 MED ORDER — SIMETHICONE 80 MG PO CHEW
80.0000 mg | CHEWABLE_TABLET | ORAL | Status: DC
  Administered 2019-08-25 – 2019-08-26 (×2): 80 mg via ORAL
  Filled 2019-08-24 (×2): qty 1

## 2019-08-24 MED ORDER — SIMETHICONE 80 MG PO CHEW
80.0000 mg | CHEWABLE_TABLET | Freq: Three times a day (TID) | ORAL | Status: DC
  Administered 2019-08-25 – 2019-08-27 (×7): 80 mg via ORAL
  Filled 2019-08-24 (×7): qty 1

## 2019-08-24 MED ORDER — MENTHOL 3 MG MT LOZG: 1.0000 | LOZENGE | OROMUCOSAL | Status: DC | PRN

## 2019-08-24 MED ORDER — SENNOSIDES-DOCUSATE SODIUM 8.6-50 MG PO TABS
2.0000 | ORAL_TABLET | ORAL | Status: DC
  Administered 2019-08-25 – 2019-08-26 (×2): 2 via ORAL
  Filled 2019-08-24 (×2): qty 2

## 2019-08-24 MED ORDER — SOD CITRATE-CITRIC ACID 500-334 MG/5ML PO SOLN: 30.0000 mL | Freq: Once | ORAL | Status: DC

## 2019-08-24 MED ORDER — SOD CITRATE-CITRIC ACID 500-334 MG/5ML PO SOLN
30.0000 mL | ORAL | Status: AC
  Administered 2019-08-24: 30 mL via ORAL
  Filled 2019-08-24: qty 30

## 2019-08-24 MED ORDER — OXYTOCIN-SODIUM CHLORIDE 30-0.9 UT/500ML-% IV SOLN
INTRAVENOUS | Status: AC
  Filled 2019-08-24: qty 500

## 2019-08-24 MED ORDER — PANTOPRAZOLE SODIUM 40 MG PO TBEC
40.0000 mg | DELAYED_RELEASE_TABLET | Freq: Every day | ORAL | Status: DC
  Administered 2019-08-24: 40 mg via ORAL
  Filled 2019-08-24: qty 1

## 2019-08-24 MED ORDER — DIPHENHYDRAMINE HCL 50 MG/ML IJ SOLN: 12.5000 mg | Freq: Four times a day (QID) | INTRAMUSCULAR | Status: DC | PRN

## 2019-08-24 MED ORDER — PHENYLEPHRINE HCL-NACL 20-0.9 MG/250ML-% IV SOLN
INTRAVENOUS | Status: DC | PRN
  Administered 2019-08-24: 60 ug/min via INTRAVENOUS

## 2019-08-24 SURGICAL SUPPLY — 35 items
APL SKNCLS STERI-STRIP NONHPOA (GAUZE/BANDAGES/DRESSINGS) ×1
BENZOIN TINCTURE PRP APPL 2/3 (GAUZE/BANDAGES/DRESSINGS) ×3 IMPLANT
CANISTER SUCT 3000ML PPV (MISCELLANEOUS) ×3 IMPLANT
CHLORAPREP W/TINT 26ML (MISCELLANEOUS) ×3 IMPLANT
CLOSURE WOUND 1/2 X4 (GAUZE/BANDAGES/DRESSINGS) ×1
DRSG OPSITE POSTOP 4X10 (GAUZE/BANDAGES/DRESSINGS) ×3 IMPLANT
ELECT REM PT RETURN 9FT ADLT (ELECTROSURGICAL) ×3
ELECTRODE REM PT RTRN 9FT ADLT (ELECTROSURGICAL) ×1 IMPLANT
EXTRACTOR VACUUM KIWI (MISCELLANEOUS) ×3 IMPLANT
GLOVE BIOGEL PI IND STRL 7.0 (GLOVE) ×2 IMPLANT
GLOVE BIOGEL PI IND STRL 7.5 (GLOVE) ×1 IMPLANT
GLOVE BIOGEL PI INDICATOR 7.0 (GLOVE) ×4
GLOVE BIOGEL PI INDICATOR 7.5 (GLOVE) ×2
GLOVE SKINSENSE NS SZ7.0 (GLOVE) ×2
GLOVE SKINSENSE STRL SZ7.0 (GLOVE) ×1 IMPLANT
GOWN STRL REUS W/ TWL LRG LVL3 (GOWN DISPOSABLE) ×2 IMPLANT
GOWN STRL REUS W/ TWL XL LVL3 (GOWN DISPOSABLE) ×1 IMPLANT
GOWN STRL REUS W/TWL LRG LVL3 (GOWN DISPOSABLE) ×6
GOWN STRL REUS W/TWL XL LVL3 (GOWN DISPOSABLE) ×3
NS IRRIG 1000ML POUR BTL (IV SOLUTION) ×3 IMPLANT
PACK C SECTION WH (CUSTOM PROCEDURE TRAY) ×3 IMPLANT
PAD ABD 7.5X8 STRL (GAUZE/BANDAGES/DRESSINGS) ×3 IMPLANT
PAD OB MATERNITY 4.3X12.25 (PERSONAL CARE ITEMS) ×3 IMPLANT
PAD PREP 24X48 CUFFED NSTRL (MISCELLANEOUS) ×3 IMPLANT
PENCIL SMOKE EVAC W/HOLSTER (ELECTROSURGICAL) ×3 IMPLANT
STRIP CLOSURE SKIN 1/2X4 (GAUZE/BANDAGES/DRESSINGS) ×2 IMPLANT
SUT MNCRL 0 VIOLET CTX 36 (SUTURE) ×2 IMPLANT
SUT MON AB 4-0 PS1 27 (SUTURE) ×5 IMPLANT
SUT MONOCRYL 0 CTX 36 (SUTURE) ×6
SUT PLAIN 2 0 XLH (SUTURE) ×5 IMPLANT
SUT VIC AB 0 CT1 36 (SUTURE) ×6 IMPLANT
SUT VIC AB 3-0 CT1 27 (SUTURE) ×3
SUT VIC AB 3-0 CT1 TAPERPNT 27 (SUTURE) ×1 IMPLANT
TOWEL OR 17X24 6PK STRL BLUE (TOWEL DISPOSABLE) ×6 IMPLANT
WATER STERILE IRR 1000ML POUR (IV SOLUTION) ×3 IMPLANT

## 2019-08-24 NOTE — Anesthesia Procedure Notes (Signed)
Spinal  Patient location during procedure: OR Start time: 08/24/2019 5:53 PM End time: 08/24/2019 5:54 PM Staffing Performed: anesthesiologist  Anesthesiologist: Leilani Able, MD Preanesthetic Checklist Completed: patient identified, IV checked, site marked, risks and benefits discussed, surgical consent, monitors and equipment checked, pre-op evaluation and timeout performed Spinal Block Patient position: sitting Prep: DuraPrep and site prepped and draped Patient monitoring: continuous pulse ox and blood pressure Approach: midline Location: L3-4 Injection technique: single-shot Needle Needle type: Pencan  Needle gauge: 24 G Needle length: 10 cm Needle insertion depth: 4 cm Assessment Sensory level: T4

## 2019-08-24 NOTE — H&P (Signed)
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 ---------------------------------------------------------------------- Anatomy (Fetus B)  Cranium:               Appears normal         Aortic Arch:            Previously seen  Cavum:                 Appears normal         Ductal Arch:            Previously seen  Ventricles:            Previously seen        Diaphragm:              Appears normal  Choroid Plexus:        Previously seen        Stomach:                Appears normal, left                                                                         sided  Cerebellum:            Previously seen        Abdomen:                Previously seen  Posterior Fossa:       Previously seen        Abdominal Wall:         Previously seen  Nuchal Fold:           Previously seen        Cord Vessels:           Previously seen  Face:                  Orbits and profile     Kidneys:                Appear normal                         previously seen  Lips:                  Previously seen        Bladder:                Appears normal  Thoracic:              Previously seen        Spine:                  Previously seen  Heart:                 Previously seen        Upper Extremities:      Previously seen  RVOT:                  Previously seen        Lower Extremities:      Previously  seen  LVOT:                  Previously seen  Other:  Heels previously visualized. Technically difficult due to fetal position. ---------------------------------------------------------------------- Doppler - Fetal Vessels (Fetus B)  Umbilical Artery   S/D     %tile                                              ADFV    RDFV   2.88       72                                                 No      No ---------------------------------------------------------------------- Cervix Uterus Adnexa  Cervix  Not visualized (advanced GA >24wks)  Uterus  No abnormality visualized.  Right Ovary  Not visualized.  Left Ovary  Not visualized.  Cul De Sac  No free fluid seen.  Adnexa  No abnormality visualized. ---------------------------------------------------------------------- Comments  This patient was seen for a follow-up ultrasound exam and  umbilical artery Doppler studies due to a monochorionic,  diamniotic twin gestation.  IUGR of twin B was noted during  her last growth ultrasound.  She denies any problems since  her last exam.  The fetal growth and amniotic fluid level appeared  appropriate for her gestational age for twin A.  The overall EFW for twin B measures at the 3rd percentile for  her  gestational age, indicating fetal growth restriction.  Twin B  has grown over half a pound over the past 3 weeks.  There  was normal amniotic fluid noted around twin B.  A biophysical profile performed today was 8 out of 8 for both  twin A and twin B.  Doppler studies of the umbilical arteries performed for both  fetuses today continues to show normal forward flow with  normal S/D ratios.  There were no signs of absent or reversed  end-diastolic flow noted in either fetus today.  Due to a monochorionic twin gestation with IUGR of twin B,  delivery is recommended at around 36 weeks.  As she will  require a late preterm delivery, she should receive a  complete course of antenatal corticosteroids prior to delivery.  As both fetuses are in the breech presentations, she will most  likely require a cesarean delivery.  She will return in 1 week for another ultrasound exam. ----------------------------------------------------------------------                   Johnell Comings, MD Electronically Signed Final Report   08/13/2019 11:18 am ----------------------------------------------------------------------  [place] MFM FETAL BPP WO NON STRESS  Result Date: 08/06/2019 ----------------------------------------------------------------------  OBSTETRICS REPORT                       (Signed Final 08/06/2019 10:08 am) ---------------------------------------------------------------------- Patient Info  ID #:       [id number]                          D.O.B.:  1995/11/01 (24 yrs)  Name:       Laqueta A Iribe  Visit Date: 08/06/2019 09:39 am ---------------------------------------------------------------------- Performed By  Attending:        Johnell Comings MD         Secondary Phy.:   [facility]                                                             [facility]  Performed By:     Corky Crafts             Address:          [address]                    [place]                                                              [place], [place]  Referred By:      Eduard Clos                Location:         [facility]                               [facility]                                                             [place] [facility]                                                             [facility]  Ref. Address:     [place], [place] [address] ---------------------------------------------------------------------- Orders  #  Description                           Code        Ordered By  1  [place] MFM FETAL BPP WO NST               76819.1     YU FANG     ADDL GESTATION  2  [place] MFM FETAL BPP WO NON               76819.01    YU FANG     STRESS  3  [place] MFM UA CORD DOPPLER                76820.02    YU FANG  4  [place] MFM UA DOPPLER ADDL                76820.03    YU FANG     GEST RE EVAL ----------------------------------------------------------------------  #  Order #                     Accession #                Episode #  1  [id number]                   [id number]                 [id number]  2  [id number]                   [id number]                 [id number]  3  [id number]                   [id number]                 [id number]  4  [id number]                   [id number]                 [id number] ---------------------------------------------------------------------- Indications  Maternal care for known or suspected poor      O36.5931  fetal growth, third trimester, fetus 1 IUGR  (twin B)  [redacted] weeks gestation of pregnancy                Z3A.33  Twin pregnancy, mono/di, third trimester       O30.033  Low Risk NIPS  Pregnancy comlicated by subutex                O99.320, F11.20,  maintenance, antepartum                        Z79.891  Poor obstetric history: Previous               O09.299  preeclampsia / eclampsia/gestational HTN  ---------------------------------------------------------------------- Vital Signs                                                 Height:        5'2" ---------------------------------------------------------------------- Fetal Evaluation (Fetus A)  Num Of Fetuses:         2  Fetal Heart Rate(bpm):  141  Cardiac Activity:       Observed  Presentation:           Breech  Placenta:  Anterior  Amniotic Fluid  AFI FV:      Within normal limits                              Largest Pocket(cm)                              2.76 ---------------------------------------------------------------------- Biophysical Evaluation (Fetus A)  Amniotic F.V:   Within normal limits       F. Tone:        Observed  F. Movement:    Observed                   Score:          8/8  F. Breathing:   Observed ---------------------------------------------------------------------- OB History  Gravidity:    3         Term:   1        Prem:   0        SAB:   0  TOP:          0       Ectopic:  0        Living: 1 ---------------------------------------------------------------------- Gestational Age (Fetus A)  LMP:           33w 2d        Date:  12/16/18                 EDD:   09/22/19  Best:          33w 2d     Det. By:  LMP  (12/16/18)          EDD:   09/22/19 ---------------------------------------------------------------------- Doppler - Fetal Vessels (Fetus A)  Umbilical Artery   S/D     %tile                                              ADFV    RDFV   2.66       55                                                 No      No ---------------------------------------------------------------------- Fetal Evaluation (Fetus B)  Num Of Fetuses:         2  Fetal Heart Rate(bpm):  143  Cardiac Activity:       Observed  Presentation:           Transverse, head to maternal left  Placenta:               Anterior  Amniotic Fluid  AFI FV:      Within normal limits                              Largest Pocket(cm)                              3.53  ---------------------------------------------------------------------- Biophysical Evaluation (Fetus B)  Amniotic F.V:   Within normal limits  F. Tone:        Observed  F. Movement:    Observed                   Score:          8/8  F. Breathing:   Observed ---------------------------------------------------------------------- Gestational Age (Fetus B)  LMP:           33w 2d        Date:  12/16/18                 EDD:   09/22/19  Best:          33w 2d     Det. By:  LMP  (12/16/18)          EDD:   09/22/19 ---------------------------------------------------------------------- Doppler - Fetal Vessels (Fetus B)  Umbilical Artery   S/D     %tile                                              ADFV    RDFV   2.75       60                                                 No      No ---------------------------------------------------------------------- Cervix Uterus Adnexa  Cervix  Not visualized (advanced GA >24wks) ---------------------------------------------------------------------- Comments  This patient was seen for a biophysical profile and umbilical  artery Doppler studies due to a monochorionic, diamniotic  twin gestation.  IUGR of twin B was noted during her last  growth ultrasound.  She denies any problems since her last  exam.  A biophysical profile performed today was 8 out of 8 for both  twin A and twin B.  There was normal amniotic fluid noted around both fetuses.  Doppler studies of the umbilical arteries performed for both  fetuses today continues to show normal forward flow with a  normal S/D ratio.  There were no signs of absent or reversed  end-diastolic flow noted in either fetus today.  She will return in 1 week for another ultrasound exam. ----------------------------------------------------------------------                   Johnell Comings, MD Electronically Signed Final Report   08/06/2019 10:08 am ----------------------------------------------------------------------  [place] MFM FETAL BPP WO NON  STRESS  Result Date: 07/30/2019 ----------------------------------------------------------------------  OBSTETRICS REPORT                       (Signed Final 07/30/2019 06:20 pm) ---------------------------------------------------------------------- Patient Info  ID #:       [id number]                          D.O.B.:  21-Feb-1995 (24 yrs)  Name:       Jamyla A Rudnick                Visit Date: 07/30/2019 08:40 am ---------------------------------------------------------------------- Performed By  Attending:        Tama High MD        Secondary Phy.:   [facility]  Performed By:     Germain Osgood            Address:          [address]                                                             [place], [place]  Referred By:      Eduard Clos                Location:         [facility]                               [facility] at                                                             [place] for                                                             Women  Ref. Address:     [address], [place] ---------------------------------------------------------------------- Orders  #  Description                           Code        Ordered By  1  [place] MFM FETAL BPP WO NON               76819.01    YU FANG     STRESS  2  [place] MFM FETAL BPP WO NST               76819.1     YU FANG     ADDL GESTATION  3  [place] MFM UA CORD DOPPLER                76820.02    YU FANG  4  [place] MFM UA DOPPLER ADDL  98338.25    YU FANG     GEST RE EVAL ----------------------------------------------------------------------  #  Order #                     Accession #                Episode #  1  [id number]                   [id number]                 [id number]  2  [id number]                    [id number]                 [id number]  3  [id number]                   [id number]                 [id number]  4  [id number]                   [id number]                 [id number] ---------------------------------------------------------------------- Indications  [redacted] weeks gestation of pregnancy                Z3A.44  Twin pregnancy, mono/di, third trimester       O30.033  Maternal care for known or suspected poor      O36.5931  fetal growth, third trimester, fetus 1 IUGR  (twin B)  Low Risk NIPS  Pregnancy comlicated by subutex                O99.320, F11.20,  maintenance, antepartum                        Z79.891  Poor obstetric history: Previous               O09.299  preeclampsia / eclampsia/gestational HTN ---------------------------------------------------------------------- Vital Signs                                                 Height:        5'2" ---------------------------------------------------------------------- Fetal Evaluation (Fetus A)  Num Of Fetuses:         2  Fetal Heart Rate(bpm):  159  Cardiac Activity:       Observed  Fetal Lie:              Maternal right side  Presentation:           Cephalic  Placenta:               Anterior  P. Cord Insertion:      Previously Visualized  Amniotic Fluid  AFI FV:      Within normal limits                              Largest Pocket(cm)                              3.7 ---------------------------------------------------------------------- Biophysical Evaluation (Fetus A)  Amniotic F.V:   Pocket =>  2 cm             F. Tone:        Observed  F. Movement:    Observed                   Score:          8/8  F. Breathing:   Observed ---------------------------------------------------------------------- OB History  Gravidity:    3         Term:   1        Prem:   0        SAB:   0  TOP:          0       Ectopic:  0        Living: 1 ---------------------------------------------------------------------- Gestational Age (Fetus A)  LMP:           32w 2d        Date:   12/16/18                 EDD:   09/22/19  Best:          Milderd Meager 2d     Det. By:  LMP  (12/16/18)          EDD:   09/22/19 ---------------------------------------------------------------------- Anatomy (Fetus A)  Ventricles:            Previously seen        Kidneys:                Appear normal  Diaphragm:             Appears normal         Bladder:                Appears normal  Stomach:               Appears normal, left                         sided ---------------------------------------------------------------------- Doppler - Fetal Vessels (Fetus A)  Umbilical Artery   S/D     %tile      RI    %tile      PI    %tile            ADFV    RDFV   2.55       43    0.61       51    0.93       57               No      No ---------------------------------------------------------------------- Fetal Evaluation (Fetus B)  Num Of Fetuses:         2  Fetal Heart Rate(bpm):  150  Cardiac Activity:       Observed  Fetal Lie:              Maternal left side  Presentation:           Breech  Placenta:               Anterior  P. Cord Insertion:      Previously Visualized  Amniotic Fluid  AFI FV:      Within normal limits                              Largest  Pocket(cm)                              3.8 ---------------------------------------------------------------------- Biophysical Evaluation (Fetus B)  Amniotic F.V:   Pocket => 2 cm             F. Tone:        Observed  F. Movement:    Observed                   Score:          8/8  F. Breathing:   Observed ---------------------------------------------------------------------- Gestational Age (Fetus B)  LMP:           32w 2d        Date:  12/16/18                 EDD:   09/22/19  Best:          Milderd Meager 2d     Det. By:  LMP  (12/16/18)          EDD:   09/22/19 ---------------------------------------------------------------------- Anatomy (Fetus B)  Ventricles:            Previously seen        Kidneys:                Appear normal  Diaphragm:             Appears normal         Bladder:                 Appears normal  Stomach:               Appears normal, left                         sided ---------------------------------------------------------------------- Doppler - Fetal Vessels (Fetus B)  Umbilical Artery   S/D     %tile      RI    %tile      PI    %tile            ADFV    RDFV   2.73       55    0.63       60    0.93       57               No      No ---------------------------------------------------------------------- Cervix Uterus Adnexa  Cervix  Not visualized (advanced GA >24wks) ---------------------------------------------------------------------- Impression  Monochorionic-diamniotic twin pregnancy with selective fetal  growth restriction in twin B.  Patient return for BPP and umbilical artery Doppler studies.  Twin A: Maternal right, anterior placenta, cephalic  presentation.Amniotic fluid is normal and good fetal activity is  seen. Umbilical artery Doppler showed normal forward  diastolic flow .anAntenatal testing is reassuring. BPP 8/8.  Twin B: Maternal left, anterior placenta, breech  presentation.Amniotic fluid is normal and good fetal activity is  seen .Antenatal testing is reassuring. BPP 8/8. Umbilical  artery Doppler showed normal forward diastolic flow .  We reassured the patient of the findings. ---------------------------------------------------------------------- Recommendations  -Continue weekly BPP till delivery.  -If growth restriction persists, delivery may be considered at  36 to [redacted] weeks gestation. ----------------------------------------------------------------------                  Tama High, MD Electronically Signed Final Report   07/30/2019  06:20 pm ----------------------------------------------------------------------  [place] MFM OB FOLLOW UP  Result Date: 08/13/2019 ----------------------------------------------------------------------  OBSTETRICS REPORT                       (Signed Final 08/13/2019 11:18 am)  ---------------------------------------------------------------------- Patient Info  ID #:       [id number]                          D.O.B.:  10-May-1995 (24 yrs)  Name:       Kaitelyn A Bolen                Visit Date: 08/13/2019 08:34 am ---------------------------------------------------------------------- Performed By  Attending:        Johnell Comings MD         Secondary Phy.:   [facility]                                                             [facility]  Performed By:     Rodrigo Ran BS      Address:          [address]                                                             [place], [place]  Referred By:      Eduard Clos                Location:         [facility]                               [facility] at                                                             [place] for                                                             Women  Ref. Address:  [place], [place] ---------------------------------------------------------------------- Orders  #  Description                           Code        Ordered By  1  [place] MFM FETAL BPP WO NON               76819.01    YU FANG     STRESS  2  [place] MFM OB FOLLOW UP                   76816.01    YU FANG  3  [place] MFM OB FOLLOW UP ADDL              86761.95    YU FANG     GEST  4  [place] MFM FETAL BPP WO NST               09326.7     YU FANG     ADDL GESTATION  5  [place] MFM UA CORD DOPPLER                76820.02    YU FANG  6  [place] MFM UA DOPPLER ADDL                76820.03    YU FANG     GEST RE EVAL ----------------------------------------------------------------------  #  Order #                     Accession #                Episode #  1  [id number]                   [id number]                 [id number]  2  [id number]                   [id number]                  [id number]  3  [id number]                   [id number]                 [id number]  4  [id number]                   [id number]                 [id number]  5  [id number]                   [id number]                 [id number]  6  [id number]                   [id number]                 [id number] ---------------------------------------------------------------------- Indications  Maternal care for known or  suspected poor      O36.5932  fetal growth, third trimester, fetus 2 IUGR  Twin pregnancy, mono/di, third trimester       O30.033  [redacted] weeks gestation of pregnancy                Z3A.34  Low Risk NIPS  Pregnancy comlicated by subutex                O99.320, F11.20,  maintenance, antepartum                        Z79.891  Poor obstetric history: Previous               O09.299  preeclampsia / eclampsia/gestational HTN ---------------------------------------------------------------------- Vital Signs                                                 Height:        5'2" ---------------------------------------------------------------------- Fetal Evaluation (Fetus A)  Num Of Fetuses:         2  Fetal Heart Rate(bpm):  128  Cardiac Activity:       Observed  Fetal Lie:              Maternal right side/superior NON PRES  Presentation:           Transverse, head to maternal right  Placenta:               Anterior  P. Cord Insertion:      Previously Visualized  Membrane Desc:      Dividing Membrane seen - Monochorionic  Amniotic Fluid  AFI FV:      Within normal limits                              Largest Pocket(cm)                              5.9 ---------------------------------------------------------------------- Biophysical Evaluation (Fetus A)  Amniotic F.V:   Within normal limits       F. Tone:        Observed  F. Movement:    Observed                   Score:          8/8  F. Breathing:   Observed ---------------------------------------------------------------------- Biometry (Fetus A)  BPD:      82.1  mm     G. Age:   33w 0d         15  %    CI:        70.36   %    70 - 86                                                          FL/HC:      20.2   %    19.4 - 21.8  HC:      312.1  mm     G. Age:  35w 31d  30  %    HC/AC:      1.06        0.96 - 1.11  AC:      294.1  mm     G. Age:  33w 3d         45  %    FL/BPD:     77.0   %    71 - 87  FL:       63.2  mm     G. Age:  32w 5d          8  %    FL/AC:      21.5   %    20 - 24  HUM:      54.3  mm     G. Age:  31w 4d          5  %  Est. FW:    2167  gm    4 lb 12 oz      19  %     FW Discordancy     0 \ 15 % ---------------------------------------------------------------------- OB History  Gravidity:    3         Term:   1        Prem:   0        SAB:   0  TOP:          0       Ectopic:  0        Living: 1 ---------------------------------------------------------------------- Gestational Age (Fetus A)  LMP:           34w 2d        Date:  12/16/18                 EDD:   09/22/19  U/S Today:     33w 4d                                        EDD:   09/27/19  Best:          34w 2d     Det. By:  LMP  (12/16/18)          EDD:   09/22/19 ---------------------------------------------------------------------- Anatomy (Fetus A)  Cranium:               Previously seen        Aortic Arch:            Previously seen  Cavum:                 Previously seen        Ductal Arch:            Previously seen  Ventricles:            Appears normal         Diaphragm:              Appears normal  Choroid Plexus:        Previously seen        Stomach:                Appears normal, left  sided  Cerebellum:            Appears normal         Abdomen:                Previously seen  Posterior Fossa:       Previously seen        Abdominal Wall:         Previously seen  Nuchal Fold:           Previously seen        Cord Vessels:           Previously seen  Face:                  Orbits and profile     Kidneys:                Appear normal                          previously seen  Lips:                  Previously seen        Bladder:                Appears normal  Thoracic:              Previously seen        Spine:                  Previously seen  Heart:                 Previously seen        Upper Extremities:      Previously seen  RVOT:                  Previously seen        Lower Extremities:      Previously seen  LVOT:                  Previously seen  Other:  Heels previously visualized. Technically difficult due to fetal position. ---------------------------------------------------------------------- Doppler - Fetal Vessels (Fetus A)  Umbilical Artery   S/D     %tile                                              ADFV    RDFV   2.81       68                                                 No      No ---------------------------------------------------------------------- Fetal Evaluation (Fetus B)  Num Of Fetuses:         2  Fetal Heart Rate(bpm):  126  Cardiac Activity:       Observed  Fetal Lie:              Maternal left side  Presentation:           Breech  Placenta:               Anterior  P. Cord Insertion:  Previously Visualized  Amniotic Fluid  AFI FV:      Within normal limits                              Largest Pocket(cm)                              5.1 ---------------------------------------------------------------------- Biophysical Evaluation (Fetus B)  Amniotic F.V:   Within normal limits       F. Tone:        Observed  F. Movement:    Observed                   Score:          8/8  F. Breathing:   Observed ---------------------------------------------------------------------- Biometry (Fetus B)  BPD:        79  mm     G. Age:  31w 5d        1.9  %    CI:        68.27   %    70 - 86                                                          FL/HC:      20.1   %    19.4 - 21.8  HC:      305.7  mm     G. Age:  34w 0d         12  %    HC/AC:      1.12        0.96 - 1.11  AC:      272.7  mm     G. Age:  31w 2d        1.5  %     FL/BPD:     77.6   %    71 - 87  FL:       61.3  mm     G. Age:  31w 6d        2.2  %    FL/AC:      22.5   %    20 - 24  HUM:      53.4  mm     G. Age:  31w 1d        < 5  %  Est. FW:    1838  gm      4 lb 1 oz    2.6  %     FW Discordancy        15  % ---------------------------------------------------------------------- Gestational Age (Fetus B)  LMP:           34w 2d        Date:  12/16/18                 EDD:   09/22/19  U/S Today:     32w 2d                                        EDD:   10/06/19  Best:  34w 2d     Det. By:  LMP  (12/16/18)          EDD:   09/22/19 ---------------------------------------------------------------------- Anatomy (Fetus B)  Cranium:               Appears normal         Aortic Arch:            Previously seen  Cavum:                 Appears normal         Ductal Arch:            Previously seen  Ventricles:            Previously seen        Diaphragm:              Appears normal  Choroid Plexus:        Previously seen        Stomach:                Appears normal, left                                                                        sided  Cerebellum:            Previously seen        Abdomen:                Previously seen  Posterior Fossa:       Previously seen        Abdominal Wall:         Previously seen  Nuchal Fold:           Previously seen        Cord Vessels:           Previously seen  Face:                  Orbits and profile     Kidneys:                Appear normal                         previously seen  Lips:                  Previously seen        Bladder:                Appears normal  Thoracic:              Previously seen        Spine:                  Previously seen  Heart:                 Previously seen        Upper Extremities:      Previously seen  RVOT:                  Previously seen        Lower Extremities:      Previously seen  LVOT:                  Previously seen  Other:  Heels previously visualized. Technically difficult due to fetal  position. ---------------------------------------------------------------------- Doppler - Fetal Vessels (Fetus B)  Umbilical Artery   S/D     %tile                                              ADFV    RDFV   2.88       72                                                 No      No ---------------------------------------------------------------------- Cervix Uterus Adnexa  Cervix  Not visualized (advanced GA >24wks)  Uterus  No abnormality visualized.  Right Ovary  Not visualized.  Left Ovary  Not visualized.  Cul De Sac  No free fluid seen.  Adnexa  No abnormality visualized. ---------------------------------------------------------------------- Comments  This patient was seen for a follow-up ultrasound exam and  umbilical artery Doppler studies due to a monochorionic,  diamniotic twin gestation.  IUGR of twin B was noted during  her last growth ultrasound.  She denies any problems since  her last exam.  The fetal growth and amniotic fluid level appeared  appropriate for her gestational age for twin A.  The overall EFW for twin B measures at the 3rd percentile for  her gestational age, indicating fetal growth restriction.  Twin B  has grown over half a pound over the past 3 weeks.  There  was normal amniotic fluid noted around twin B.  A biophysical profile performed today was 8 out of 8 for both  twin A and twin B.  Doppler studies of the umbilical arteries performed for both  fetuses today continues to show normal forward flow with  normal S/D ratios.  There were no signs of absent or reversed  end-diastolic flow noted in either fetus today.  Due to a monochorionic twin gestation with IUGR of twin B,  delivery is recommended at around 36 weeks.  As she will  require a late preterm delivery, she should receive a  complete course of antenatal corticosteroids prior to delivery.  As both fetuses are in the breech presentations, she will most  likely require a cesarean delivery.  She will return in 1 week for another  ultrasound exam. ----------------------------------------------------------------------                   Johnell Comings, MD Electronically Signed Final Report   08/13/2019 11:18 am ----------------------------------------------------------------------  [place] MFM OB FOLLOW UP  Result Date: 07/26/2019 ----------------------------------------------------------------------  OBSTETRICS REPORT                       (Signed Final 07/26/2019 05:32 pm) ---------------------------------------------------------------------- Patient Info  ID #:       [id number]                          D.O.B.:  01-08-1995 (24 yrs)  Name:       Kalise A Storbeck  Visit Date: 07/26/2019 02:52 pm ---------------------------------------------------------------------- Performed By  Attending:        Johnell Comings MD         Secondary Phy.:   [facility]                                                             [facility]  Performed By:     Jacob Moores BS,       Address:          [place], [place]                                                             [place], [place]  Referred By:      Eduard Clos                Location:         [facility]                               [facility]                                                             [place] [facility]                                                             [facility]  Ref. Address:     [place], [place] ---------------------------------------------------------------------- Orders  #  Description                           Code        Ordered By  1  [place] MFM  OB FOLLOW UP                   76816.01    RAVI SHANKAR  2  [place] MFM OB FOLLOW UP ADDL              [id number]    RAVI SHANKAR     GEST  3  [place] MFM UA CORD DOPPLER                76820.02    RAVI SHANKAR  4  [place] MFM UA ADDL GEST                    76820.01    RAVI SHANKAR ----------------------------------------------------------------------  #  Order #                     Accession #                Episode #  1  [id number]                   [id number]  2  [id number]                   [id number]                 [id number]  3  [id number]                   [id number]                 [id number]  4  [id number]                   [id number]                 [id number] ---------------------------------------------------------------------- Indications  Twin pregnancy, mono/di, third trimester       O30.033  Maternal care for known or suspected poor      O36.5931  fetal growth, third trimester, fetus 1 IUGR  [redacted] weeks gestation of pregnancy                Z3A.31  Low Risk NIPS  Pregnancy comlicated by subutex                O99.320, F11.20,  maintenance, antepartum                        Z79.891  Poor obstetric history: Previous               O09.299  preeclampsia / eclampsia/gestational HTN ---------------------------------------------------------------------- Vital Signs                                                 Height:        5'2" ---------------------------------------------------------------------- Fetal Evaluation (Fetus A)  Num Of Fetuses:         2  Fetal Heart Rate(bpm):  138  Cardiac Activity:       Observed  Fetal Lie:              Maternal right side/ Superior  Presentation:           Transverse, head to maternal left  Placenta:  Anterior  P. Cord Insertion:      Previously Visualized  Amniotic Fluid  AFI FV:      Within normal limits                              Largest Pocket(cm)                              3.1 ---------------------------------------------------------------------- Biometry (Fetus A)  BPD:      75.1  mm     G. Age:  30w 1d          6  %    CI:        70.95   %    70 - 86                                                          FL/HC:      20.9   %    19.1 - 21.3  HC:      284.1  mm      G. Age:  31w 1d          7  %    HC/AC:      1.02        0.96 - 1.17  AC:      279.6  mm     G. Age:  32w 0d         57  %    FL/BPD:     79.2   %    71 - 87  FL:       59.5  mm     G. Age:  31w 0d         19  %    FL/AC:      21.3   %    20 - 24  CER:      40.6  mm     G. Age:  32w 4d         52  %  LV:        3.5  mm  Est. FW:    1771  gm    3 lb 14 oz      30  %     FW Discordancy     0 \ 13 % ---------------------------------------------------------------------- OB History  Gravidity:    3         Term:   1        Prem:   0        SAB:   0  TOP:          0       Ectopic:  0        Living: 1 ---------------------------------------------------------------------- Gestational Age (Fetus A)  LMP:           31w 5d        Date:  12/16/18                 EDD:   09/22/19  U/S Today:     31w 1d  EDD:   09/26/19  Best:          31w 5d     Det. By:  LMP  (12/16/18)          EDD:   09/22/19 ---------------------------------------------------------------------- Anatomy (Fetus A)  Cranium:               Previously seen        Aortic Arch:            Previously seen  Cavum:                 Previously seen        Ductal Arch:            Previously seen  Ventricles:            Appears normal         Diaphragm:              Appears normal  Choroid Plexus:        Previously seen        Stomach:                Appears normal, left                                                                        sided  Cerebellum:            Appears normal         Abdomen:                Previously seen  Posterior Fossa:       Previously seen        Abdominal Wall:         Previously seen  Nuchal Fold:           Previously seen        Cord Vessels:           Previously seen  Face:                  Orbits and profile     Kidneys:                Appear normal                         previously seen  Lips:                  Previously seen        Bladder:                Appears normal  Thoracic:               Previously seen        Spine:                  Previously seen  Heart:                 Previously seen        Upper Extremities:      Previously seen  RVOT:                  Previously seen  Lower Extremities:      Previously seen  LVOT:                  Previously seen  Other:  Heels previously visualized. Technically difficult due to fetal position. ---------------------------------------------------------------------- Doppler - Fetal Vessels (Fetus A)  Umbilical Artery   S/D     %tile      RI    %tile      PI    %tile     PSV    ADFV    RDFV                                                     (cm/s)   2.79       55    0.64       61       1       68    42.23      No      No ---------------------------------------------------------------------- Fetal Evaluation (Fetus B)  Num Of Fetuses:         2  Fetal Heart Rate(bpm):  153  Cardiac Activity:       Observed  Fetal Lie:              Maternal left side/ Inferior Presenting  Presentation:           Breech  Placenta:               Anterior  P. Cord Insertion:      Previously Visualized  Amniotic Fluid  AFI FV:      Within normal limits                              Largest Pocket(cm)                              4.8 ---------------------------------------------------------------------- Biometry (Fetus B)  BPD:        74  mm     G. Age:  29w 5d        2.7  %    CI:        73.24   %    70 - 86                                                          FL/HC:      21.5   %    19.1 - 21.3  HC:      274.8  mm     G. Age:  30w 0d        < 1  %    HC/AC:      1.06        0.96 - 1.17  AC:      259.9  mm     G. Age:  30w 1d         10  %    FL/BPD:     79.7   %    71 - 87  FL:  59  mm     G. Age:  30w 5d         15  %    FL/AC:      22.7   %    20 - 24  CER:      40.1  mm     G. Age:  32w 2d         45  %  LV:        4.1  mm  Est. FW:    1547  gm      3 lb 7 oz      7  %     FW Discordancy        13  %  ---------------------------------------------------------------------- Gestational Age (Fetus B)  LMP:           31w 5d        Date:  12/16/18                 EDD:   09/22/19  U/S Today:     30w 1d                                        EDD:   10/03/19  Best:          31w 5d     Det. By:  LMP  (12/16/18)          EDD:   09/22/19 ---------------------------------------------------------------------- Anatomy (Fetus B)  Cranium:               Previously seen        Aortic Arch:            Previously seen  Cavum:                 Previously seen        Ductal Arch:            Previously seen  Ventricles:            Appears normal         Diaphragm:              Appears normal  Choroid Plexus:        Previously seen        Stomach:                Appears normal, left                                                                        sided  Cerebellum:            Previously seen        Abdomen:                Previously seen  Posterior Fossa:       Previously seen        Abdominal Wall:         Previously seen  Nuchal Fold:           Previously seen        Cord Vessels:  Previously seen  Face:                  Orbits and profile     Kidneys:                Appear normal                         previously seen  Lips:                  Previously seen        Bladder:                Appears normal  Thoracic:              Previously seen        Spine:                  Previously seen  Heart:                 Previously seen        Upper Extremities:      Previously seen  RVOT:                  Previously seen        Lower Extremities:      Previously seen  LVOT:                  Previously seen  Other:  Heels previously visualized. Technically difficult due to fetal position. ---------------------------------------------------------------------- Doppler - Fetal Vessels (Fetus B)  Umbilical Artery   S/D     %tile      RI    %tile      PI    %tile     PSV    ADFV    RDFV                                                      (cm/s)   2.57       42    0.61       47    0.88       44    40.54      No      No ---------------------------------------------------------------------- Cervix Uterus Adnexa  Cervix  Not visualized (advanced GA >24wks)  Uterus  No abnormality visualized.  Right Ovary  Within normal limits. No adnexal mass visualized.  Left Ovary  Within normal limits. No adnexal mass visualized.  Cul De Sac  No free fluid seen.  Adnexa  No abnormality visualized. ---------------------------------------------------------------------- Comments  This patient was seen for a follow up growth scan due to a  monochorionic, diamniotic twin gestation.  She denies any  problems since her last exam.  She was informed that the fetal growth and amniotic fluid  level for twin A appears appropriate for her gestational age.  Fetal growth restriction is suspected in twin B as the overall  EFW obtained today for twin B measured at the 7th  percentile for her gestational age.  There was normal  amniotic fluid noted around twin B.  Doppler studies of the umbilical arteries performed for both  fetuses today continues to show normal forward flow with a  normal S/D ratio.  There were no signs of  absent or reversed  end-diastolic flow noted in either fetus today.  Due to fetal growth restriction of twin B, we will continue to  follow her with weekly fetal testing and umbilical artery  Doppler studies.  Another growth ultrasound to be scheduled  in 3 weeks.  Should fetal growth restriction continue to be noted at her  next growth ultrasound, consideration should be given to  delivery of this monochorionic twin gestation at around 36  weeks. ----------------------------------------------------------------------                   Johnell Comings, MD Electronically Signed Final Report   07/26/2019 05:32 pm ----------------------------------------------------------------------  [place] MFM OB FOLLOW UP ADDL GEST  Result Date:  08/13/2019 ----------------------------------------------------------------------  OBSTETRICS REPORT                       (Signed Final 08/13/2019 11:18 am) ---------------------------------------------------------------------- Patient Info  ID #:       [id number]                          D.O.B.:  Dec 16, 1995 (24 yrs)  Name:       Alysandra A Repass                Visit Date: 08/13/2019 08:34 am ---------------------------------------------------------------------- Performed By  Attending:        Johnell Comings MD         Secondary Phy.:   [facility]                                                             for Women  Performed By:     Rodrigo Ran BS      Address:          [address]                                                             [place], [place]  Referred By:      Eduard Clos                Location:         [facility]                               [facility]  Ref. Address:     [place]                    [place], [place] ---------------------------------------------------------------------- Orders  #  Description                           Code        Ordered By  1  [place] MFM FETAL BPP WO NON               76819.01    YU FANG     STRESS  2  [place] MFM OB FOLLOW UP                   76816.01    YU FANG  3  [place] MFM OB FOLLOW UP ADDL              67014.10    YU FANG     GEST  4  [place] MFM FETAL BPP WO NST               30131.4     YU FANG     ADDL GESTATION  5  [place] MFM UA CORD DOPPLER                76820.02    YU FANG  6  [place] MFM UA DOPPLER ADDL                76820.03    YU FANG     GEST RE EVAL ----------------------------------------------------------------------  #  Order #                      Accession #                Episode #  1  [id number]                   [id number]                 [id number]  2  [id number]                   [id number]                 [id number]  3  [id number]                   [id number]                 [id number]  4  [id number]                   [id number]                 [id number]  5  [id number]  [id number]                 [id number]  6  [id number]                   [id number]                 [id number] ---------------------------------------------------------------------- Indications  Maternal care for known or suspected poor      O36.5932  fetal growth, third trimester, fetus 2 IUGR  Twin pregnancy, mono/di, third trimester       O30.033  [redacted] weeks gestation of pregnancy                Z3A.34  Low Risk NIPS  Pregnancy comlicated by subutex                O99.320, F11.20,  maintenance, antepartum                        Z79.891  Poor obstetric history: Previous               O09.299  preeclampsia / eclampsia/gestational HTN ---------------------------------------------------------------------- Vital Signs                                                 Height:        5'2" ---------------------------------------------------------------------- Fetal Evaluation (Fetus A)  Num Of Fetuses:         2  Fetal Heart Rate(bpm):  128  Cardiac Activity:       Observed  Fetal Lie:              Maternal right side/superior NON PRES  Presentation:           Transverse, head to maternal right  Placenta:               Anterior  P. Cord Insertion:      Previously Visualized  Membrane Desc:      Dividing Membrane seen - Monochorionic  Amniotic Fluid  AFI FV:      Within normal limits                              Largest Pocket(cm)                              5.9 ---------------------------------------------------------------------- Biophysical Evaluation (Fetus A)  Amniotic F.V:   Within normal limits       F. Tone:        Observed  F. Movement:    Observed                   Score:           8/8  F. Breathing:   Observed ---------------------------------------------------------------------- Biometry (Fetus A)  BPD:      82.1  mm     G. Age:  33w 0d         15  %    CI:        70.36   %    70 - 86  FL/HC:      20.2   %    19.4 - 21.8  HC:      312.1  mm     G. Age:  35w 0d         30  %    HC/AC:      1.06        0.96 - 1.11  AC:      294.1  mm     G. Age:  33w 3d         24  %    FL/BPD:     77.0   %    71 - 87  FL:       63.2  mm     G. Age:  32w 5d          8  %    FL/AC:      21.5   %    20 - 24  HUM:      54.3  mm     G. Age:  31w 4d          5  %  Est. FW:    2167  gm    4 lb 12 oz      19  %     FW Discordancy     0 \ 15 % ---------------------------------------------------------------------- OB History  Gravidity:    3         Term:   1        Prem:   0        SAB:   0  TOP:          0       Ectopic:  0        Living: 1 ---------------------------------------------------------------------- Gestational Age (Fetus A)  LMP:           34w 2d        Date:  12/16/18                 EDD:   09/22/19  U/S Today:     33w 4d                                        EDD:   09/27/19  Best:          34w 2d     Det. By:  LMP  (12/16/18)          EDD:   09/22/19 ---------------------------------------------------------------------- Anatomy (Fetus A)  Cranium:               Previously seen        Aortic Arch:            Previously seen  Cavum:                 Previously seen        Ductal Arch:            Previously seen  Ventricles:            Appears normal         Diaphragm:              Appears normal  Choroid Plexus:        Previously seen        Stomach:  Appears normal, left                                                                        sided  Cerebellum:            Appears normal         Abdomen:                Previously seen  Posterior Fossa:       Previously seen        Abdominal Wall:         Previously seen  Nuchal Fold:            Previously seen        Cord Vessels:           Previously seen  Face:                  Orbits and profile     Kidneys:                Appear normal                         previously seen  Lips:                  Previously seen        Bladder:                Appears normal  Thoracic:              Previously seen        Spine:                  Previously seen  Heart:                 Previously seen        Upper Extremities:      Previously seen  RVOT:                  Previously seen        Lower Extremities:      Previously seen  LVOT:                  Previously seen  Other:  Heels previously visualized. Technically difficult due to fetal position. ---------------------------------------------------------------------- Doppler - Fetal Vessels (Fetus A)  Umbilical Artery   S/D     %tile                                              ADFV    RDFV   2.81       68                                                 No      No ---------------------------------------------------------------------- Fetal Evaluation (Fetus B)  Num Of Fetuses:         2  Fetal Heart Rate(bpm):  126  Cardiac Activity:       Observed  Fetal Lie:              Maternal left side  Presentation:           Breech  Placenta:               Anterior  P. Cord Insertion:      Previously Visualized  Amniotic Fluid  AFI FV:      Within normal limits                              Largest Pocket(cm)                              5.1 ---------------------------------------------------------------------- Biophysical Evaluation (Fetus B)  Amniotic F.V:   Within normal limits       F. Tone:        Observed  F. Movement:    Observed                   Score:          8/8  F. Breathing:   Observed ---------------------------------------------------------------------- Biometry (Fetus B)  BPD:        79  mm     G. Age:  31w 5d        1.9  %    CI:        68.27   %    70 - 86                                                          FL/HC:      20.1   %    19.4 - 21.8   HC:      305.7  mm     G. Age:  34w 0d         12  %    HC/AC:      1.12        0.96 - 1.11  AC:      272.7  mm     G. Age:  31w 2d        1.5  %    FL/BPD:     77.6   %    71 - 87  FL:       61.3  mm     G. Age:  31w 6d        2.2  %    FL/AC:      22.5   %    20 - 24  HUM:      53.4  mm     G. Age:  31w 1d        < 5  %  Est. FW:    1838  gm      4 lb 1 oz    2.6  %     FW Discordancy        15  % ---------------------------------------------------------------------- Gestational Age (Fetus B)  LMP:           34w 2d        Date:  12/16/18  EDD:   09/22/19  U/S Today:     32w 2d                                        EDD:   10/06/19  Best:          34w 2d     Det. By:  LMP  (12/16/18)          EDD:   09/22/19 ---------------------------------------------------------------------- Anatomy (Fetus B)  Cranium:               Appears normal         Aortic Arch:            Previously seen  Cavum:                 Appears normal         Ductal Arch:            Previously seen  Ventricles:            Previously seen        Diaphragm:              Appears normal  Choroid Plexus:        Previously seen        Stomach:                Appears normal, left                                                                        sided  Cerebellum:            Previously seen        Abdomen:                Previously seen  Posterior Fossa:       Previously seen        Abdominal Wall:         Previously seen  Nuchal Fold:           Previously seen        Cord Vessels:           Previously seen  Face:                  Orbits and profile     Kidneys:                Appear normal                         previously seen  Lips:                  Previously seen        Bladder:                Appears normal  Thoracic:              Previously seen        Spine:                  Previously seen  Heart:  Previously seen        Upper Extremities:      Previously seen  RVOT:                  Previously seen         Lower Extremities:      Previously seen  LVOT:                  Previously seen  Other:  Heels previously visualized. Technically difficult due to fetal position. ---------------------------------------------------------------------- Doppler - Fetal Vessels (Fetus B)  Umbilical Artery   S/D     %tile                                              ADFV    RDFV   2.88       72                                                 No      No ---------------------------------------------------------------------- Cervix Uterus Adnexa  Cervix  Not visualized (advanced GA >24wks)  Uterus  No abnormality visualized.  Right Ovary  Not visualized.  Left Ovary  Not visualized.  Cul De Sac  No free fluid seen.  Adnexa  No abnormality visualized. ---------------------------------------------------------------------- Comments  This patient was seen for a follow-up ultrasound exam and  umbilical artery Doppler studies due to a monochorionic,  diamniotic twin gestation.  IUGR of twin B was noted during  her last growth ultrasound.  She denies any problems since  her last exam.  The fetal growth and amniotic fluid level appeared  appropriate for her gestational age for twin A.  The overall EFW for twin B measures at the 3rd percentile for  her gestational age, indicating fetal growth restriction.  Twin B  has grown over half a pound over the past 3 weeks.  There  was normal amniotic fluid noted around twin B.  A biophysical profile performed today was 8 out of 8 for both  twin A and twin B.  Doppler studies of the umbilical arteries performed for both  fetuses today continues to show normal forward flow with  normal S/D ratios.  There were no signs of absent or reversed  end-diastolic flow noted in either fetus today.  Due to a monochorionic twin gestation with IUGR of twin B,  delivery is recommended at around 36 weeks.  As she will  require a late preterm delivery, she should receive a  complete course of antenatal corticosteroids prior  to delivery.  As both fetuses are in the breech presentations, she will most  likely require a cesarean delivery.  She will return in 1 week for another ultrasound exam. ----------------------------------------------------------------------                   Johnell Comings, MD Electronically Signed Final Report   08/13/2019 11:18 am ----------------------------------------------------------------------  [place] MFM OB FOLLOW UP ADDL GEST  Result Date: 07/26/2019 ----------------------------------------------------------------------  OBSTETRICS REPORT                       (Signed Final 07/26/2019 05:32 pm) ---------------------------------------------------------------------- Patient Info  ID #:  [id number]                          D.O.B.:  27-Jan-1995 (24 yrs)  Name:       Tonilynn A Portela                Visit Date: 07/26/2019 02:52 pm ---------------------------------------------------------------------- Performed By  Attending:        Johnell Comings MD         Secondary Phy.:   [facility]                                                             for Women  Performed By:     Jacob Moores BS,       Address:          [employer]                                                             [place], [place]  Referred By:      Eduard Clos                Location:         [facility]                               [facility] at                                                             [place] for                                                             Women  Ref. Address:     [place], [place]  [address] ---------------------------------------------------------------------- Orders  #  Description                           Code        Ordered By  1  [place] MFM OB FOLLOW UP                   73428.76    RAVI SHANKAR  2  [place]  MFM OB FOLLOW UP ADDL              81157.26    RAVI SHANKAR     GEST  3  [place] MFM UA CORD DOPPLER                76820.02    RAVI SHANKAR  4  [place] MFM UA ADDL GEST                   76820.01    RAVI SHANKAR ----------------------------------------------------------------------  #  Order #                     Accession #                Episode #  1  [id number]                   [id number]                 [id number]  2  [id number]                   [id number]                 [id number]  3  [id number]                   [id number]                 [id number]  4  [id number]                   [id number]                 [id number] ---------------------------------------------------------------------- Indications  Twin pregnancy, mono/di, third trimester       O30.033  Maternal care for known or suspected poor      O36.5931  fetal growth, third trimester, fetus 1 IUGR  [redacted] weeks gestation of pregnancy                Z3A.31  Low Risk NIPS  Pregnancy comlicated by subutex                O99.320, F11.20,  maintenance, antepartum                        Z79.891  Poor obstetric history: Previous               O09.299  preeclampsia / eclampsia/gestational HTN ---------------------------------------------------------------------- Vital Signs                                                 Height:        5'2" ---------------------------------------------------------------------- Fetal Evaluation (Fetus A)  Num Of Fetuses:         2  Fetal Heart Rate(bpm):  138  Cardiac Activity:  Observed  Fetal Lie:              Maternal right side/ Superior  Presentation:           Transverse, head to maternal left  Placenta:               Anterior  P. Cord Insertion:      Previously Visualized  Amniotic Fluid  AFI FV:      Within normal limits                              Largest Pocket(cm)                              3.1 ---------------------------------------------------------------------- Biometry (Fetus A)  BPD:      75.1  mm     G. Age:  30w 1d           6  %    CI:        70.95   %    70 - 86                                                          FL/HC:      20.9   %    19.1 - 21.3  HC:      284.1  mm     G. Age:  31w 1d          7  %    HC/AC:      1.02        0.96 - 1.17  AC:      279.6  mm     G. Age:  32w 0d         57  %    FL/BPD:     79.2   %    71 - 87  FL:       59.5  mm     G. Age:  31w 0d         19  %    FL/AC:      21.3   %    20 - 24  CER:      40.6  mm     G. Age:  32w 4d         52  %  LV:        3.5  mm  Est. FW:    1771  gm    3 lb 14 oz      30  %     FW Discordancy     0 \ 13 % ---------------------------------------------------------------------- OB History  Gravidity:    3         Term:   1        Prem:   0        SAB:   0  TOP:          0       Ectopic:  0        Living: 1 ---------------------------------------------------------------------- Gestational Age (Fetus A)  LMP:           31w 5d        Date:  12/16/18                 EDD:   09/22/19  U/S Today:     31w 1d                                        EDD:   09/26/19  Best:          31w 5d     Det. By:  LMP  (12/16/18)          EDD:   09/22/19 ---------------------------------------------------------------------- Anatomy (Fetus A)  Cranium:               Previously seen        Aortic Arch:            Previously seen  Cavum:                 Previously seen        Ductal Arch:            Previously seen  Ventricles:            Appears normal         Diaphragm:              Appears normal  Choroid Plexus:        Previously seen        Stomach:                Appears normal, left                                                                        sided  Cerebellum:            Appears normal         Abdomen:                Previously seen  Posterior Fossa:       Previously seen        Abdominal Wall:         Previously seen  Nuchal Fold:           Previously seen        Cord Vessels:           Previously seen  Face:                  Orbits and profile     Kidneys:                 Appear normal                         previously seen  Lips:                  Previously seen        Bladder:                Appears normal  Thoracic:              Previously seen        Spine:  Previously seen  Heart:                 Previously seen        Upper Extremities:      Previously seen  RVOT:                  Previously seen        Lower Extremities:      Previously seen  LVOT:                  Previously seen  Other:  Heels previously visualized. Technically difficult due to fetal position. ---------------------------------------------------------------------- Doppler - Fetal Vessels (Fetus A)  Umbilical Artery   S/D     %tile      RI    %tile      PI    %tile     PSV    ADFV    RDFV                                                     (cm/s)   2.79       55    0.64       61       1       68    42.23      No      No ---------------------------------------------------------------------- Fetal Evaluation (Fetus B)  Num Of Fetuses:         2  Fetal Heart Rate(bpm):  153  Cardiac Activity:       Observed  Fetal Lie:              Maternal left side/ Inferior Presenting  Presentation:           Breech  Placenta:               Anterior  P. Cord Insertion:      Previously Visualized  Amniotic Fluid  AFI FV:      Within normal limits                              Largest Pocket(cm)                              4.8 ---------------------------------------------------------------------- Biometry (Fetus B)  BPD:        74  mm     G. Age:  29w 5d        2.7  %    CI:        73.24   %    70 - 86                                                          FL/HC:      21.5   %    19.1 - 21.3  HC:      274.8  mm     G. Age:  30w 0d        < 1  %    HC/AC:  1.06        0.96 - 1.17  AC:      259.9  mm     G. Age:  30w 1d         10  %    FL/BPD:     79.7   %    71 - 87  FL:         59  mm     G. Age:  30w 5d         15  %    FL/AC:      22.7   %    20 - 24  CER:      40.1  mm     G. Age:  32w 2d         45  %   LV:        4.1  mm  Est. FW:    1547  gm      3 lb 7 oz      7  %     FW Discordancy        13  % ---------------------------------------------------------------------- Gestational Age (Fetus B)  LMP:           31w 5d        Date:  12/16/18                 EDD:   09/22/19  U/S Today:     30w 1d                                        EDD:   10/03/19  Best:          31w 5d     Det. By:  LMP  (12/16/18)          EDD:   09/22/19 ---------------------------------------------------------------------- Anatomy (Fetus B)  Cranium:               Previously seen        Aortic Arch:            Previously seen  Cavum:                 Previously seen        Ductal Arch:            Previously seen  Ventricles:            Appears normal         Diaphragm:              Appears normal  Choroid Plexus:        Previously seen        Stomach:                Appears normal, left                                                                        sided  Cerebellum:            Previously seen        Abdomen:  Previously seen  Posterior Fossa:       Previously seen        Abdominal Wall:         Previously seen  Nuchal Fold:           Previously seen        Cord Vessels:           Previously seen  Face:                  Orbits and profile     Kidneys:                Appear normal                         previously seen  Lips:                  Previously seen        Bladder:                Appears normal  Thoracic:              Previously seen        Spine:                  Previously seen  Heart:                 Previously seen        Upper Extremities:      Previously seen  RVOT:                  Previously seen        Lower Extremities:      Previously seen  LVOT:                  Previously seen  Other:  Heels previously visualized. Technically difficult due to fetal position. ---------------------------------------------------------------------- Doppler - Fetal Vessels (Fetus B)  Umbilical Artery   S/D     %tile       RI    %tile      PI    %tile     PSV    ADFV    RDFV                                                     (cm/s)   2.57       42    0.61       47    0.88       44    40.54      No      No ---------------------------------------------------------------------- Cervix Uterus Adnexa  Cervix  Not visualized (advanced GA >24wks)  Uterus  No abnormality visualized.  Right Ovary  Within normal limits. No adnexal mass visualized.  Left Ovary  Within normal limits. No adnexal mass visualized.  Cul De Sac  No free fluid seen.  Adnexa  No abnormality visualized. ---------------------------------------------------------------------- Comments  This patient was seen for a follow up growth scan due to a  monochorionic, diamniotic twin gestation.  She denies any  problems since her last exam.  She was informed that the fetal growth and amniotic fluid  level for twin A appears appropriate for her gestational age.  Fetal growth restriction is  suspected in twin B as the overall  EFW obtained today for twin B measured at the 7th  percentile for her gestational age.  There was normal  amniotic fluid noted around twin B.  Doppler studies of the umbilical arteries performed for both  fetuses today continues to show normal forward flow with a  normal S/D ratio.  There were no signs of absent or reversed  end-diastolic flow noted in either fetus today.  Due to fetal growth restriction of twin B, we will continue to  follow her with weekly fetal testing and umbilical artery  Doppler studies.  Another growth ultrasound to be scheduled  in 3 weeks.  Should fetal growth restriction continue to be noted at her  next growth ultrasound, consideration should be given to  delivery of this monochorionic twin gestation at around 36  weeks. ----------------------------------------------------------------------                   Johnell Comings, MD Electronically Signed Final Report   07/26/2019 05:32 pm  ----------------------------------------------------------------------  [place] MFM UA ADDL GEST  Result Date: 07/26/2019 ----------------------------------------------------------------------  OBSTETRICS REPORT                       (Signed Final 07/26/2019 05:32 pm) ---------------------------------------------------------------------- Patient Info  ID #:       [id number]                          D.O.B.:  [birth year] (24 yrs)  Name:       Breeann A Santucci                Visit Date: 07/26/2019 02:52 pm ---------------------------------------------------------------------- Performed By  Attending:        Johnell Comings MD         Secondary Phy.:   [facility]                                                             for Women  Performed By:     Jacob Moores BS,       Address:          [address]                                                             [place], [place]  Referred By:      Eduard Clos                Location:         [facility]                               [facility] at                                                             [place] for                                                             Women  Ref. Address:     [place]                    [place], [place] ---------------------------------------------------------------------- Orders  #  Description                           Code        Ordered By  1  [place] MFM OB FOLLOW UP                   62229.79    RAVI SHANKAR  2  [place] MFM OB FOLLOW UP ADDL              89211.94    RAVI SHANKAR     GEST  3  [place] MFM UA CORD DOPPLER                76820.02    RAVI SHANKAR  4  [place] MFM UA ADDL GEST                   76820.01    RAVI SHANKAR ----------------------------------------------------------------------  #  Order #                     Accession #                Episode  #  1  [id number]                   [id number]                 [id number]  2  [id number]                   [id number]                 [id number]  3  [id number]                   [id number]                 [id number]  4  [id number]                   [id number]  [id number] ---------------------------------------------------------------------- Indications  Twin pregnancy, mono/di, third trimester       O30.033  Maternal care for known or suspected poor      O36.5931  fetal growth, third trimester, fetus 1 IUGR  [redacted] weeks gestation of pregnancy                Z3A.31  Low Risk NIPS  Pregnancy comlicated by subutex                O99.320, F11.20,  maintenance, antepartum                        Z79.891  Poor obstetric history: Previous               O09.299  preeclampsia / eclampsia/gestational HTN ---------------------------------------------------------------------- Vital Signs                                                 Height:        5'2" ---------------------------------------------------------------------- Fetal Evaluation (Fetus A)  Num Of Fetuses:         2  Fetal Heart Rate(bpm):  138  Cardiac Activity:       Observed  Fetal Lie:              Maternal right side/ Superior  Presentation:           Transverse, head to maternal left  Placenta:               Anterior  P. Cord Insertion:      Previously Visualized  Amniotic Fluid  AFI FV:      Within normal limits                              Largest Pocket(cm)                              3.1 ---------------------------------------------------------------------- Biometry (Fetus A)  BPD:      75.1  mm     G. Age:  30w 1d          6  %    CI:        70.95   %    70 - 86                                                          FL/HC:      20.9   %    19.1 - 21.3  HC:      284.1  mm     G. Age:  31w 1d          7  %    HC/AC:      1.02        0.96 - 1.17  AC:      279.6  mm     G. Age:  32w 0d         57  %    FL/BPD:     79.2   %    71 - 87  FL:       59.5  mm      G. Age:  31w 0d         19  %    FL/AC:      21.3   %    20 - 24  CER:      40.6  mm     G. Age:  32w 4d         52  %  LV:        3.5  mm  Est. FW:    1771  gm    3 lb 14 oz      30  %     FW Discordancy     0 \ 13 % ---------------------------------------------------------------------- OB History  Gravidity:    3         Term:   1        Prem:   0        SAB:   0  TOP:          0       Ectopic:  0        Living: 1 ---------------------------------------------------------------------- Gestational Age (Fetus A)  LMP:           31w 5d        Date:  12/16/18                 EDD:   09/22/19  U/S Today:     31w 1d                                        EDD:   09/26/19  Best:          31w 5d     Det. By:  LMP  (12/16/18)          EDD:   09/22/19 ---------------------------------------------------------------------- Anatomy (Fetus A)  Cranium:               Previously seen        Aortic Arch:            Previously seen  Cavum:                 Previously seen        Ductal Arch:            Previously seen  Ventricles:            Appears normal         Diaphragm:              Appears normal  Choroid Plexus:        Previously seen        Stomach:                Appears normal, left                                                                        sided  Cerebellum:            Appears normal         Abdomen:  Previously seen  Posterior Fossa:       Previously seen        Abdominal Wall:         Previously seen  Nuchal Fold:           Previously seen        Cord Vessels:           Previously seen  Face:                  Orbits and profile     Kidneys:                Appear normal                         previously seen  Lips:                  Previously seen        Bladder:                Appears normal  Thoracic:              Previously seen        Spine:                  Previously seen  Heart:                 Previously seen        Upper Extremities:      Previously seen  RVOT:                   Previously seen        Lower Extremities:      Previously seen  LVOT:                  Previously seen  Other:  Heels previously visualized. Technically difficult due to fetal position. ---------------------------------------------------------------------- Doppler - Fetal Vessels (Fetus A)  Umbilical Artery   S/D     %tile      RI    %tile      PI    %tile     PSV    ADFV    RDFV                                                     (cm/s)   2.79       55    0.64       61       1       68    42.23      No      No ---------------------------------------------------------------------- Fetal Evaluation (Fetus B)  Num Of Fetuses:         2  Fetal Heart Rate(bpm):  153  Cardiac Activity:       Observed  Fetal Lie:              Maternal left side/ Inferior Presenting  Presentation:           Breech  Placenta:               Anterior  P. Cord Insertion:      Previously Visualized  Amniotic Fluid  AFI FV:      Within normal limits  Largest Pocket(cm)                              4.8 ---------------------------------------------------------------------- Biometry (Fetus B)  BPD:        74  mm     G. Age:  29w 5d        2.7  %    CI:        73.24   %    70 - 86                                                          FL/HC:      21.5   %    19.1 - 21.3  HC:      274.8  mm     G. Age:  30w 0d        < 1  %    HC/AC:      1.06        0.96 - 1.17  AC:      259.9  mm     G. Age:  30w 1d         10  %    FL/BPD:     79.7   %    71 - 87  FL:         59  mm     G. Age:  30w 5d         15  %    FL/AC:      22.7   %    20 - 24  CER:      40.1  mm     G. Age:  32w 2d         45  %  LV:        4.1  mm  Est. FW:    1547  gm      3 lb 7 oz      7  %     FW Discordancy        13  % ---------------------------------------------------------------------- Gestational Age (Fetus B)  LMP:           31w 5d        Date:  12/16/18                 EDD:   09/22/19  U/S Today:     30w 1d                                         EDD:   10/03/19  Best:          31w 5d     Det. By:  LMP  (12/16/18)          EDD:   09/22/19 ---------------------------------------------------------------------- Anatomy (Fetus B)  Cranium:               Previously seen        Aortic Arch:            Previously seen  Cavum:                 Previously seen  Ductal Arch:            Previously seen  Ventricles:            Appears normal         Diaphragm:              Appears normal  Choroid Plexus:        Previously seen        Stomach:                Appears normal, left                                                                        sided  Cerebellum:            Previously seen        Abdomen:                Previously seen  Posterior Fossa:       Previously seen        Abdominal Wall:         Previously seen  Nuchal Fold:           Previously seen        Cord Vessels:           Previously seen  Face:                  Orbits and profile     Kidneys:                Appear normal                         previously seen  Lips:                  Previously seen        Bladder:                Appears normal  Thoracic:              Previously seen        Spine:                  Previously seen  Heart:                 Previously seen        Upper Extremities:      Previously seen  RVOT:                  Previously seen        Lower Extremities:      Previously seen  LVOT:                  Previously seen  Other:  Heels previously visualized. Technically difficult due to fetal position. ---------------------------------------------------------------------- Doppler - Fetal Vessels (Fetus B)  Umbilical Artery   S/D     %tile      RI    %tile      PI    %tile     PSV    ADFV    RDFV                                                     (  cm/s)   2.57       42    0.61       47    0.88       44    40.54      No      No ---------------------------------------------------------------------- Cervix Uterus Adnexa  Cervix  Not visualized (advanced GA >24wks)  Uterus  No  abnormality visualized.  Right Ovary  Within normal limits. No adnexal mass visualized.  Left Ovary  Within normal limits. No adnexal mass visualized.  Cul De Sac  No free fluid seen.  Adnexa  No abnormality visualized. ---------------------------------------------------------------------- Comments  This patient was seen for a follow up growth scan due to a  monochorionic, diamniotic twin gestation.  She denies any  problems since her last exam.  She was informed that the fetal growth and amniotic fluid  level for twin A appears appropriate for her gestational age.  Fetal growth restriction is suspected in twin B as the overall  EFW obtained today for twin B measured at the 7th  percentile for her gestational age.  There was normal  amniotic fluid noted around twin B.  Doppler studies of the umbilical arteries performed for both  fetuses today continues to show normal forward flow with a  normal S/D ratio.  There were no signs of absent or reversed  end-diastolic flow noted in either fetus today.  Due to fetal growth restriction of twin B, we will continue to  follow her with weekly fetal testing and umbilical artery  Doppler studies.  Another growth ultrasound to be scheduled  in 3 weeks.  Should fetal growth restriction continue to be noted at her  next growth ultrasound, consideration should be given to  delivery of this monochorionic twin gestation at around 36  weeks. ----------------------------------------------------------------------                   Johnell Comings, MD Electronically Signed Final Report   07/26/2019 05:32 pm ----------------------------------------------------------------------  [place] MFM UA CORD DOPPLER  Result Date: 08/24/2019 ----------------------------------------------------------------------  OBSTETRICS REPORT                       (Signed Final 08/24/2019 12:11 pm) ---------------------------------------------------------------------- Patient Info  ID #:       [id number]                           D.O.B.:  August 21, 1995 (24 yrs)  Name:       Valita A Giovanelli                Visit Date: 08/24/2019 08:57 am ---------------------------------------------------------------------- Performed By  Attending:        Johnell Comings MD         Secondary Phy.:   [facility]                                                             for Women  Performed By:     Jeanene Erb BS,      Address:          [address]  [place], [place]  Referred By:      Eduard Clos                Location:         [facility]                               [facility] at                                                             [facility] for                                                             Women  Ref. Address:     [address]                    [place], [place] ---------------------------------------------------------------------- Orders  #  Description                           Code        Ordered By  1  [place] MFM FETAL BPP WO NON               76819.01    YU FANG     STRESS  2  [place] MFM FETAL BPP WO NST               76819.1     YU FANG     ADDL GESTATION  3  [place] MFM UA CORD DOPPLER                76820.02    YU FANG  4  [place] MFM UA DOPPLER ADDL                27078.67    YU FANG     GEST RE EVAL ----------------------------------------------------------------------  #  Order #                     Accession #                Episode #  1  [id number]                   [id number]  [id number]  2  [id number]                   [id number]                 [id number]  3  [id number]                   [id number]                 [id number]  4  [id number]                   [id number]                 [id number] ---------------------------------------------------------------------- Indications  Maternal care  for known or suspected poor      O36.5932  fetal growth, third trimester, fetus 2 IUGR  Twin pregnancy, mono/di, third trimester       O30.033  Low Risk NIPS  Pregnancy comlicated by subutex                O99.320, F11.20,  maintenance, antepartum                        Z79.891  Poor obstetric history: Previous               O09.299  preeclampsia / eclampsia/gestational HTN  [redacted] weeks gestation of pregnancy                Z3A.35 ---------------------------------------------------------------------- Vital Signs                                                 Height:        5'2" ---------------------------------------------------------------------- Fetal Evaluation (Fetus A)  Num Of Fetuses:         2  Fetal Heart Rate(bpm):  144  Cardiac Activity:       Observed  Fetal Lie:              Maternal right side NOT PRESENTING TWIN  Presentation:           Cephalic  Placenta:               Anterior  P. Cord Insertion:      Previously Visualized  Membrane Desc:      Dividing Membrane seen - Monochorionic  Amniotic Fluid  AFI FV:      Within normal limits                              Largest Pocket(cm)                              2.1 ---------------------------------------------------------------------- Biophysical Evaluation (Fetus A)  Amniotic F.V:   Pocket => 2 cm             F. Tone:        Observed  F. Movement:    Observed                   Score:          8/8  F. Breathing:   Observed ---------------------------------------------------------------------- OB History  Gravidity:    3         Term:  1        Prem:   0        SAB:   0  TOP:          0       Ectopic:  0        Living: 1 ---------------------------------------------------------------------- Gestational Age (Fetus A)  LMP:           35w 6d        Date:  12/16/18                 EDD:   09/22/19  Best:          Barbie Haggis 6d     Det. By:  LMP  (12/16/18)          EDD:   09/22/19 ---------------------------------------------------------------------- Doppler - Fetal  Vessels (Fetus A)  Umbilical Artery   S/D     %tile      RI    %tile      PI    %tile     PSV    ADFV    RDFV                                                     (cm/s)   2.46       53    0.59       58    0.89       63    37.35      No      No ---------------------------------------------------------------------- Fetal Evaluation (Fetus B)  Num Of Fetuses:         2  Fetal Heart Rate(bpm):  140  Cardiac Activity:       Observed  Fetal Lie:              Maternal left side  Presentation:           Breech  Placenta:               Anterior  P. Cord Insertion:      Previously Visualized  Membrane Desc:      Dividing Membrane seen - Monochorionic  Amniotic Fluid  AFI FV:      Subjectively decreased                              Largest Pocket(cm)                              1.1 ---------------------------------------------------------------------- Biophysical Evaluation (Fetus B)  Amniotic F.V:   Oligohydramnios            F. Tone:        Observed  F. Movement:    Observed                   Score:          6/8  F. Breathing:   Observed ---------------------------------------------------------------------- Gestational Age (Fetus B)  LMP:           35w 6d        Date:  12/16/18                 EDD:   09/22/19  Best:  35w 6d     Det. By:  LMP  (12/16/18)          EDD:   09/22/19 ---------------------------------------------------------------------- Doppler - Fetal Vessels (Fetus B)  Umbilical Artery   S/D     %tile      RI    %tile      PI    %tile     PSV    ADFV    RDFV                                                     (cm/s)   3.12       86    0.68       90    1.09       91    33.39      No      No ---------------------------------------------------------------------- Comments  This patient was seen for umbilical artery Doppler studies and  biophysical profile due to a monochorionic, diamniotic twin  gestation.  IUGR of twin B was noted during her last growth  ultrasound.  She denies any problems since her last  exam.  The growth restricted fetus, twin B, is now the presenting  fetus.  A biophysical profile performed today was 8 out of 8 for both  twin A.  There was normal amniotic fluid noted around twin A.  A biophysical profile performed for twin B was 6 out of 8.  Twin B received a -2 due to oligohydramnios.  Doppler studies of the umbilical arteries performed for both  fetuses today continues to show normal forward flow with  normal S/D ratios.  There were no signs of absent or reversed  end-diastolic flow noted in either fetus today.  Due to oligohydramnios and IUGR of twin B in this  monochorionic, diamniotic twin gestation, the patient was  sent to the hospital to receive a complete course of antenatal  corticosteroids and delivery.  The patient will be admitted to [facility] specialty care for  monitoring until she receives a complete course of steroids.  She will be delivered once she receives the complete course  of steroids.  As the presenting fetus (twin B) is breech, she will most likely  require a cesarean delivery.  The patient understands that  due to the smaller size of the fetuses, there is a high  likelihood that her babies may require a NICU admission after  delivery. ----------------------------------------------------------------------                   Johnell Comings, MD Electronically Signed Final Report   08/24/2019 12:11 pm ----------------------------------------------------------------------  [place] MFM UA CORD DOPPLER  Result Date: 08/13/2019 ----------------------------------------------------------------------  OBSTETRICS REPORT                       (Signed Final 08/13/2019 11:18 am) ---------------------------------------------------------------------- Patient Info  ID #:       [id number]                          D.O.B.:  19-Dec-1995 (24 yrs)  Name:       Jeweline A Lague                Visit Date: 08/13/2019 08:34 am ---------------------------------------------------------------------- Performed By   Attending:  Johnell Comings MD         Secondary Phy.:   [facility]                                                             for Women  Performed By:     Rodrigo Ran BS      Address:          [address]                                                             [place], [place]  Referred By:      Eduard Clos                Location:         [facility]                               [facility] at                                                             [place] for                                                             Women  Ref. Address:     [place], [place] ---------------------------------------------------------------------- Orders  #  Description                           Code        Ordered By  1  [place] MFM FETAL BPP WO NON  00712.19    YU FANG     STRESS  2  [place] MFM OB FOLLOW UP                   76816.01    YU FANG  3  [place] MFM OB FOLLOW UP ADDL              75883.25    YU FANG     GEST  4  [place] MFM FETAL BPP WO NST               76819.1     YU FANG     ADDL GESTATION  5  [place] MFM UA CORD DOPPLER                76820.02    YU FANG  6  [place] MFM UA DOPPLER ADDL                76820.03    YU FANG     GEST RE EVAL ----------------------------------------------------------------------  #  Order #                     Accession #                Episode #  1  [id number]                   [id number]                 [id number]  2  [id number]                   [id number]                 [id number]  3  [id number]                   [id number]                 [id number]  4  [id number]                   [id number]                 [id number]  5  [id number]                   [id number]                 [id number]  6  [id number]                   [id number]                 [id number]  ---------------------------------------------------------------------- Indications  Maternal care for known or suspected poor      O36.5932  fetal growth, third trimester, fetus 2 IUGR  Twin pregnancy, mono/di, third trimester       O30.033  [redacted] weeks gestation of pregnancy                Z3A.34  Low Risk NIPS  Pregnancy comlicated by subutex                O99.320, F11.20,  maintenance, antepartum                        Z79.891  Poor obstetric history: Previous               O09.299  preeclampsia / eclampsia/gestational HTN ----------------------------------------------------------------------  Vital Signs                                                 Height:        5'2" ---------------------------------------------------------------------- Fetal Evaluation (Fetus A)  Num Of Fetuses:         2  Fetal Heart Rate(bpm):  128  Cardiac Activity:       Observed  Fetal Lie:              Maternal right side/superior NON PRES  Presentation:           Transverse, head to maternal right  Placenta:               Anterior  P. Cord Insertion:      Previously Visualized  Membrane Desc:      Dividing Membrane seen - Monochorionic  Amniotic Fluid  AFI FV:      Within normal limits                              Largest Pocket(cm)                              5.9 ---------------------------------------------------------------------- Biophysical Evaluation (Fetus A)  Amniotic F.V:   Within normal limits       F. Tone:        Observed  F. Movement:    Observed                   Score:          8/8  F. Breathing:   Observed ---------------------------------------------------------------------- Biometry (Fetus A)  BPD:      82.1  mm     G. Age:  33w 0d         15  %    CI:        70.36   %    70 - 86                                                          FL/HC:      20.2   %    19.4 - 21.8  HC:      312.1  mm     G. Age:  35w 0d         30  %    HC/AC:      1.06        0.96 - 1.11  AC:      294.1  mm     G. Age:  33w 3d         29  %     FL/BPD:     77.0   %    71 - 87  FL:       63.2  mm     G. Age:  32w 5d          8  %    FL/AC:      21.5   %    20 - 24  HUM:  54.3  mm     G. Age:  31w 4d          5  %  Est. FW:    2167  gm    4 lb 12 oz      19  %     FW Discordancy     0 \ 15 % ---------------------------------------------------------------------- OB History  Gravidity:    3         Term:   1        Prem:   0        SAB:   0  TOP:          0       Ectopic:  0        Living: 1 ---------------------------------------------------------------------- Gestational Age (Fetus A)  LMP:           34w 2d        Date:  12/16/18                 EDD:   09/22/19  U/S Today:     33w 4d                                        EDD:   09/27/19  Best:          34w 2d     Det. By:  LMP  (12/16/18)          EDD:   09/22/19 ---------------------------------------------------------------------- Anatomy (Fetus A)  Cranium:               Previously seen        Aortic Arch:            Previously seen  Cavum:                 Previously seen        Ductal Arch:            Previously seen  Ventricles:            Appears normal         Diaphragm:              Appears normal  Choroid Plexus:        Previously seen        Stomach:                Appears normal, left                                                                        sided  Cerebellum:            Appears normal         Abdomen:                Previously seen  Posterior Fossa:       Previously seen        Abdominal Wall:         Previously seen  Nuchal Fold:           Previously seen  Cord Vessels:           Previously seen  Face:                  Orbits and profile     Kidneys:                Appear normal                         previously seen  Lips:                  Previously seen        Bladder:                Appears normal  Thoracic:              Previously seen        Spine:                  Previously seen  Heart:                 Previously seen        Upper Extremities:      Previously  seen  RVOT:                  Previously seen        Lower Extremities:      Previously seen  LVOT:                  Previously seen  Other:  Heels previously visualized. Technically difficult due to fetal position. ---------------------------------------------------------------------- Doppler - Fetal Vessels (Fetus A)  Umbilical Artery   S/D     %tile                                              ADFV    RDFV   2.81       68                                                 No      No ---------------------------------------------------------------------- Fetal Evaluation (Fetus B)  Num Of Fetuses:         2  Fetal Heart Rate(bpm):  126  Cardiac Activity:       Observed  Fetal Lie:              Maternal left side  Presentation:           Breech  Placenta:               Anterior  P. Cord Insertion:      Previously Visualized  Amniotic Fluid  AFI FV:      Within normal limits                              Largest Pocket(cm)                              5.1 ---------------------------------------------------------------------- Biophysical Evaluation (Fetus B)  Amniotic F.V:   Within normal limits  F. Tone:        Observed  F. Movement:    Observed                   Score:          8/8  F. Breathing:   Observed ---------------------------------------------------------------------- Biometry (Fetus B)  BPD:        79  mm     G. Age:  31w 5d        1.9  %    CI:        68.27   %    70 - 86                                                          FL/HC:      20.1   %    19.4 - 21.8  HC:      305.7  mm     G. Age:  34w 0d         12  %    HC/AC:      1.12        0.96 - 1.11  AC:      272.7  mm     G. Age:  31w 2d        1.5  %    FL/BPD:     77.6   %    71 - 87  FL:       61.3  mm     G. Age:  31w 6d        2.2  %    FL/AC:      22.5   %    20 - 24  HUM:      53.4  mm     G. Age:  31w 1d        < 5  %  Est. FW:    1838  gm      4 lb 1 oz    2.6  %     FW Discordancy        15  %  ---------------------------------------------------------------------- Gestational Age (Fetus B)  LMP:           34w 2d        Date:  12/16/18                 EDD:   09/22/19  U/S Today:     32w 2d                                        EDD:   10/06/19  Best:          34w 2d     Det. By:  LMP  (12/16/18)          EDD:   09/22/19 ---------------------------------------------------------------------- Anatomy (Fetus B)  Cranium:               Appears normal         Aortic Arch:            Previously seen  Cavum:                 Appears normal  Ductal Arch:            Previously seen  Ventricles:            Previously seen        Diaphragm:              Appears normal  Choroid Plexus:        Previously seen        Stomach:                Appears normal, left                                                                        sided  Cerebellum:            Previously seen        Abdomen:                Previously seen  Posterior Fossa:       Previously seen        Abdominal Wall:         Previously seen  Nuchal Fold:           Previously seen        Cord Vessels:           Previously seen  Face:                  Orbits and profile     Kidneys:                Appear normal                         previously seen  Lips:                  Previously seen        Bladder:                Appears normal  Thoracic:              Previously seen        Spine:                  Previously seen  Heart:                 Previously seen        Upper Extremities:      Previously seen  RVOT:                  Previously seen        Lower Extremities:      Previously seen  LVOT:                  Previously seen  Other:  Heels previously visualized. Technically difficult due to fetal position. ---------------------------------------------------------------------- Doppler - Fetal Vessels (Fetus B)  Umbilical Artery   S/D     %tile                                              ADFV  RDFV   2.88       72                                                  No      No ---------------------------------------------------------------------- Cervix Uterus Adnexa  Cervix  Not visualized (advanced GA >24wks)  Uterus  No abnormality visualized.  Right Ovary  Not visualized.  Left Ovary  Not visualized.  Cul De Sac  No free fluid seen.  Adnexa  No abnormality visualized. ---------------------------------------------------------------------- Comments  This patient was seen for a follow-up ultrasound exam and  umbilical artery Doppler studies due to a monochorionic,  diamniotic twin gestation.  IUGR of twin B was noted during  her last growth ultrasound.  She denies any problems since  her last exam.  The fetal growth and amniotic fluid level appeared  appropriate for her gestational age for twin A.  The overall EFW for twin B measures at the 3rd percentile for  her gestational age, indicating fetal growth restriction.  Twin B  has grown over half a pound over the past 3 weeks.  There  was normal amniotic fluid noted around twin B.  A biophysical profile performed today was 8 out of 8 for both  twin A and twin B.  Doppler studies of the umbilical arteries performed for both  fetuses today continues to show normal forward flow with  normal S/D ratios.  There were no signs of absent or reversed  end-diastolic flow noted in either fetus today.  Due to a monochorionic twin gestation with IUGR of twin B,  delivery is recommended at around 36 weeks.  As she will  require a late preterm delivery, she should receive a  complete course of antenatal corticosteroids prior to delivery.  As both fetuses are in the breech presentations, she will most  likely require a cesarean delivery.  She will return in 1 week for another ultrasound exam. ----------------------------------------------------------------------                   Johnell Comings, MD Electronically Signed Final Report   08/13/2019 11:18 am ----------------------------------------------------------------------  [place]  MFM UA CORD DOPPLER  Result Date: 08/06/2019 ----------------------------------------------------------------------  OBSTETRICS REPORT                       (Signed Final 08/06/2019 10:08 am) ---------------------------------------------------------------------- Patient Info  ID #:       [id number]                          D.O.B.:  12-Aug-1995 (24 yrs)  Name:       Christene Slates A Batzel                Visit Date: 08/06/2019 09:39 am ---------------------------------------------------------------------- Performed By  Attending:        Johnell Comings MD         Secondary Phy.:   [facility]                                                             for Women  Performed By:  Devin Vics             Address:          [address]                    [place]                                                             [place], [place]  Referred By:      Eduard Clos                Location:         [facility]                               Fetal [facility] at                                                             [place] for                                                             Women  Ref. Address:     [place], [place] ---------------------------------------------------------------------- Orders  #  Description                           Code        Ordered By  1  [place] MFM FETAL BPP WO NST               88916.9     IH WTUU     EKCM [id number]  2  [place] MFM FETAL BPP WO NON               76819.01    YU FANG     STRESS  3  [place] MFM UA CORD DOPPLER                76820.02    YU FANG  4  [place] MFM  UA DOPPLER ADDL                76820.03    YU FANG     GEST RE EVAL ----------------------------------------------------------------------  #  Order #                     Accession #                Episode #  1  [id number]                   [id number]                  [id number]  2  [id number]                   [id number]                 [id number]  3  [id number]                   [id number]                 [id number]  4  [id number]                   [id number]                 [id number] ---------------------------------------------------------------------- Indications  Maternal care for known or suspected poor      O36.5931  fetal growth, third trimester, fetus 1 IUGR  (twin B)  [redacted] weeks gestation of pregnancy                Z3A.33  Twin pregnancy, mono/di, third trimester       O30.033  Low Risk NIPS  Pregnancy comlicated by subutex                O99.320, F11.20,  maintenance, antepartum                        Z79.891  Poor obstetric history: Previous               O09.299  preeclampsia / eclampsia/gestational HTN ---------------------------------------------------------------------- Vital Signs                                                 Height:        5'2" ---------------------------------------------------------------------- Fetal Evaluation (Fetus A)  Num Of Fetuses:         2  Fetal Heart Rate(bpm):  141  Cardiac Activity:       Observed  Presentation:           Breech  Placenta:               Anterior  Amniotic Fluid  AFI FV:      Within normal limits                              Largest Pocket(cm)                              2.76 ---------------------------------------------------------------------- Biophysical Evaluation (Fetus A)  Amniotic F.V:   Within normal limits       F. Tone:  Observed  F. Movement:    Observed                   Score:          8/8  F. Breathing:   Observed ---------------------------------------------------------------------- OB History  Gravidity:    3         Term:   1        Prem:   0        SAB:   0  TOP:          0       Ectopic:  0        Living: 1 ---------------------------------------------------------------------- Gestational Age (Fetus A)  LMP:           33w 2d        Date:  12/16/18                 EDD:   09/22/19  Best:           33w 2d     Det. By:  LMP  (12/16/18)          EDD:   09/22/19 ---------------------------------------------------------------------- Doppler - Fetal Vessels (Fetus A)  Umbilical Artery   S/D     %tile                                              ADFV    RDFV   2.66       55                                                 No      No ---------------------------------------------------------------------- Fetal Evaluation (Fetus B)  Num Of Fetuses:         2  Fetal Heart Rate(bpm):  143  Cardiac Activity:       Observed  Presentation:           Transverse, head to maternal left  Placenta:               Anterior  Amniotic Fluid  AFI FV:      Within normal limits                              Largest Pocket(cm)                              3.53 ---------------------------------------------------------------------- Biophysical Evaluation (Fetus B)  Amniotic F.V:   Within normal limits       F. Tone:        Observed  F. Movement:    Observed                   Score:          8/8  F. Breathing:   Observed ---------------------------------------------------------------------- Gestational Age (Fetus B)  LMP:           33w 2d        Date:  12/16/18                 EDD:   09/22/19  Best:  33w 2d     Det. By:  LMP  (12/16/18)          EDD:   09/22/19 ---------------------------------------------------------------------- Doppler - Fetal Vessels (Fetus B)  Umbilical Artery   S/D     %tile                                              ADFV    RDFV   2.75       60                                                 No      No ---------------------------------------------------------------------- Cervix Uterus Adnexa  Cervix  Not visualized (advanced GA >24wks) ---------------------------------------------------------------------- Comments  This patient was seen for a biophysical profile and umbilical  artery Doppler studies due to a monochorionic, diamniotic  twin gestation.  IUGR of twin B was noted during her last  growth  ultrasound.  She denies any problems since her last  exam.  A biophysical profile performed today was 8 out of 8 for both  twin A and twin B.  There was normal amniotic fluid noted around both fetuses.  Doppler studies of the umbilical arteries performed for both  fetuses today continues to show normal forward flow with a  normal S/D ratio.  There were no signs of absent or reversed  end-diastolic flow noted in either fetus today.  She will return in 1 week for another ultrasound exam. ----------------------------------------------------------------------                   Johnell Comings, MD Electronically Signed Final Report   08/06/2019 10:08 am ----------------------------------------------------------------------  [place] MFM UA CORD DOPPLER  Result Date: 07/30/2019 ----------------------------------------------------------------------  OBSTETRICS REPORT                       (Signed Final 07/30/2019 06:20 pm) ---------------------------------------------------------------------- Patient Info  ID #:       [id number]                          D.O.B.:  1995-05-07 (24 yrs)  Name:       Matteson A Persky                Visit Date: 07/30/2019 08:40 am ---------------------------------------------------------------------- Performed By  Attending:        Tama High MD        Secondary Phy.:   [facility]                                                             for Women  Performed By:     Germain Osgood            Address:          [address]  [place], [place]  Referred By:      Eduard Clos                Location:         [facility]                               [facility]                                                             [facility]                                                             [facility]  Ref. Address:     [address]                    [place], [place] ---------------------------------------------------------------------- Orders  #  Description                           Code        Ordered By  1  [place] MFM FETAL BPP WO NON               76819.01    YU FANG     STRESS  2  [place] MFM FETAL BPP WO NST               76819.1     YU FANG     ADDL GESTATION  3  [place] MFM UA CORD DOPPLER                76820.02    YU FANG  4  [place] MFM UA DOPPLER ADDL                40352.48    YU FANG     GEST RE EVAL ----------------------------------------------------------------------  #  Order #                     Accession #                Episode #  1  [id number]                   [id number]  [id number]  2  [id number]                   [id number]                 [id number]  3  [id number]                   [id number]                 [id number]  4  [id number]                   [id number]                 [id number] ---------------------------------------------------------------------- Indications  [redacted] weeks gestation of pregnancy                Z3A.54  Twin pregnancy, mono/di, third trimester       O30.033  Maternal care for known or suspected poor      O36.5931  fetal growth, third trimester, fetus 1 IUGR  (twin B)  Low Risk NIPS  Pregnancy comlicated by subutex                O99.320, F11.20,  maintenance, antepartum                        Z79.891  Poor obstetric history: Previous               O09.299  preeclampsia / eclampsia/gestational HTN ---------------------------------------------------------------------- Vital Signs                                                 Height:        5'2" ---------------------------------------------------------------------- Fetal Evaluation (Fetus A)  Num Of Fetuses:         2  Fetal Heart Rate(bpm):  159  Cardiac Activity:       Observed  Fetal Lie:              Maternal right side  Presentation:           Cephalic  Placenta:               Anterior  P. Cord Insertion:      Previously  Visualized  Amniotic Fluid  AFI FV:      Within normal limits                              Largest Pocket(cm)                              3.7 ---------------------------------------------------------------------- Biophysical Evaluation (Fetus A)  Amniotic F.V:   Pocket => 2 cm             F. Tone:        Observed  F. Movement:    Observed                   Score:          8/8  F. Breathing:   Observed ---------------------------------------------------------------------- OB History  Gravidity:    3         Term:   1        Prem:  0        SAB:   0  TOP:          0       Ectopic:  0        Living: 1 ---------------------------------------------------------------------- Gestational Age (Fetus A)  LMP:           32w 2d        Date:  12/16/18                 EDD:   09/22/19  Best:          Milderd Meager 2d     Det. By:  LMP  (12/16/18)          EDD:   09/22/19 ---------------------------------------------------------------------- Anatomy (Fetus A)  Ventricles:            Previously seen        Kidneys:                Appear normal  Diaphragm:             Appears normal         Bladder:                Appears normal  Stomach:               Appears normal, left                         sided ---------------------------------------------------------------------- Doppler - Fetal Vessels (Fetus A)  Umbilical Artery   S/D     %tile      RI    %tile      PI    %tile            ADFV    RDFV   2.55       43    0.61       51    0.93       57               No      No ---------------------------------------------------------------------- Fetal Evaluation (Fetus B)  Num Of Fetuses:         2  Fetal Heart Rate(bpm):  150  Cardiac Activity:       Observed  Fetal Lie:              Maternal left side  Presentation:           Breech  Placenta:               Anterior  P. Cord Insertion:      Previously Visualized  Amniotic Fluid  AFI FV:      Within normal limits                              Largest Pocket(cm)                              3.8  ---------------------------------------------------------------------- Biophysical Evaluation (Fetus B)  Amniotic F.V:   Pocket => 2 cm             F. Tone:        Observed  F. Movement:    Observed                   Score:  8/8  F. Breathing:   Observed ---------------------------------------------------------------------- Gestational Age (Fetus B)  LMP:           32w 2d        Date:  12/16/18                 EDD:   09/22/19  Best:          Milderd Meager 2d     Det. By:  LMP  (12/16/18)          EDD:   09/22/19 ---------------------------------------------------------------------- Anatomy (Fetus B)  Ventricles:            Previously seen        Kidneys:                Appear normal  Diaphragm:             Appears normal         Bladder:                Appears normal  Stomach:               Appears normal, left                         sided ---------------------------------------------------------------------- Doppler - Fetal Vessels (Fetus B)  Umbilical Artery   S/D     %tile      RI    %tile      PI    %tile            ADFV    RDFV   2.73       55    0.63       60    0.93       57               No      No ---------------------------------------------------------------------- Cervix Uterus Adnexa  Cervix  Not visualized (advanced GA >24wks) ---------------------------------------------------------------------- Impression  Monochorionic-diamniotic twin pregnancy with selective fetal  growth restriction in twin B.  Patient return for BPP and umbilical artery Doppler studies.  Twin A: Maternal right, anterior placenta, cephalic  presentation.Amniotic fluid is normal and good fetal activity is  seen. Umbilical artery Doppler showed normal forward  diastolic flow .anAntenatal testing is reassuring. BPP 8/8.  Twin B: Maternal left, anterior placenta, breech  presentation.Amniotic fluid is normal and good fetal activity is  seen .Antenatal testing is reassuring. BPP 8/8. Umbilical  artery Doppler showed normal forward  diastolic flow .  We reassured the patient of the findings. ---------------------------------------------------------------------- Recommendations  -Continue weekly BPP till delivery.  -If growth restriction persists, delivery may be considered at  6 to [redacted] weeks gestation. ----------------------------------------------------------------------                  Tama High, MD Electronically Signed Final Report   07/30/2019 06:20 pm ----------------------------------------------------------------------  [place] MFM UA CORD DOPPLER  Result Date: 07/26/2019 ----------------------------------------------------------------------  OBSTETRICS REPORT                       (Signed Final 07/26/2019 05:32 pm) ---------------------------------------------------------------------- Patient Info  ID #:       [id number]                          D.O.B.:  1995/03/04 (24 yrs)  Name:       Folashade A Schwegler  Visit Date: 07/26/2019 02:52 pm ---------------------------------------------------------------------- Performed By  Attending:        Johnell Comings MD         Secondary Phy.:   [facility]                                                             for Women  Performed By:     Jacob Moores BS,       Address:          [place], [place]                                                             [place], [place]  Referred By:      Eduard Clos                Location:         [facility]                               [facility]                                                             [place]                                                             Women  Ref. Address:     [place], [place] ---------------------------------------------------------------------- Orders  #  Description                            Code        Ordered By  1  [place]  MFM OB FOLLOW UP                   76816.01    RAVI SHANKAR  2  [place] MFM OB FOLLOW UP ADDL              03559.74    RAVI SHANKAR     GEST  3  [place] MFM UA CORD DOPPLER                76820.02    RAVI SHANKAR  4  [place] MFM UA ADDL GEST                   76820.01    RAVI SHANKAR ----------------------------------------------------------------------  #  Order #                     Accession #                Episode #  1  [id number]                   [id number]                 [id number]  2  [id number]                   [id number]                 [id number]  3  [id number]                   [id number]                 [id number]  4  [id number]                   [id number]                 [id number] ---------------------------------------------------------------------- Indications  Twin pregnancy, mono/di, third trimester       O30.033  Maternal care for known or suspected poor      O36.5931  fetal growth, third trimester, fetus 1 IUGR  [redacted] weeks gestation of pregnancy                Z3A.31  Low Risk NIPS  Pregnancy comlicated by subutex                O99.320, F11.20,  maintenance, antepartum                        Z79.891  Poor obstetric history: Previous               O09.299  preeclampsia / eclampsia/gestational HTN ---------------------------------------------------------------------- Vital Signs                                                 Height:        5'2" ---------------------------------------------------------------------- Fetal Evaluation (Fetus A)  Num Of Fetuses:         2  Fetal Heart Rate(bpm):  138  Cardiac Activity:       Observed  Fetal Lie:              Maternal right side/ Superior  Presentation:           Transverse, head to maternal left  Placenta:  Anterior  P. Cord Insertion:      Previously Visualized  Amniotic Fluid  AFI FV:      Within normal limits                              Largest Pocket(cm)                              3.1  ---------------------------------------------------------------------- Biometry (Fetus A)  BPD:      75.1  mm     G. Age:  30w 1d          6  %    CI:        70.95   %    70 - 86                                                          FL/HC:      20.9   %    19.1 - 21.3  HC:      284.1  mm     G. Age:  31w 1d          7  %    HC/AC:      1.02        0.96 - 1.17  AC:      279.6  mm     G. Age:  32w 0d         57  %    FL/BPD:     79.2   %    71 - 87  FL:       59.5  mm     G. Age:  31w 0d         19  %    FL/AC:      21.3   %    20 - 24  CER:      40.6  mm     G. Age:  32w 4d         52  %  LV:        3.5  mm  Est. FW:    1771  gm    3 lb 14 oz      30  %     FW Discordancy     0 \ 13 % ---------------------------------------------------------------------- OB History  Gravidity:    3         Term:   1        Prem:   0        SAB:   0  TOP:          0       Ectopic:  0        Living: 1 ---------------------------------------------------------------------- Gestational Age (Fetus A)  LMP:           31w 5d        Date:  12/16/18                 EDD:   09/22/19  U/S Today:     31w 1d  EDD:   09/26/19  Best:          31w 5d     Det. By:  LMP  (12/16/18)          EDD:   09/22/19 ---------------------------------------------------------------------- Anatomy (Fetus A)  Cranium:               Previously seen        Aortic Arch:            Previously seen  Cavum:                 Previously seen        Ductal Arch:            Previously seen  Ventricles:            Appears normal         Diaphragm:              Appears normal  Choroid Plexus:        Previously seen        Stomach:                Appears normal, left                                                                        sided  Cerebellum:            Appears normal         Abdomen:                Previously seen  Posterior Fossa:       Previously seen        Abdominal Wall:         Previously seen  Nuchal Fold:            Previously seen        Cord Vessels:           Previously seen  Face:                  Orbits and profile     Kidneys:                Appear normal                         previously seen  Lips:                  Previously seen        Bladder:                Appears normal  Thoracic:              Previously seen        Spine:                  Previously seen  Heart:                 Previously seen        Upper Extremities:      Previously seen  RVOT:                  Previously seen  Lower Extremities:      Previously seen  LVOT:                  Previously seen  Other:  Heels previously visualized. Technically difficult due to fetal position. ---------------------------------------------------------------------- Doppler - Fetal Vessels (Fetus A)  Umbilical Artery   S/D     %tile      RI    %tile      PI    %tile     PSV    ADFV    RDFV                                                     (cm/s)   2.79       55    0.64       61       1       68    42.23      No      No ---------------------------------------------------------------------- Fetal Evaluation (Fetus B)  Num Of Fetuses:         2  Fetal Heart Rate(bpm):  153  Cardiac Activity:       Observed  Fetal Lie:              Maternal left side/ Inferior Presenting  Presentation:           Breech  Placenta:               Anterior  P. Cord Insertion:      Previously Visualized  Amniotic Fluid  AFI FV:      Within normal limits                              Largest Pocket(cm)                              4.8 ---------------------------------------------------------------------- Biometry (Fetus B)  BPD:        74  mm     G. Age:  29w 5d        2.7  %    CI:        73.24   %    70 - 86                                                          FL/HC:      21.5   %    19.1 - 21.3  HC:      274.8  mm     G. Age:  30w 0d        < 1  %    HC/AC:      1.06        0.96 - 1.17  AC:      259.9  mm     G. Age:  30w 1d         10  %    FL/BPD:     79.7   %    71 - 87  FL:  59  mm     G. Age:  30w 5d         15  %    FL/AC:      22.7   %    20 - 24  CER:      40.1  mm     G. Age:  32w 2d         45  %  LV:        4.1  mm  Est. FW:    1547  gm      3 lb 7 oz      7  %     FW Discordancy        13  % ---------------------------------------------------------------------- Gestational Age (Fetus B)  LMP:           31w 5d        Date:  12/16/18                 EDD:   09/22/19  U/S Today:     30w 1d                                        EDD:   10/03/19  Best:          31w 5d     Det. By:  LMP  (12/16/18)          EDD:   09/22/19 ---------------------------------------------------------------------- Anatomy (Fetus B)  Cranium:               Previously seen        Aortic Arch:            Previously seen  Cavum:                 Previously seen        Ductal Arch:            Previously seen  Ventricles:            Appears normal         Diaphragm:              Appears normal  Choroid Plexus:        Previously seen        Stomach:                Appears normal, left                                                                        sided  Cerebellum:            Previously seen        Abdomen:                Previously seen  Posterior Fossa:       Previously seen        Abdominal Wall:         Previously seen  Nuchal Fold:           Previously seen        Cord Vessels:  Previously seen  Face:                  Orbits and profile     Kidneys:                Appear normal                         previously seen  Lips:                  Previously seen        Bladder:                Appears normal  Thoracic:              Previously seen        Spine:                  Previously seen  Heart:                 Previously seen        Upper Extremities:      Previously seen  RVOT:                  Previously seen        Lower Extremities:      Previously seen  LVOT:                  Previously seen  Other:  Heels previously visualized. Technically difficult due to fetal position.  ---------------------------------------------------------------------- Doppler - Fetal Vessels (Fetus B)  Umbilical Artery   S/D     %tile      RI    %tile      PI    %tile     PSV    ADFV    RDFV                                                     (cm/s)   2.57       42    0.61       47    0.88       44    40.54      No      No ---------------------------------------------------------------------- Cervix Uterus Adnexa  Cervix  Not visualized (advanced GA >24wks)  Uterus  No abnormality visualized.  Right Ovary  Within normal limits. No adnexal mass visualized.  Left Ovary  Within normal limits. No adnexal mass visualized.  Cul De Sac  No free fluid seen.  Adnexa  No abnormality visualized. ---------------------------------------------------------------------- Comments  This patient was seen for a follow up growth scan due to a  monochorionic, diamniotic twin gestation.  She denies any  problems since her last exam.  She was informed that the fetal growth and amniotic fluid  level for twin A appears appropriate for her gestational age.  Fetal growth restriction is suspected in twin B as the overall  EFW obtained today for twin B measured at the 7th  percentile for her gestational age.  There was normal  amniotic fluid noted around twin B.  Doppler studies of the umbilical arteries performed for both  fetuses today continues to show normal forward flow with a  normal S/D ratio.  There were no signs of  absent or reversed  end-diastolic flow noted in either fetus today.  Due to fetal growth restriction of twin B, we will continue to  follow her with weekly fetal testing and umbilical artery  Doppler studies.  Another growth ultrasound to be scheduled  in 3 weeks.  Should fetal growth restriction continue to be noted at her  next growth ultrasound, consideration should be given to  delivery of this monochorionic twin gestation at around 36  weeks. ----------------------------------------------------------------------                    Johnell Comings, MD Electronically Signed Final Report   07/26/2019 05:32 pm ----------------------------------------------------------------------  [place] MFM UA DOPPLER ADDL GEST RE EVAL  Result Date: 08/24/2019 ----------------------------------------------------------------------  OBSTETRICS REPORT                       (Signed Final 08/24/2019 12:11 pm) ---------------------------------------------------------------------- Patient Info  ID #:       [id number]                          D.O.B.:  04/19/1995 (24 yrs)  Name:       Sonoma A Villarruel                Visit Date: 08/24/2019 08:57 am ---------------------------------------------------------------------- Performed By  Attending:        Johnell Comings MD         Secondary Phy.:   [facility] [facility]                                                             [facility]  Performed By:     Jeanene Erb BS,      Address:          [address]                    [place], [place]  Referred By:      Eduard Clos                Location:         [facility]                               [facility]  Ref. Address:     [place], [place] ---------------------------------------------------------------------- Orders  #  Description                           Code        Ordered By  1  [place] MFM FETAL BPP WO NON               76819.01    YU FANG     STRESS  2  [place] MFM FETAL BPP WO NST               76819.1     YU FANG     ADDL GESTATION  3  [place] MFM UA CORD DOPPLER                76820.02    YU FANG  4  [place] MFM UA DOPPLER ADDL                76820.03    YU FANG     GEST RE EVAL  ----------------------------------------------------------------------  #  Order #                     Accession #                Episode #  1  [id number]                   [id number]                 [id number]  2  [id number]                   [id number]                 [id number]  3  [id number]                   [id number]                 [id number]  4  [id number]                   [id number]                 [id number] ---------------------------------------------------------------------- Indications  Maternal care for known or suspected poor      O36.5932  fetal growth, third trimester, fetus 2 IUGR  Twin pregnancy, mono/di, third trimester       O30.033  Low Risk NIPS  Pregnancy comlicated by subutex                O99.320, F11.20,  maintenance, antepartum  G29.528  Poor obstetric history: Previous               O09.299  preeclampsia / eclampsia/gestational HTN  [redacted] weeks gestation of pregnancy                Z3A.35 ---------------------------------------------------------------------- Vital Signs                                                 Height:        5'2" ---------------------------------------------------------------------- Fetal Evaluation (Fetus A)  Num Of Fetuses:         2  Fetal Heart Rate(bpm):  144  Cardiac Activity:       Observed  Fetal Lie:              Maternal right side NOT PRESENTING TWIN  Presentation:           Cephalic  Placenta:               Anterior  P. Cord Insertion:      Previously Visualized  Membrane Desc:      Dividing Membrane seen - Monochorionic  Amniotic Fluid  AFI FV:      Within normal limits                              Largest Pocket(cm)                              2.1 ---------------------------------------------------------------------- Biophysical Evaluation (Fetus A)  Amniotic F.V:   Pocket => 2 cm             F. Tone:        Observed  F. Movement:    Observed                   Score:          8/8  F. Breathing:   Observed  ---------------------------------------------------------------------- OB History  Gravidity:    3         Term:   1        Prem:   0        SAB:   0  TOP:          0       Ectopic:  0        Living: 1 ---------------------------------------------------------------------- Gestational Age (Fetus A)  LMP:           35w 6d        Date:  12/16/18                 EDD:   09/22/19  Best:          Barbie Haggis 6d     Det. By:  LMP  (12/16/18)          EDD:   09/22/19 ---------------------------------------------------------------------- Doppler - Fetal Vessels (Fetus A)  Umbilical Artery   S/D     %tile      RI    %tile      PI    %tile     PSV    ADFV    RDFV                                                     (  cm/s)   2.46       53    0.59       58    0.89       63    37.35      No      No ---------------------------------------------------------------------- Fetal Evaluation (Fetus B)  Num Of Fetuses:         2  Fetal Heart Rate(bpm):  140  Cardiac Activity:       Observed  Fetal Lie:              Maternal left side  Presentation:           Breech  Placenta:               Anterior  P. Cord Insertion:      Previously Visualized  Membrane Desc:      Dividing Membrane seen - Monochorionic  Amniotic Fluid  AFI FV:      Subjectively decreased                              Largest Pocket(cm)                              1.1 ---------------------------------------------------------------------- Biophysical Evaluation (Fetus B)  Amniotic F.V:   Oligohydramnios            F. Tone:        Observed  F. Movement:    Observed                   Score:          6/8  F. Breathing:   Observed ---------------------------------------------------------------------- Gestational Age (Fetus B)  LMP:           35w 6d        Date:  12/16/18                 EDD:   09/22/19  Best:          Barbie Haggis 6d     Det. By:  LMP  (12/16/18)          EDD:   09/22/19 ---------------------------------------------------------------------- Doppler - Fetal Vessels (Fetus B)   Umbilical Artery   S/D     %tile      RI    %tile      PI    %tile     PSV    ADFV    RDFV                                                     (cm/s)   3.12       86    0.68       90    1.09       91    33.39      No      No ---------------------------------------------------------------------- Comments  This patient was seen for umbilical artery Doppler studies and  biophysical profile due to a monochorionic, diamniotic twin  gestation.  IUGR of twin B was noted during her last growth  ultrasound.  She denies any problems since her last exam.  The growth restricted fetus, twin B, is now the presenting  fetus.  A biophysical profile performed today was 8 out of 8 for both  twin A.  There was normal amniotic fluid noted around twin A.  A biophysical profile performed for twin B was 6 out of 8.  Twin B received a -2 due to oligohydramnios.  Doppler studies of the umbilical arteries performed for both  fetuses today continues to show normal forward flow with  normal S/D ratios.  There were no signs of absent or reversed  end-diastolic flow noted in either fetus today.  Due to oligohydramnios and IUGR of twin B in this  monochorionic, diamniotic twin gestation, the patient was  sent to the hospital to receive a complete course of antenatal  corticosteroids and delivery.  The patient will be admitted to [facility] specialty care for  monitoring until she receives a complete course of steroids.  She will be delivered once she receives the complete course  of steroids.  As the presenting fetus (twin B) is breech, she will most likely  require a cesarean delivery.  The patient understands that  due to the smaller size of the fetuses, there is a high  likelihood that her babies may require a NICU admission after  delivery. ----------------------------------------------------------------------                   Johnell Comings, MD Electronically Signed Final Report   08/24/2019 12:11 pm  ----------------------------------------------------------------------  [place] MFM UA DOPPLER ADDL GEST RE EVAL  Result Date: 08/13/2019 ----------------------------------------------------------------------  OBSTETRICS REPORT                       (Signed Final 08/13/2019 11:18 am) ---------------------------------------------------------------------- Patient Info  ID #:       [id number]                          D.O.B.:  09-05-1995 (24 yrs)  Name:       Lysandra A Schedler                Visit Date: 08/13/2019 08:34 am ---------------------------------------------------------------------- Performed By  Attending:        Johnell Comings MD         Secondary Phy.:   [facility]  Performed By:     Rodrigo Ran BS      Address:          [address]                                                             [place], [place]  [address]  Referred By:      Eduard Clos                Location:         [facility]                               [facility] at                                                             [place] [facility]                                                             [facility]  Ref. Address:     [place], [place] ---------------------------------------------------------------------- Orders  #  Description                           Code        Ordered By  1  [place] MFM FETAL BPP WO NON               76819.01    YU FANG     STRESS  2  [place] MFM OB FOLLOW UP                   76816.01    YU FANG  3  [place] MFM OB FOLLOW UP ADDL              16109.60    YU FANG     GEST  4  [place] MFM FETAL BPP WO NST               45409.8     YU FANG     ADDL GESTATION  5  [place] MFM UA CORD DOPPLER                76820.02    YU FANG  6  [place] MFM UA DOPPLER ADDL                11914.78     YU FANG     GEST RE EVAL ----------------------------------------------------------------------  #  Order #                     Accession #                Episode #  1  [id number]                   [id number]  [id number]  2  [id number]                   [id number]                 [id number]  3  [id number]                   [id number]                 [id number]  4  [id number]                   [id number]                 [id number]  5  [id number]                   [id number]                 [id number]  6  [id number]                   [id number]                 [id number] ---------------------------------------------------------------------- Indications  Maternal care for known or suspected poor      O36.5932  fetal growth, third trimester, fetus 2 IUGR  Twin pregnancy, mono/di, third trimester       O30.033  [redacted] weeks gestation of pregnancy                Z3A.34  Low Risk NIPS  Pregnancy comlicated by subutex                O99.320, F11.20,  maintenance, antepartum                        Z79.891  Poor obstetric history: Previous               O09.299  preeclampsia / eclampsia/gestational HTN ---------------------------------------------------------------------- Vital Signs                                                 Height:        5'2" ---------------------------------------------------------------------- Fetal Evaluation (Fetus A)  Num Of Fetuses:         2  Fetal Heart Rate(bpm):  128  Cardiac Activity:       Observed  Fetal Lie:              Maternal right side/superior NON PRES  Presentation:           Transverse, head to maternal right  Placenta:               Anterior  P. Cord Insertion:      Previously Visualized  Membrane Desc:      Dividing Membrane seen - Monochorionic  Amniotic Fluid  AFI FV:      Within normal limits                              Largest Pocket(cm)                              5.9 ---------------------------------------------------------------------- Biophysical Evaluation (Fetus A)   Amniotic F.V:   Within normal limits  F. Tone:        Observed  F. Movement:    Observed                   Score:          8/8  F. Breathing:   Observed ---------------------------------------------------------------------- Biometry (Fetus A)  BPD:      82.1  mm     G. Age:  33w 0d         15  %    CI:        70.36   %    70 - 86                                                          FL/HC:      20.2   %    19.4 - 21.8  HC:      312.1  mm     G. Age:  35w 0d         30  %    HC/AC:      1.06        0.96 - 1.11  AC:      294.1  mm     G. Age:  33w 3d         67  %    FL/BPD:     77.0   %    71 - 87  FL:       63.2  mm     G. Age:  32w 5d          8  %    FL/AC:      21.5   %    20 - 24  HUM:      54.3  mm     G. Age:  31w 4d          5  %  Est. FW:    2167  gm    4 lb 12 oz      19  %     FW Discordancy     0 \ 15 % ---------------------------------------------------------------------- OB History  Gravidity:    3         Term:   1        Prem:   0        SAB:   0  TOP:          0       Ectopic:  0        Living: 1 ---------------------------------------------------------------------- Gestational Age (Fetus A)  LMP:           34w 2d        Date:  12/16/18                 EDD:   09/22/19  U/S Today:     33w 4d                                        EDD:   09/27/19  Best:          34w 2d     Det. By:  LMP  (12/16/18)  EDD:   09/22/19 ---------------------------------------------------------------------- Anatomy (Fetus A)  Cranium:               Previously seen        Aortic Arch:            Previously seen  Cavum:                 Previously seen        Ductal Arch:            Previously seen  Ventricles:            Appears normal         Diaphragm:              Appears normal  Choroid Plexus:        Previously seen        Stomach:                Appears normal, left                                                                        sided  Cerebellum:            Appears normal         Abdomen:                 Previously seen  Posterior Fossa:       Previously seen        Abdominal Wall:         Previously seen  Nuchal Fold:           Previously seen        Cord Vessels:           Previously seen  Face:                  Orbits and profile     Kidneys:                Appear normal                         previously seen  Lips:                  Previously seen        Bladder:                Appears normal  Thoracic:              Previously seen        Spine:                  Previously seen  Heart:                 Previously seen        Upper Extremities:      Previously seen  RVOT:                  Previously seen        Lower Extremities:      Previously seen  LVOT:                  Previously  seen  Other:  Heels previously visualized. Technically difficult due to fetal position. ---------------------------------------------------------------------- Doppler - Fetal Vessels (Fetus A)  Umbilical Artery   S/D     %tile                                              ADFV    RDFV   2.81       68                                                 No      No ---------------------------------------------------------------------- Fetal Evaluation (Fetus B)  Num Of Fetuses:         2  Fetal Heart Rate(bpm):  126  Cardiac Activity:       Observed  Fetal Lie:              Maternal left side  Presentation:           Breech  Placenta:               Anterior  P. Cord Insertion:      Previously Visualized  Amniotic Fluid  AFI FV:      Within normal limits                              Largest Pocket(cm)                              5.1 ---------------------------------------------------------------------- Biophysical Evaluation (Fetus B)  Amniotic F.V:   Within normal limits       F. Tone:        Observed  F. Movement:    Observed                   Score:          8/8  F. Breathing:   Observed ---------------------------------------------------------------------- Biometry (Fetus B)  BPD:        79  mm     G. Age:  31w 5d        1.9  %     CI:        68.27   %    70 - 86                                                          FL/HC:      20.1   %    19.4 - 21.8  HC:      305.7  mm     G. Age:  34w 0d         12  %    HC/AC:      1.12        0.96 - 1.11  AC:      272.7  mm     G. Age:  31w 2d        1.5  %    FL/BPD:  77.6   %    71 - 87  FL:       61.3  mm     G. Age:  31w 6d        2.2  %    FL/AC:      22.5   %    20 - 24  HUM:      53.4  mm     G. Age:  31w 1d        < 5  %  Est. FW:    1838  gm      4 lb 1 oz    2.6  %     FW Discordancy        15  % ---------------------------------------------------------------------- Gestational Age (Fetus B)  LMP:           34w 2d        Date:  12/16/18                 EDD:   09/22/19  U/S Today:     32w 2d                                        EDD:   10/06/19  Best:          34w 2d     Det. By:  LMP  (12/16/18)          EDD:   09/22/19 ---------------------------------------------------------------------- Anatomy (Fetus B)  Cranium:               Appears normal         Aortic Arch:            Previously seen  Cavum:                 Appears normal         Ductal Arch:            Previously seen  Ventricles:            Previously seen        Diaphragm:              Appears normal  Choroid Plexus:        Previously seen        Stomach:                Appears normal, left                                                                        sided  Cerebellum:            Previously seen        Abdomen:                Previously seen  Posterior Fossa:       Previously seen        Abdominal Wall:         Previously seen  Nuchal Fold:           Previously seen        Cord Vessels:  Previously seen  Face:                  Orbits and profile     Kidneys:                Appear normal                         previously seen  Lips:                  Previously seen        Bladder:                Appears normal  Thoracic:              Previously seen        Spine:                  Previously seen  Heart:                  Previously seen        Upper Extremities:      Previously seen  RVOT:                  Previously seen        Lower Extremities:      Previously seen  LVOT:                  Previously seen  Other:  Heels previously visualized. Technically difficult due to fetal position. ---------------------------------------------------------------------- Doppler - Fetal Vessels (Fetus B)  Umbilical Artery   S/D     %tile                                              ADFV    RDFV   2.88       72                                                 No      No ---------------------------------------------------------------------- Cervix Uterus Adnexa  Cervix  Not visualized (advanced GA >24wks)  Uterus  No abnormality visualized.  Right Ovary  Not visualized.  Left Ovary  Not visualized.  Cul De Sac  No free fluid seen.  Adnexa  No abnormality visualized. ---------------------------------------------------------------------- Comments  This patient was seen for a follow-up ultrasound exam and  umbilical artery Doppler studies due to a monochorionic,  diamniotic twin gestation.  IUGR of twin B was noted during  her last growth ultrasound.  She denies any problems since  her last exam.  The fetal growth and amniotic fluid level appeared  appropriate for her gestational age for twin A.  The overall EFW for twin B measures at the 3rd percentile for  her gestational age, indicating fetal growth restriction.  Twin B  has grown over half a pound over the past 3 weeks.  There  was normal amniotic fluid noted around twin B.  A biophysical profile performed today was 8 out of 8 for both  twin A and twin B.  Doppler studies of the umbilical arteries performed for both  fetuses today continues to show normal forward flow with  normal S/D ratios.  There were no signs of absent or reversed  end-diastolic flow noted in either fetus today.  Due to a monochorionic twin gestation with IUGR of twin B,  delivery is recommended at around 36 weeks.   As she will  require a late preterm delivery, she should receive a  complete course of antenatal corticosteroids prior to delivery.  As both fetuses are in the breech presentations, she will most  likely require a cesarean delivery.  She will return in 1 week for another ultrasound exam. ----------------------------------------------------------------------                   Johnell Comings, MD Electronically Signed Final Report   08/13/2019 11:18 am ----------------------------------------------------------------------  [place] MFM UA DOPPLER ADDL GEST RE EVAL  Result Date: 08/06/2019 ----------------------------------------------------------------------  OBSTETRICS REPORT                       (Signed Final 08/06/2019 10:08 am) ---------------------------------------------------------------------- Patient Info  ID #:       [id number]                          D.O.B.:  26-Mar-1995 (24 yrs)  Name:       Christene Slates A Wangerin                Visit Date: 08/06/2019 09:39 am ---------------------------------------------------------------------- Performed By  Attending:        Johnell Comings MD         Secondary Phy.:   [facility]                                                             [facility]  Performed By:     Corky Crafts             Address:          [address]                                                             [place], [place]  Referred By:      Eduard Clos                Location:         [facility]                               F[facility] Care  at                                                             [facility]                                                             [facility]  Ref. Address:     [place], [place] ---------------------------------------------------------------------- Orders  #  Description                            Code        Ordered By  1  [place] MFM FETAL BPP WO NST               [phone]     YU FANG     ADDL GESTATION  2  [place] MFM FETAL BPP WO NON               76819.01    YU FANG     STRESS  3  [place] MFM UA CORD DOPPLER                76820.02    YU FANG  4  [place] MFM UA DOPPLER ADDL                76820.03    YU FANG     GEST RE EVAL ----------------------------------------------------------------------  #  Order #                     Accession #                Episode #  1  [id number]                   [id number]                 [id number]  2  [id number]                   [id number]                 [id number]  3  [id number]                   [id number]                 [id number]  4  [id number]                   [id number]                 [id number] ---------------------------------------------------------------------- Indications  Maternal care for known or suspected poor      O36.5931  fetal growth, third trimester, fetus  1 IUGR  (twin B)  [redacted] weeks gestation of pregnancy                Z3A.33  Twin pregnancy, mono/di, third trimester       O30.033  Low Risk NIPS  Pregnancy comlicated by subutex                O99.320, F11.20,  maintenance, antepartum                        Z79.891  Poor obstetric history: Previous               O09.299  preeclampsia / eclampsia/gestational HTN ---------------------------------------------------------------------- Vital Signs                                                 Height:        5'2" ---------------------------------------------------------------------- Fetal Evaluation (Fetus A)  Num Of Fetuses:         2  Fetal Heart Rate(bpm):  141  Cardiac Activity:       Observed  Presentation:           Breech  Placenta:               Anterior  Amniotic Fluid  AFI FV:      Within normal limits                              Largest Pocket(cm)                              2.76 ---------------------------------------------------------------------- Biophysical Evaluation (Fetus A)   Amniotic F.V:   Within normal limits       F. Tone:        Observed  F. Movement:    Observed                   Score:          8/8  F. Breathing:   Observed ---------------------------------------------------------------------- OB History  Gravidity:    3         Term:   1        Prem:   0        SAB:   0  TOP:          0       Ectopic:  0        Living: 1 ---------------------------------------------------------------------- Gestational Age (Fetus A)  LMP:           33w 2d        Date:  12/16/18                 EDD:   09/22/19  Best:          33w 2d     Det. By:  LMP  (12/16/18)          EDD:   09/22/19 ---------------------------------------------------------------------- Doppler - Fetal Vessels (Fetus A)  Umbilical Artery   S/D     %tile  ADFV    RDFV   2.66       55                                                 No      No ---------------------------------------------------------------------- Fetal Evaluation (Fetus B)  Num Of Fetuses:         2  Fetal Heart Rate(bpm):  143  Cardiac Activity:       Observed  Presentation:           Transverse, head to maternal left  Placenta:               Anterior  Amniotic Fluid  AFI FV:      Within normal limits                              Largest Pocket(cm)                              3.53 ---------------------------------------------------------------------- Biophysical Evaluation (Fetus B)  Amniotic F.V:   Within normal limits       F. Tone:        Observed  F. Movement:    Observed                   Score:          8/8  F. Breathing:   Observed ---------------------------------------------------------------------- Gestational Age (Fetus B)  LMP:           33w 2d        Date:  12/16/18                 EDD:   09/22/19  Best:          33w 2d     Det. By:  LMP  (12/16/18)          EDD:   09/22/19 ---------------------------------------------------------------------- Doppler - Fetal Vessels (Fetus B)  Umbilical Artery   S/D      %tile                                              ADFV    RDFV   2.75       60                                                 No      No ---------------------------------------------------------------------- Cervix Uterus Adnexa  Cervix  Not visualized (advanced GA >24wks) ---------------------------------------------------------------------- Comments  This patient was seen for a biophysical profile and umbilical  artery Doppler studies due to a monochorionic, diamniotic  twin gestation.  IUGR of twin B was noted during her last  growth ultrasound.  She denies any problems since her last  exam.  A biophysical profile performed today was 8 out of 8 for both  twin A and twin B.  There was normal amniotic fluid noted around both fetuses.  Doppler studies of the umbilical arteries performed  for both  fetuses today continues to show normal forward flow with a  normal S/D ratio.  There were no signs of absent or reversed  end-diastolic flow noted in either fetus today.  She will return in 1 week for another ultrasound exam. ----------------------------------------------------------------------                   Johnell Comings, MD Electronically Signed Final Report   08/06/2019 10:08 am ----------------------------------------------------------------------  [place] MFM UA DOPPLER ADDL GEST RE EVAL  Result Date: 07/30/2019 ----------------------------------------------------------------------  OBSTETRICS REPORT                       (Signed Final 07/30/2019 06:20 pm) ---------------------------------------------------------------------- Patient Info  ID #:       [id number]                          D.O.B.:  [birth year] (24 yrs)  Name:       Anjoli A Timm                Visit Date: 07/30/2019 08:40 am ---------------------------------------------------------------------- Performed By  Attending:        Tama High MD        Secondary Phy.:   [facility]                                                             [facility]   Performed By:     Germain Osgood            Address:          [address]                                                             [place], [place]  Referred By:      Eduard Clos                Location:         [facility]                               [facility]                                                             [facility]  Ref. Address:     [place], [place] ---------------------------------------------------------------------- Orders  #  Description                           Code        Ordered By  1  [place] MFM FETAL BPP WO NON               76819.01    YU FANG     STRESS  2  [place] MFM FETAL BPP WO NST               76819.1     YU FANG     ADDL GESTATION  3  [place] MFM UA CORD DOPPLER                76820.02    YU FANG  4  [place] MFM UA DOPPLER ADDL                76820.03    YU FANG     GEST RE EVAL ----------------------------------------------------------------------  #  Order #                     Accession #                Episode #  1  [id number]                   [id number]                 [id number]  2  [id number]                   [id number]                 [id number]  3  [id number]                   [id number]                 [id number]  4  [id number]                   [id number]                 [id number] ---------------------------------------------------------------------- Indications  [redacted] weeks gestation of pregnancy                Z3A.63  Twin pregnancy, mono/di, third trimester       O30.033  Maternal care for known or suspected poor      O36.5931  fetal growth, third trimester, fetus 1 IUGR  (twin B)  Low Risk NIPS  Pregnancy comlicated by subutex                O99.320, F11.20,  maintenance, antepartum                        Z79.891  Poor obstetric history:  Previous               O09.299  preeclampsia / eclampsia/gestational HTN ---------------------------------------------------------------------- Vital Signs  Height:        5'2" ---------------------------------------------------------------------- Fetal Evaluation (Fetus A)  Num Of Fetuses:         2  Fetal Heart Rate(bpm):  159  Cardiac Activity:       Observed  Fetal Lie:              Maternal right side  Presentation:           Cephalic  Placenta:               Anterior  P. Cord Insertion:      Previously Visualized  Amniotic Fluid  AFI FV:      Within normal limits                              Largest Pocket(cm)                              3.7 ---------------------------------------------------------------------- Biophysical Evaluation (Fetus A)  Amniotic F.V:   Pocket => 2 cm             F. Tone:        Observed  F. Movement:    Observed                   Score:          8/8  F. Breathing:   Observed ---------------------------------------------------------------------- OB History  Gravidity:    3         Term:   1        Prem:   0        SAB:   0  TOP:          0       Ectopic:  0        Living: 1 ---------------------------------------------------------------------- Gestational Age (Fetus A)  LMP:           32w 2d        Date:  12/16/18                 EDD:   09/22/19  Best:          Milderd Meager 2d     Det. By:  LMP  (12/16/18)          EDD:   09/22/19 ---------------------------------------------------------------------- Anatomy (Fetus A)  Ventricles:            Previously seen        Kidneys:                Appear normal  Diaphragm:             Appears normal         Bladder:                Appears normal  Stomach:               Appears normal, left                         sided ---------------------------------------------------------------------- Doppler - Fetal Vessels (Fetus A)  Umbilical Artery   S/D     %tile      RI    %tile      PI    %tile            ADFV     RDFV  2.55       43    0.61       51    0.93       57               No      No ---------------------------------------------------------------------- Fetal Evaluation (Fetus B)  Num Of Fetuses:         2  Fetal Heart Rate(bpm):  150  Cardiac Activity:       Observed  Fetal Lie:              Maternal left side  Presentation:           Breech  Placenta:               Anterior  P. Cord Insertion:      Previously Visualized  Amniotic Fluid  AFI FV:      Within normal limits                              Largest Pocket(cm)                              3.8 ---------------------------------------------------------------------- Biophysical Evaluation (Fetus B)  Amniotic F.V:   Pocket => 2 cm             F. Tone:        Observed  F. Movement:    Observed                   Score:          8/8  F. Breathing:   Observed ---------------------------------------------------------------------- Gestational Age (Fetus B)  LMP:           32w 2d        Date:  12/16/18                 EDD:   09/22/19  Best:          Milderd Meager 2d     Det. By:  LMP  (12/16/18)          EDD:   09/22/19 ---------------------------------------------------------------------- Anatomy (Fetus B)  Ventricles:            Previously seen        Kidneys:                Appear normal  Diaphragm:             Appears normal         Bladder:                Appears normal  Stomach:               Appears normal, left                         sided ---------------------------------------------------------------------- Doppler - Fetal Vessels (Fetus B)  Umbilical Artery   S/D     %tile      RI    %tile      PI    %tile            ADFV    RDFV   2.73       55    0.63       60    0.93       57  No      No ---------------------------------------------------------------------- Cervix Uterus Adnexa  Cervix  Not visualized (advanced GA >24wks) ---------------------------------------------------------------------- Impression  Monochorionic-diamniotic twin pregnancy with  selective fetal  growth restriction in twin B.  Patient return for BPP and umbilical artery Doppler studies.  Twin A: Maternal right, anterior placenta, cephalic  presentation.Amniotic fluid is normal and good fetal activity is  seen. Umbilical artery Doppler showed normal forward  diastolic flow .anAntenatal testing is reassuring. BPP 8/8.  Twin B: Maternal left, anterior placenta, breech  presentation.Amniotic fluid is normal and good fetal activity is  seen .Antenatal testing is reassuring. BPP 8/8. Umbilical  artery Doppler showed normal forward diastolic flow .  We reassured the patient of the findings. ---------------------------------------------------------------------- Recommendations  -Continue weekly BPP till delivery.  -If growth restriction persists, delivery may be considered at  10 to [redacted] weeks gestation. ----------------------------------------------------------------------                  Tama High, MD Electronically Signed Final Report   07/30/2019 06:20 pm ----------------------------------------------------------------------  [place] MFM FETAL BPP WO NST ADDL GESTATION  Result Date: 08/24/2019 ----------------------------------------------------------------------  OBSTETRICS REPORT                       (Signed Final 08/24/2019 12:11 pm) ---------------------------------------------------------------------- Patient Info  ID #:       [id number]                          D.O.B.:  [birth year] (24 yrs)  Name:       Christene Slates A Burningham                Visit Date: 08/24/2019 08:57 am ---------------------------------------------------------------------- Performed By  Attending:        Johnell Comings MD         Secondary Phy.:   [facility]                                                             [facility]  Performed By:     Jeanene Erb BS,      Address:          [address]                    [place], [place]  Referred By:      Eduard Clos                [facility]         [facility]                               [facility]  Ref. Address:     [place], [place] ---------------------------------------------------------------------- Orders  #  Description                           Code        Ordered By  1  [place] MFM FETAL BPP WO NON               76819.01    YU FANG     STRESS  2  [place] MFM FETAL BPP WO NST               44975.3     YU FANG     ADDL GESTATION  3  [place] MFM UA CORD DOPPLER                76820.02    YU FANG  4  [place] MFM UA DOPPLER ADDL                76820.03    YU FANG     GEST RE EVAL ----------------------------------------------------------------------  #  Order #                     Accession #                Episode #  1  [id number]                   [id number]                 [id number]  2  [id number]                   [id number]                 [id number]  3  [id number]                   [id number]                 [id number]  4  [id number]                   [id number]  [id number] ---------------------------------------------------------------------- Indications  Maternal care for known or suspected poor      O36.5932  fetal growth, third trimester, fetus 2 IUGR  Twin pregnancy, mono/di, third trimester       O30.033  Low Risk NIPS  Pregnancy comlicated by subutex                O99.320, F11.20,  maintenance, antepartum                        Z79.891  Poor obstetric history: Previous               O09.299  preeclampsia / eclampsia/gestational HTN  [redacted] weeks gestation of pregnancy                Z3A.35 ---------------------------------------------------------------------- Vital Signs                                                  Height:        5'2" ---------------------------------------------------------------------- Fetal Evaluation (Fetus A)  Num Of Fetuses:         2  Fetal Heart Rate(bpm):  144  Cardiac Activity:       Observed  Fetal Lie:              Maternal right side NOT PRESENTING TWIN  Presentation:           Cephalic  Placenta:               Anterior  P. Cord Insertion:      Previously Visualized  Membrane Desc:      Dividing Membrane seen - Monochorionic  Amniotic Fluid  AFI FV:      Within normal limits                              Largest Pocket(cm)                              2.1 ---------------------------------------------------------------------- Biophysical Evaluation (Fetus A)  Amniotic F.V:   Pocket => 2 cm             F. Tone:        Observed  F. Movement:    Observed                   Score:          8/8  F. Breathing:   Observed ---------------------------------------------------------------------- OB History  Gravidity:    3         Term:   1        Prem:   0        SAB:   0  TOP:          0       Ectopic:  0        Living: 1 ---------------------------------------------------------------------- Gestational Age (Fetus A)  LMP:           35w 6d        Date:  12/16/18                 EDD:   09/22/19  Best:          Barbie Haggis 6d  Det. By:  LMP  (12/16/18)          EDD:   09/22/19 ---------------------------------------------------------------------- Doppler - Fetal Vessels (Fetus A)  Umbilical Artery   S/D     %tile      RI    %tile      PI    %tile     PSV    ADFV    RDFV                                                     (cm/s)   2.46       53    0.59       58    0.89       63    37.35      No      No ---------------------------------------------------------------------- Fetal Evaluation (Fetus B)  Num Of Fetuses:         2  Fetal Heart Rate(bpm):  140  Cardiac Activity:       Observed  Fetal Lie:              Maternal left side  Presentation:           Breech  Placenta:               Anterior  P. Cord  Insertion:      Previously Visualized  Membrane Desc:      Dividing Membrane seen - Monochorionic  Amniotic Fluid  AFI FV:      Subjectively decreased                              Largest Pocket(cm)                              1.1 ---------------------------------------------------------------------- Biophysical Evaluation (Fetus B)  Amniotic F.V:   Oligohydramnios            F. Tone:        Observed  F. Movement:    Observed                   Score:          6/8  F. Breathing:   Observed ---------------------------------------------------------------------- Gestational Age (Fetus B)  LMP:           35w 6d        Date:  12/16/18                 EDD:   09/22/19  Best:          Barbie Haggis 6d     Det. By:  LMP  (12/16/18)          EDD:   09/22/19 ---------------------------------------------------------------------- Doppler - Fetal Vessels (Fetus B)  Umbilical Artery   S/D     %tile      RI    %tile      PI    %tile     PSV    ADFV    RDFV                                                     (  cm/s)   3.12       86    0.68       90    1.09       91    33.39      No      No ---------------------------------------------------------------------- Comments  This patient was seen for umbilical artery Doppler studies and  biophysical profile due to a monochorionic, diamniotic twin  gestation.  IUGR of twin B was noted during her last growth  ultrasound.  She denies any problems since her last exam.  The growth restricted fetus, twin B, is now the presenting  fetus.  A biophysical profile performed today was 8 out of 8 for both  twin A.  There was normal amniotic fluid noted around twin A.  A biophysical profile performed for twin B was 6 out of 8.  Twin B received a -2 due to oligohydramnios.  Doppler studies of the umbilical arteries performed for both  fetuses today continues to show normal forward flow with  normal S/D ratios.  There were no signs of absent or reversed  end-diastolic flow noted in either fetus today.  Due to  oligohydramnios and IUGR of twin B in this  monochorionic, diamniotic twin gestation, the patient was  sent to the hospital to receive a complete course of antenatal  corticosteroids and delivery.  The patient will be admitted to [facility] specialty care for  monitoring until she receives a complete course of steroids.  She will be delivered once she receives the complete course  of steroids.  As the presenting fetus (twin B) is breech, she will most likely  require a cesarean delivery.  The patient understands that  due to the smaller size of the fetuses, there is a high  likelihood that her babies may require a NICU admission after  delivery. ----------------------------------------------------------------------                   Johnell Comings, MD Electronically Signed Final Report   08/24/2019 12:11 pm ----------------------------------------------------------------------  [place] MFM FETAL BPP WO NST ADDL GESTATION  Result Date: 08/13/2019 ----------------------------------------------------------------------  OBSTETRICS REPORT                       (Signed Final 08/13/2019 11:18 am) ---------------------------------------------------------------------- Patient Info  ID #:       [id number]                          D.O.B.:  [birth year] (24 yrs)  Name:       Roderick A Olesen                Visit Date: 08/13/2019 08:34 am ---------------------------------------------------------------------- Performed By  Attending:        Johnell Comings MD         Secondary Phy.:   [facility]                                                             [facility]  Performed By:     Rodrigo Ran BS      Address:          [address]  [address]                                                             [place], [place]  Referred By:      Eduard Clos                Location:         [facility]                               [facility]  [facility] at                                                             [facility]                                                             Women  Ref. Address:     [place], [place] ---------------------------------------------------------------------- Orders  #  Description                           Code        Ordered By  1  [place] MFM FETAL BPP WO NON               76819.01    YU FANG     STRESS  2  [place] MFM OB FOLLOW UP                   76816.01    YU FANG  3  [place] MFM OB FOLLOW UP ADDL              32122.48    YU FANG     GEST  4  [place] MFM FETAL BPP WO NST               25003.7     YU FANG     ADDL GESTATION  5  [place] MFM UA CORD DOPPLER  16073.71    YU FANG  6  [place] MFM UA DOPPLER ADDL                76820.03    YU FANG     GEST RE EVAL ----------------------------------------------------------------------  #  Order #                     Accession #                Episode #  1  [id number]                   [id number]                 [id number]  2  [id number]                   [id number]                 [id number]  3  [id number]                   [id number]                 [id number]  4  [id number]                   [id number]                 [id number]  5  [id number]                   [id number]                 [id number]  6  [id number]                   [id number]                 [id number] ---------------------------------------------------------------------- Indications  Maternal care for known or suspected poor      O36.5932  fetal growth, third trimester, fetus 2 IUGR  Twin pregnancy, mono/di, third trimester       O30.033  [redacted] weeks gestation of pregnancy                Z3A.34  Low Risk NIPS  Pregnancy comlicated by subutex                O99.320, F11.20,  maintenance, antepartum                        Z79.891  Poor obstetric history: Previous               O09.299  preeclampsia / eclampsia/gestational HTN  ---------------------------------------------------------------------- Vital Signs                                                 Height:        5'2" ---------------------------------------------------------------------- Fetal Evaluation (Fetus A)  Num Of Fetuses:         2  Fetal Heart Rate(bpm):  128  Cardiac Activity:       Observed  Fetal Lie:              Maternal right side/superior NON PRES  Presentation:           Transverse, head to maternal right  Placenta:  Anterior  P. Cord Insertion:      Previously Visualized  Membrane Desc:      Dividing Membrane seen - Monochorionic  Amniotic Fluid  AFI FV:      Within normal limits                              Largest Pocket(cm)                              5.9 ---------------------------------------------------------------------- Biophysical Evaluation (Fetus A)  Amniotic F.V:   Within normal limits       F. Tone:        Observed  F. Movement:    Observed                   Score:          8/8  F. Breathing:   Observed ---------------------------------------------------------------------- Biometry (Fetus A)  BPD:      82.1  mm     G. Age:  33w 0d         15  %    CI:        70.36   %    70 - 86                                                          FL/HC:      20.2   %    19.4 - 21.8  HC:      312.1  mm     G. Age:  35w 0d         30  %    HC/AC:      1.06        0.96 - 1.11  AC:      294.1  mm     G. Age:  33w 3d         58  %    FL/BPD:     77.0   %    71 - 87  FL:       63.2  mm     G. Age:  32w 5d          8  %    FL/AC:      21.5   %    20 - 24  HUM:      54.3  mm     G. Age:  31w 4d          5  %  Est. FW:    2167  gm    4 lb 12 oz      19  %     FW Discordancy     0 \ 15 % ---------------------------------------------------------------------- OB History  Gravidity:    3         Term:   1        Prem:   0        SAB:   0  TOP:          0       Ectopic:  0        Living: 1 ----------------------------------------------------------------------  Gestational Age (Fetus A)  LMP:  34w 2d        Date:  12/16/18                 EDD:   09/22/19  U/S Today:     33w 4d                                        EDD:   09/27/19  Best:          34w 2d     Det. By:  LMP  (12/16/18)          EDD:   09/22/19 ---------------------------------------------------------------------- Anatomy (Fetus A)  Cranium:               Previously seen        Aortic Arch:            Previously seen  Cavum:                 Previously seen        Ductal Arch:            Previously seen  Ventricles:            Appears normal         Diaphragm:              Appears normal  Choroid Plexus:        Previously seen        Stomach:                Appears normal, left                                                                        sided  Cerebellum:            Appears normal         Abdomen:                Previously seen  Posterior Fossa:       Previously seen        Abdominal Wall:         Previously seen  Nuchal Fold:           Previously seen        Cord Vessels:           Previously seen  Face:                  Orbits and profile     Kidneys:                Appear normal                         previously seen  Lips:                  Previously seen        Bladder:                Appears normal  Thoracic:              Previously seen  Spine:                  Previously seen  Heart:                 Previously seen        Upper Extremities:      Previously seen  RVOT:                  Previously seen        Lower Extremities:      Previously seen  LVOT:                  Previously seen  Other:  Heels previously visualized. Technically difficult due to fetal position. ---------------------------------------------------------------------- Doppler - Fetal Vessels (Fetus A)  Umbilical Artery   S/D     %tile                                              ADFV    RDFV   2.81       68                                                 No      No  ---------------------------------------------------------------------- Fetal Evaluation (Fetus B)  Num Of Fetuses:         2  Fetal Heart Rate(bpm):  126  Cardiac Activity:       Observed  Fetal Lie:              Maternal left side  Presentation:           Breech  Placenta:               Anterior  P. Cord Insertion:      Previously Visualized  Amniotic Fluid  AFI FV:      Within normal limits                              Largest Pocket(cm)                              5.1 ---------------------------------------------------------------------- Biophysical Evaluation (Fetus B)  Amniotic F.V:   Within normal limits       F. Tone:        Observed  F. Movement:    Observed                   Score:          8/8  F. Breathing:   Observed ---------------------------------------------------------------------- Biometry (Fetus B)  BPD:        79  mm     G. Age:  31w 5d        1.9  %    CI:        68.27   %    70 - 86  FL/HC:      20.1   %    19.4 - 21.8  HC:      305.7  mm     G. Age:  34w 0d         12  %    HC/AC:      1.12        0.96 - 1.11  AC:      272.7  mm     G. Age:  31w 2d        1.5  %    FL/BPD:     77.6   %    71 - 87  FL:       61.3  mm     G. Age:  31w 6d        2.2  %    FL/AC:      22.5   %    20 - 24  HUM:      53.4  mm     G. Age:  31w 1d        < 5  %  Est. FW:    1838  gm      4 lb 1 oz    2.6  %     FW Discordancy        15  % ---------------------------------------------------------------------- Gestational Age (Fetus B)  LMP:           34w 2d        Date:  12/16/18                 EDD:   09/22/19  U/S Today:     32w 2d                                        EDD:   10/06/19  Best:          34w 2d     Det. By:  LMP  (12/16/18)          EDD:   09/22/19 ---------------------------------------------------------------------- Anatomy (Fetus B)  Cranium:               Appears normal         Aortic Arch:            Previously seen  Cavum:                  Appears normal         Ductal Arch:            Previously seen  Ventricles:            Previously seen        Diaphragm:              Appears normal  Choroid Plexus:        Previously seen        Stomach:                Appears normal, left  sided  Cerebellum:            Previously seen        Abdomen:                Previously seen  Posterior Fossa:       Previously seen        Abdominal Wall:         Previously seen  Nuchal Fold:           Previously seen        Cord Vessels:           Previously seen  Face:                  Orbits and profile     Kidneys:                Appear normal                         previously seen  Lips:                  Previously seen        Bladder:                Appears normal  Thoracic:              Previously seen        Spine:                  Previously seen  Heart:                 Previously seen        Upper Extremities:      Previously seen  RVOT:                  Previously seen        Lower Extremities:      Previously seen  LVOT:                  Previously seen  Other:  Heels previously visualized. Technically difficult due to fetal position. ---------------------------------------------------------------------- Doppler - Fetal Vessels (Fetus B)  Umbilical Artery   S/D     %tile                                              ADFV    RDFV   2.88       72                                                 No      No ---------------------------------------------------------------------- Cervix Uterus Adnexa  Cervix  Not visualized (advanced GA >24wks)  Uterus  No abnormality visualized.  Right Ovary  Not visualized.  Left Ovary  Not visualized.  Cul De Sac  No free fluid seen.  Adnexa  No abnormality visualized. ---------------------------------------------------------------------- Comments  This patient was seen for a follow-up ultrasound exam and  umbilical artery Doppler studies due to a monochorionic,   diamniotic twin gestation.  IUGR of twin B was noted during  her last growth ultrasound.  She denies any problems since  her last  exam.  The fetal growth and amniotic fluid level appeared  appropriate for her gestational age for twin A.  The overall EFW for twin B measures at the 3rd percentile for  her gestational age, indicating fetal growth restriction.  Twin B  has grown over half a pound over the past 3 weeks.  There  was normal amniotic fluid noted around twin B.  A biophysical profile performed today was 8 out of 8 for both  twin A and twin B.  Doppler studies of the umbilical arteries performed for both  fetuses today continues to show normal forward flow with  normal S/D ratios.  There were no signs of absent or reversed  end-diastolic flow noted in either fetus today.  Due to a monochorionic twin gestation with IUGR of twin B,  delivery is recommended at around 36 weeks.  As she will  require a late preterm delivery, she should receive a  complete course of antenatal corticosteroids prior to delivery.  As both fetuses are in the breech presentations, she will most  likely require a cesarean delivery.  She will return in 1 week for another ultrasound exam. ----------------------------------------------------------------------                   Johnell Comings, MD Electronically Signed Final Report   08/13/2019 11:18 am ----------------------------------------------------------------------  [place] MFM FETAL BPP WO NST ADDL GESTATION  Result Date: 08/06/2019 ----------------------------------------------------------------------  OBSTETRICS REPORT                       (Signed Final 08/06/2019 10:08 am) ---------------------------------------------------------------------- Patient Info  ID #:       [id number]                          D.O.B.:  [birth year] (24 yrs)  Name:       Christene Slates A Thurner                Visit Date: 08/06/2019 09:39 am ---------------------------------------------------------------------- Performed  By  Attending:        Johnell Comings MD         Secondary Phy.:   [facility]                                                             [facility]  Performed By:     Corky Crafts             Address:          [address]                    [address]                                                             [place], [place]  [address]  Referred By:      Eduard Clos                Location:         [facility]                               [facility]                                                             [facility]                                                             [facility]  Ref. Address:     [address]                    [place], [place] ---------------------------------------------------------------------- Orders  #  Description                           Code        Ordered By  1  [place] MFM FETAL BPP WO NST               53005.1     TM YTRZ     NBVA POLIDCVUD  2  [place] MFM FETAL BPP WO NON               76819.01    YU FANG     STRESS  3  [place] MFM UA CORD DOPPLER                76820.02    YU FANG  4  [place] MFM UA DOPPLER ADDL                76820.03    YU FANG     GEST RE EVAL ----------------------------------------------------------------------  #  Order #                     Accession #                Episode #  1  [id number]                   [id number]                 [id number]  2  [id number]                   [id number]                 [id number]  3  [id number]  [id number]                 [id number]  4  [id number]                   [id number]                 [id number] ---------------------------------------------------------------------- Indications  Maternal care for known or suspected poor      O36.5931  fetal growth, third trimester, fetus 1 IUGR  (twin B)  [redacted] weeks gestation of pregnancy                Z3A.33  Twin pregnancy, mono/di, third  trimester       O30.033  Low Risk NIPS  Pregnancy comlicated by subutex                O99.320, F11.20,  maintenance, antepartum                        Z79.891  Poor obstetric history: Previous               O09.299  preeclampsia / eclampsia/gestational HTN ---------------------------------------------------------------------- Vital Signs                                                 Height:        5'2" ---------------------------------------------------------------------- Fetal Evaluation (Fetus A)  Num Of Fetuses:         2  Fetal Heart Rate(bpm):  141  Cardiac Activity:       Observed  Presentation:           Breech  Placenta:               Anterior  Amniotic Fluid  AFI FV:      Within normal limits                              Largest Pocket(cm)                              2.76 ---------------------------------------------------------------------- Biophysical Evaluation (Fetus A)  Amniotic F.V:   Within normal limits       F. Tone:        Observed  F. Movement:    Observed                   Score:          8/8  F. Breathing:   Observed ---------------------------------------------------------------------- OB History  Gravidity:    3         Term:   1        Prem:   0        SAB:   0  TOP:          0       Ectopic:  0        Living: 1 ---------------------------------------------------------------------- Gestational Age (Fetus A)  LMP:           33w 2d        Date:  12/16/18                 EDD:   09/22/19  Best:  33w 2d     Det. By:  LMP  (12/16/18)          EDD:   09/22/19 ---------------------------------------------------------------------- Doppler - Fetal Vessels (Fetus A)  Umbilical Artery   S/D     %tile                                              ADFV    RDFV   2.66       55                                                 No      No ---------------------------------------------------------------------- Fetal Evaluation (Fetus B)  Num Of Fetuses:         2  Fetal Heart Rate(bpm):  143  Cardiac  Activity:       Observed  Presentation:           Transverse, head to maternal left  Placenta:               Anterior  Amniotic Fluid  AFI FV:      Within normal limits                              Largest Pocket(cm)                              3.53 ---------------------------------------------------------------------- Biophysical Evaluation (Fetus B)  Amniotic F.V:   Within normal limits       F. Tone:        Observed  F. Movement:    Observed                   Score:          8/8  F. Breathing:   Observed ---------------------------------------------------------------------- Gestational Age (Fetus B)  LMP:           33w 2d        Date:  12/16/18                 EDD:   09/22/19  Best:          33w 2d     Det. By:  LMP  (12/16/18)          EDD:   09/22/19 ---------------------------------------------------------------------- Doppler - Fetal Vessels (Fetus B)  Umbilical Artery   S/D     %tile                                              ADFV    RDFV   2.75       60                                                 No      No ---------------------------------------------------------------------- Cervix Uterus Adnexa  Cervix  Not visualized (  advanced GA >24wks) ---------------------------------------------------------------------- Comments  This patient was seen for a biophysical profile and umbilical  artery Doppler studies due to a monochorionic, diamniotic  twin gestation.  IUGR of twin B was noted during her last  growth ultrasound.  She denies any problems since her last  exam.  A biophysical profile performed today was 8 out of 8 for both  twin A and twin B.  There was normal amniotic fluid noted around both fetuses.  Doppler studies of the umbilical arteries performed for both  fetuses today continues to show normal forward flow with a  normal S/D ratio.  There were no signs of absent or reversed  end-diastolic flow noted in either fetus today.  She will return in 1 week for another ultrasound exam.  ----------------------------------------------------------------------                   Johnell Comings, MD Electronically Signed Final Report   08/06/2019 10:08 am ----------------------------------------------------------------------  [place] MFM FETAL BPP WO NST ADDL GESTATION  Result Date: 07/30/2019 ----------------------------------------------------------------------  OBSTETRICS REPORT                       (Signed Final 07/30/2019 06:20 pm) ---------------------------------------------------------------------- Patient Info  ID #:       [id number]                          D.O.B.:  07/13/1995 (24 yrs)  Name:       Lillymae A Balles                Visit Date: 07/30/2019 08:40 am ---------------------------------------------------------------------- Performed By  Attending:        Tama High MD        Secondary Phy.:   [facility]                                                             [facility]  Performed By:     Germain Osgood            Address:          [address]                                                             [place], [place]  Referred By:      Eduard Clos                Location:         [facility]  [facility] at                                                             [facility]                                                             Women  Ref. Address:     [place], [place] ---------------------------------------------------------------------- Orders  #  Description                           Code        Ordered By  1  [place] MFM FETAL BPP WO NON               76819.01    YU FANG     STRESS  2  [place] MFM FETAL BPP WO NST               14970.2     YU FANG     ADDL GESTATION  3  [place] MFM UA CORD DOPPLER                76820.02    YU FANG  4  [place] MFM UA DOPPLER ADDL                 76820.03    YU FANG     GEST RE EVAL ----------------------------------------------------------------------  #  Order #                     Accession #                Episode #  1  [id number]                   [id number]                 [id number]  2  [id number]                   [id number]                 [id number]  3  [id number]                   [id number]                 [id number]  4  [id number]                   [id number]                 [id number] ---------------------------------------------------------------------- Indications  [redacted] weeks gestation of pregnancy  Z3A.32  Twin pregnancy, mono/di, third trimester       O30.033  Maternal care for known or suspected poor      O36.5931  fetal growth, third trimester, fetus 1 IUGR  (twin B)  Low Risk NIPS  Pregnancy comlicated by subutex                O99.320, F11.20,  maintenance, antepartum                        Z79.891  Poor obstetric history: Previous               O09.299  preeclampsia / eclampsia/gestational HTN ---------------------------------------------------------------------- Vital Signs                                                 Height:        5'2" ---------------------------------------------------------------------- Fetal Evaluation (Fetus A)  Num Of Fetuses:         2  Fetal Heart Rate(bpm):  159  Cardiac Activity:       Observed  Fetal Lie:              Maternal right side  Presentation:           Cephalic  Placenta:               Anterior  P. Cord Insertion:      Previously Visualized  Amniotic Fluid  AFI FV:      Within normal limits                              Largest Pocket(cm)                              3.7 ---------------------------------------------------------------------- Biophysical Evaluation (Fetus A)  Amniotic F.V:   Pocket => 2 cm             F. Tone:        Observed  F. Movement:    Observed                   Score:          8/8  F. Breathing:   Observed  ---------------------------------------------------------------------- OB History  Gravidity:    3         Term:   1        Prem:   0        SAB:   0  TOP:          0       Ectopic:  0        Living: 1 ---------------------------------------------------------------------- Gestational Age (Fetus A)  LMP:           32w 2d        Date:  12/16/18                 EDD:   09/22/19  Best:          Milderd Meager 2d     Det. By:  LMP  (12/16/18)          EDD:   09/22/19 ---------------------------------------------------------------------- Anatomy (Fetus A)  Ventricles:  Previously seen        Kidneys:                Appear normal  Diaphragm:             Appears normal         Bladder:                Appears normal  Stomach:               Appears normal, left                         sided ---------------------------------------------------------------------- Doppler - Fetal Vessels (Fetus A)  Umbilical Artery   S/D     %tile      RI    %tile      PI    %tile            ADFV    RDFV   2.55       43    0.61       51    0.93       57               No      No ---------------------------------------------------------------------- Fetal Evaluation (Fetus B)  Num Of Fetuses:         2  Fetal Heart Rate(bpm):  150  Cardiac Activity:       Observed  Fetal Lie:              Maternal left side  Presentation:           Breech  Placenta:               Anterior  P. Cord Insertion:      Previously Visualized  Amniotic Fluid  AFI FV:      Within normal limits                              Largest Pocket(cm)                              3.8 ---------------------------------------------------------------------- Biophysical Evaluation (Fetus B)  Amniotic F.V:   Pocket => 2 cm             F. Tone:        Observed  F. Movement:    Observed                   Score:          8/8  F. Breathing:   Observed ---------------------------------------------------------------------- Gestational Age (Fetus B)  LMP:           32w 2d        Date:  12/16/18                  EDD:   09/22/19  Best:          Milderd Meager 2d     Det. By:  LMP  (12/16/18)          EDD:   09/22/19 ---------------------------------------------------------------------- Anatomy (Fetus B)  Ventricles:            Previously seen        Kidneys:                Appear normal  Diaphragm:  Appears normal         Bladder:                Appears normal  Stomach:               Appears normal, left                         sided ---------------------------------------------------------------------- Doppler - Fetal Vessels (Fetus B)  Umbilical Artery   S/D     %tile      RI    %tile      PI    %tile            ADFV    RDFV   2.73       55    0.63       60    0.93       57               No      No ---------------------------------------------------------------------- Cervix Uterus Adnexa  Cervix  Not visualized (advanced GA >24wks) ---------------------------------------------------------------------- Impression  Monochorionic-diamniotic twin pregnancy with selective fetal  growth restriction in twin B.  Patient return for BPP and umbilical artery Doppler studies.  Twin A: Maternal right, anterior placenta, cephalic  presentation.Amniotic fluid is normal and good fetal activity is  seen. Umbilical artery Doppler showed normal forward  diastolic flow .anAntenatal testing is reassuring. BPP 8/8.  Twin B: Maternal left, anterior placenta, breech  presentation.Amniotic fluid is normal and good fetal activity is  seen .Antenatal testing is reassuring. BPP 8/8. Umbilical  artery Doppler showed normal forward diastolic flow .  We reassured the patient of the findings. ---------------------------------------------------------------------- Recommendations  -Continue weekly BPP till delivery.  -If growth restriction persists, delivery may be considered at  13 to [redacted] weeks gestation. ----------------------------------------------------------------------                  Tama High, MD Electronically Signed Final Report    07/30/2019 06:20 pm ----------------------------------------------------------------------    Assessment and Plan: IUP 35 6/7 weeks Twin gestation, mono/di GDM, diet control Malpresentation   Admit to Antenatal Betamethasone x 2 doses Fetal monitoring. Will proceed toward delivery tomorrow after completion of her BMZ.  The risks of cesarean section discussed with the patient included but were not limited to: bleeding which may require transfusion or reoperation; infection which may require antibiotics; injury to bowel, bladder, ureters or other surrounding organs; injury to the fetus; need for additional procedures including hysterectomy in the event of a life-threatening hemorrhage; placental abnormalities wth subsequent pregnancies, incisional problems, thromboembolic phenomenon and other postoperative/anesthesia complications. The patient concurred with the proposed plan, giving informed written consent for the procedure.     Dsean Vantol L. Rip Harbour, MD, [facility] Attending [place], [facility]

## 2019-08-24 NOTE — Progress Notes (Signed)
Patient ID: Beth Carr, female   DOB: [birth year], 24 y.o.   MRN: [id number] Pt continues to have ut ctx and becoming more painful and uncomfortable despite pain medication. Cervical changes from closed to 3 cm over the last 4 hours. Recommend proceeding toward delivery via c section. Pt has previously been counseled on c section. Pt agrees to proceed. Nursing, OR and Anesthesia aware.

## 2019-08-24 NOTE — Anesthesia Preprocedure Evaluation (Signed)
Anesthesia Evaluation  Patient identified by MRN, date of birth, ID band Patient awake    Reviewed: Allergy & Precautions, H&P , NPO status , Patient's Chart, lab work & pertinent test results  Airway Mallampati: I  TM Distance: >3 FB Neck ROM: full    Dental no notable dental hx. (+) Teeth Intact   Pulmonary    breath sounds clear to auscultation       Cardiovascular negative cardio ROS Normal cardiovascular exam Rhythm:regular Rate:Normal     Neuro/Psych negative neurological ROS  negative psych ROS   GI/Hepatic negative GI ROS, Neg liver ROS,   Endo/Other    Renal/GU negative Renal ROS  negative genitourinary   Musculoskeletal negative musculoskeletal ROS (+)   Abdominal Normal abdominal exam  (+)   Peds  Hematology negative hematology ROS (+)   Anesthesia Other Findings       Reproductive/Obstetrics (+) Pregnancy                             Anesthesia Physical  Anesthesia Plan  ASA: II  Anesthesia Plan: Spinal   Post-op Pain Management:    Induction:   PONV Risk Score and Plan: Ondansetron, Dexamethasone and Scopolamine patch - Pre-op  Airway Management Planned: Natural Airway and Nasal Cannula  Additional Equipment: None  Intra-op Plan:   Post-operative Plan:   Informed Consent: I have reviewed the patients History and Physical, chart, labs and discussed the procedure including the risks, benefits and alternatives for the proposed anesthesia with the patient or authorized representative who has indicated his/her understanding and acceptance.     Dental Advisory Given  Plan Discussed with: CRNA  Anesthesia Plan Comments:         Anesthesia Quick Evaluation

## 2019-08-24 NOTE — Progress Notes (Signed)
I offered support to [facility] as she prepares for her C-Section tomorrow.  She is understandably nervous, but also excited.  Her boyfriend will be here after work and she is looking forward to having him here with her.  I offered listening support and ministry of presence.  Chaplain Dyanne Carrel, Bcc Pager, [phone] 4:13 PM

## 2019-08-24 NOTE — Anesthesia Postprocedure Evaluation (Signed)
Anesthesia Post Note  Patient: Beth Carr  Procedure(s) Performed: CESAREAN SECTION MULTI-GESTATIONAL (N/A Abdomen)     Patient location during evaluation: PACU Anesthesia Type: Spinal Level of consciousness: oriented and awake and alert Pain management: pain level controlled Vital Signs Assessment: post-procedure vital signs reviewed and stable Respiratory status: spontaneous breathing, respiratory function stable and nonlabored ventilation Cardiovascular status: blood pressure returned to baseline and stable Postop Assessment: no headache, no backache, no apparent nausea or vomiting and spinal receding Anesthetic complications: no   No complications documented.  Last Vitals:  Vitals:   08/24/19 2145 08/24/19 2151  BP: (!) 137/96   Pulse: (!) 58 62  Resp: (!) 21 (!) 22  Temp:    SpO2: 100% 99%    Last Pain:  Vitals:   08/24/19 2145  TempSrc:   PainSc: 8    Pain Goal: Patients Stated Pain Goal: 3 (08/24/19 2130)              Epidural/Spinal Function Cutaneous sensation: Able to Discern Pressure (08/24/19 2130), Patient able to flex knees: No (08/24/19 2130), Patient able to lift hips off bed: No (08/24/19 2130), Back pain beyond tenderness at insertion site: No (08/24/19 2130), Progressively worsening motor and/or sensory loss: No (08/24/19 2130), Bowel and/or bladder incontinence post epidural: No (08/24/19 2130)  Lucretia Kern

## 2019-08-24 NOTE — Progress Notes (Signed)
RN called Dr. Clemens Catholic about pt's pain level at 8/10. Dr. Clemens Catholic ordered Ofirmev and Roxicodne solution 5mg  after RN had given 2mg  of Diluadid and 30 ml of Toradol. Pt was still rating her pain at a 8/10. Dr suggested PCA and  RN called Dr. and Dr. Clemens Catholic agreed. Dr. Vergie Living agreed to transfer patient to [facility].

## 2019-08-24 NOTE — Consult Note (Signed)
MFM Note  This patient was seen for umbilical artery Doppler studies and biophysical profile due to a monochorionic, diamniotic twin gestation.  IUGR of twin B was noted during her last growth ultrasound.  She denies any problems since her last exam.  The growth restricted fetus, twin B, is now the presenting fetus.  The estimated fetal weight for twin A obtained 11 days ago was 4 pounds 12 ounces (19th percentile).    The estimated fetal weight for twin B obtained on the same day was 4 pounds 1 ounces (3rd percentile).  A biophysical profile performed today was 8 out of 8 for both twin A.  There was normal amniotic fluid noted around twin A.  A biophysical profile performed for twin B was 6 out of 8.  Twin B received a -2 due to oligohydramnios.  Doppler studies of the umbilical arteries performed for both fetuses today continues to show normal forward flow with normal S/D ratios.  There were no signs of absent or reversed end-diastolic flow noted in either fetus today.  Due to oligohydramnios and IUGR of twin B in this monochorionic, diamniotic twin gestation, the patient was sent to the hospital to receive a complete course of antenatal corticosteroids and delivery.    The patient will be admitted to [facility] specialty care for monitoring until she receives a complete course of steroids.  She will be delivered once she receives the complete course of steroids.    As the presenting fetus (twin B) is breech, she will most likely require a cesarean delivery.   The patient understands that due to the smaller size of the fetuses, there is a high likelihood that her babies may require a NICU admission after delivery.

## 2019-08-24 NOTE — Op Note (Addendum)
Operative Note   SURGERY DATE: 08/24/2019  PRE-OP DIAGNOSIS:  Mono-Di Twin Pregnancy at [redacted]w[redacted]d Preterm labor Breech presentation of presenting twin  POST-OP DIAGNOSIS: Same. Delivered  PROCEDURE: primary low transverse cesarean section via pfannenstiel skin incision with double layer uterine closure  SURGEON: Surgeon(s) and Role:    * [place] Bing, MD - Primary  ASSISTANT:    * Goswick, Skipper Cliche, MD  An experienced assistant was required given the standard of surgical care given the complexity of the case.  This assistant was needed for exposure, dissection, suctioning, retraction, instrument exchange, assisting with delivery with administration of fundal pressure, and for overall help during the procedure    ANESTHESIA: spinal  ESTIMATED BLOOD LOSS: 276 ml  DRAINS: 250 mL UOP via indwelling foley  TOTAL IV FLUIDS: 2550 mL crystalloid  VTE PROPHYLAXIS: SCDs to bilateral lower extremities  ANTIBIOTICS: Two grams of Cefazolin were given., within 1 hour of skin incision  SPECIMENS: placentas to pathology  COMPLICATIONS: None  INDICATIONS:  Twin Pregnancy at [redacted]w[redacted]d, preterm labor Breech presentation of twin B (presenting twin) IUGR of Twin B with BPP 6/8 on 8/20  FINDINGS: No intra-abdominal adhesions were noted. Grossly normal uterus, tubes and ovaries. Clear amniotic fluid.  Twin A: Female, Homero Fellers Breech, Apgars 9 & 9 at 1 and 5 minutes respectively; Weight 1950g  Twin B: Female, Transverse back down, Apgars 8 & 9 at 1 and 5 minutes respectively; Weight: 2060g  PROCEDURE IN DETAIL: The patient was taken to the operating room where anesthesia was administered and normal fetal heart tones were confirmed. She was then prepped and draped in the normal fashion in the dorsal supine position with a leftward tilt.  After a time out was performed, a pfannensteil skin incision was made with the scalpel and carried through to the underlying layer of fascia. The fascia was then  incised at the midline and this incision was extended laterally with the mayo scissors. Attention was turned to the superior aspect of the fascial incision which was grasped with the kocher clamps x 2, tented up and the rectus muscles were dissected off with the mayos. In a similar fashion the inferior aspect of the fascial incision was grasped with the kocher clamps, tented up and the rectus muscles dissected off with the mayo scissors. The rectus muscles were then separated in the midline and the peritoneum was entered bluntly. The bladder blade was inserted and the vesicouterine peritoneum was identified, tented up and entered with the metzenbaum scissors. This incision was extended laterally and the bladder flap was created digitally. The bladder blade was reinserted.  A low transverse hysterotomy was made with the scalpel until the endometrial cavity was breached and the amniotic sac ruptured bluntly, yielding clear amniotic fluid. This incision was extended bluntly and twin A was delivered with breech maneuvers.The cord of twin A was clamped x 2 and cut, and the infant was handed to the awaiting pediatricians, after delayed cord clamping was done for 1 minute. Twin B was then delivered using breech maneuvers, and cord of twin B was clamped x2 and cut. Twin B was then handed to the awaiting pediatricians without delay in cord clamping given baby's initial appearance.   The placenta was then gradually expressed from the uterus and then the uterus was exteriorized and cleared of all clots and debris. The hysterotomy was repaired with a running suture of 1-0 monocryl. A second imbricating layer of 1-0 monocryl suture was then placed for excellent hemostasis   The  uterus and adnexa were then returned to the abdomen, and the hysterotomy and all operative sites were reinspected and excellent hemostasis was noted after irrigation and suction of the abdomen with warm saline.  The peritoneum was closed with a  running stitch of 3-0 Vicryl. The fascia was reapproximated with 0 Vicryl in a simple running fashion bilaterally. The subcutaneous layer was then reapproximated with interrupted sutures of 2-0 plain gut, and the skin was then closed with 4-0 monocryl, in a subcuticular fashion.  The patient tolerated the procedure well. Sponge, lap, needle, and instrument counts were correct x 2. The patient was transferred to the recovery room awake, alert and breathing independently in stable condition.  Lynnda Shields, MD OB Fellow, Faculty Practice  Agree with above. I was present and scrubbed for the entire procedure.   Cornelia Copa MD Attending [facility] [employer] [facility])

## 2019-08-24 NOTE — Discharge Summary (Signed)
Postpartum Discharge Summary     Patient Name: Beth Carr DOB: [birth year] MRN: [id number]  Date of admission: 08/24/2019 Delivery date:   Jadelyn, Elks [[id number]]  08/24/2019    Geniya, [place] [[id number]]  08/24/2019   Delivering provider:    Oceana, Walthall [[id number]]  Krislyn, Donnan [[id number]]  Aletha Halim   Date of discharge: 08/27/2019  Admitting diagnosis: IUGR (intrauterine growth restriction) affecting care of mother [O36.5990] Intrauterine pregnancy: [redacted]w[redacted]d    Secondary diagnosis:  Active Problems:   IUGR (intrauterine growth restriction) affecting care of mother   Cesarean delivery delivered  Additional problems: Preterm labor on 8/20    Discharge diagnosis: Preterm Cesarean Delivery, Delivered                            Post partum procedures:none Augmentation: N/A Complications: None  Hospital course: Sceduled C/S   24y.o. yo [id number] admitted to the hospital 08/24/2019 for IUGR in setting of mono/di twins. Given preterm labor with breech presentation of presenting twin, pt was prepped for the OR. Details of operation can be found in separate operative note.  Patient had an uncomplicated postpartum course, except for elevated BP. She was discharged home on Norvasc 5 mg.  She is ambulating, tolerating a regular diet, passing flatus, and urinating well. Patient is discharged home in stable condition on  08/27/19        Newborn Data: Birth date:   TuAsiah, Browder0[[id number]]08/24/2019    TuShandi, Godfrey0[[id number]]08/24/2019   Birth time:   TuMarsela, Kuan0[[id number]]6:26 PM    TuLolita, Faulds0[[id number]]6:31 PM   Gender:   TuBrandy, Zuba0[[id number]]Female    TuAdely, Facer0[[id number]]Female   Living status:   TuJacki, Couse0[[id number]]Living    TuBemnet, Trovato0[[id number]]Living   Apgars:   TuKsenia, Kunz0[[id number]]9 [address]0[[id number]]  7  , TuCarlin, Mamone0[[id number]]9 [place][[id number]]9   Weight:   TuRuhani, Umland0[[id number]]1950 g    TuPaxton, Kanaan0[[id number]]2060 g      Magnesium Sulfate received: No BMZ received: No Rhophylac:N/A MMR:Yes T-DaP:Given prenatally Flu: N/A Transfusion:No  Physical exam  Vitals:   08/26/19 0548 08/26/19 1615 08/26/19 2214 08/27/19 0529  BP: 127/66 131/83 133/86 134/81  Pulse: (!) 51 72 (!) 54 (!) 52  Resp: 14 16 16 17   Temp: 97.8 F (36.6 C) 97.8 F (36.6 C)  97.8 F (36.6 C)  TempSrc: Oral Oral Oral Oral  SpO2: 100%   100%  Weight:      Height:       General: alert, cooperative and no distress Lochia: appropriate Uterine Fundus: firm Incision: Dressing is clean, dry, and intact DVT Evaluation: No evidence of DVT seen on physical exam. Labs: Lab Results  Component Value Date   WBC 20.8 (H) 08/25/2019   HGB 12.1 08/25/2019   HCT 36.0 08/25/2019   MCV 96.8 08/25/2019   PLT 148 (L) 08/25/2019   CMP Latest Ref Rng & Units 08/25/2019  Glucose 70 - 99 mg/dL -  BUN 6 - 20 mg/dL -  Creatinine 0.44 - 1.00 mg/dL 0.78  Sodium 135 -  145 mmol/L -  Potassium 3.5 - 5.1 mmol/L -  Chloride 98 - 111 mmol/L -  CO2 22 - 32 mmol/L -  Calcium 8.9 - 10.3 mg/dL -  Total Protein 6.5 - 8.1 g/dL -  Total Bilirubin 0.3 - 1.2 mg/dL -  Alkaline Phos 38 - 126 U/L -  AST 15 - 41 U/L -  ALT 0 - 44 U/L -   Edinburgh Score: Edinburgh Postnatal Depression Scale Screening Tool 08/26/2019  I have been able to laugh and see the funny side of things. 1  I have looked forward with enjoyment to things. 1  I have blamed myself unnecessarily when things went wrong. 1  I have been anxious or worried for no good reason. 1  I have felt scared or panicky for no good reason. 0  Things have been getting on top of me. 2  I have been so unhappy that I have had difficulty  sleeping. 2  I have felt sad or miserable. 1  I have been so unhappy that I have been crying. 1  The thought of harming myself has occurred to me. 0  Edinburgh Postnatal Depression Scale Total 10     After visit meds:  Allergies as of 08/27/2019   No Known Allergies     Medication List    STOP taking these medications   aspirin EC 81 MG tablet   cyclobenzaprine 10 MG tablet Commonly known as: FLEXERIL   promethazine 12.5 MG tablet Commonly known as: PHENERGAN     TAKE these medications   Accu-Chek Nano SmartView w/Device Kit 1 kit by Subdermal route as directed. Fasting blood sugar   Accu-Chek SmartView test strip Generic drug: glucose blood Use as instructed to check blood sugars   Accu-Chek Softclix Lancets lancets 1 each by Other route daily after breakfast.   acetaminophen 500 MG tablet Commonly known as: TYLENOL Take 2 tablets (1,000 mg total) by mouth every 8 (eight) hours.   albuterol 108 (90 Base) MCG/ACT inhaler Commonly known as: VENTOLIN HFA Inhale 2 puffs into the lungs every 4 (four) hours as needed for wheezing or shortness of breath.   amLODipine 5 MG tablet Commonly known as: NORVASC Take 1 tablet (5 mg total) by mouth daily.   Blood Pressure Kit Devi 1 Device by Does not apply route as needed.   calcium carbonate 500 MG chewable tablet Commonly known as: TUMS - dosed in mg elemental calcium Chew 2 tablets by mouth at bedtime.   Comfort Fit Maternity Supp Med Misc 1 Units by Does not apply route daily.   docusate sodium 100 MG capsule Commonly known as: COLACE Take 1 capsule (100 mg total) by mouth 2 (two) times daily.   ibuprofen 800 MG tablet Commonly known as: ADVIL Take 1 tablet (800 mg total) by mouth every 8 (eight) hours.   omeprazole 20 MG capsule Commonly known as: PRILOSEC Take 1 capsule (20 mg total) by mouth daily.   ondansetron 8 MG disintegrating tablet Commonly known as: Zofran ODT Take 1 tablet (8 mg total) by  mouth every 8 (eight) hours as needed for nausea or vomiting.   oxyCODONE 5 MG immediate release tablet Commonly known as: Oxy IR/ROXICODONE Take 1 tablet (5 mg total) by mouth every 4 (four) hours as needed for severe pain or breakthrough pain.   polyethylene glycol 17 g packet Commonly known as: MIRALAX / GLYCOLAX Take 17 g by mouth daily.   valACYclovir 1000 MG tablet Commonly known as: VALTREX Take  0.5 tablets (500 mg total) by mouth 2 (two) times daily. 1 pill twice a day at first sign of rash x 5 days        Discharge home in stable condition Infant Feeding: Breast Infant Disposition:home with mother Discharge instruction: per After Visit Summary and Postpartum booklet. Activity: Advance as tolerated. Pelvic rest for 6 weeks.  Diet: routine diet Future Appointments: No future appointments. Follow up Visit:   Please schedule this patient for a In person postpartum visit in 4 weeks with the following provider: MD. Additional Postpartum F/U:Incision check 1 week with BP check High risk pregnancy complicated by: IUGR, Mono/Di Twin Pregnancy Delivery mode:     Shatoya, Roets [[id number]]  C-Section, Low Transverse    Yona, Kosek [[id number]]  C-Section, Low Transverse   Anticipated Birth Control:  Nexplanon   08/27/2019 [place], DO

## 2019-08-24 NOTE — Transfer of Care (Signed)
Immediate Anesthesia Transfer of Care Note  Patient: Beth Carr  Procedure(s) Performed: CESAREAN SECTION MULTI-GESTATIONAL (N/A Abdomen)  Patient Location: PACU  Anesthesia Type:Spinal  Level of Consciousness: awake, alert  and patient cooperative  Airway & Oxygen Therapy: Patient Spontanous Breathing  Post-op Assessment: Report given to RN and Post -op Vital signs reviewed and stable  Post vital signs: Reviewed and stable  Last Vitals:  Vitals Value Taken Time  BP 130/78 08/24/19 1941  Temp    Pulse 57 08/24/19 1944  Resp 21 08/24/19 1944  SpO2 100 % 08/24/19 1944  Vitals shown include unvalidated device data.  Last Pain:  Vitals:   08/24/19 1719  TempSrc:   PainSc: 9          Complications: No complications documented.

## 2019-08-25 ENCOUNTER — Encounter (HOSPITAL_COMMUNITY): Payer: Self-pay | Admitting: Obstetrics and Gynecology

## 2019-08-25 LAB — CBC
HCT: 35 % — ABNORMAL LOW (ref 36.0–46.0)
HCT: 36 % (ref 36.0–46.0)
Hemoglobin: 11.8 g/dL — ABNORMAL LOW (ref 12.0–15.0)
Hemoglobin: 12.1 g/dL (ref 12.0–15.0)
MCH: 32 pg (ref 26.0–34.0)
MCH: 32.5 pg (ref 26.0–34.0)
MCHC: 33.7 g/dL (ref 30.0–36.0)
MCHC: 33.6 g/dL (ref 30.0–36.0)
MCV: 94.9 fL (ref 80.0–100.0)
MCV: 96.8 fL (ref 80.0–100.0)
Platelets: 136 10*3/uL — ABNORMAL LOW (ref 150–400)
Platelets: 148 10*3/uL — ABNORMAL LOW (ref 150–400)
RBC: 3.69 MIL/uL — ABNORMAL LOW (ref 3.87–5.11)
RBC: 3.72 MIL/uL — ABNORMAL LOW (ref 3.87–5.11)
RDW: 12.5 % (ref 11.5–15.5)
RDW: 12.7 % (ref 11.5–15.5)
WBC: 17.7 10*3/uL — ABNORMAL HIGH (ref 4.0–10.5)
WBC: 20.8 10*3/uL — ABNORMAL HIGH (ref 4.0–10.5)
nRBC: 0 % (ref 0.0–0.2)
nRBC: 0 % (ref 0.0–0.2)

## 2019-08-25 LAB — CREATININE, SERUM
Creatinine, Ser: 0.78 mg/dL (ref 0.44–1.00)
GFR calc Af Amer: >60 mL/min (ref >60)
GFR calc non Af Amer: >60 mL/min (ref >60)

## 2019-08-25 MED ORDER — ACETAMINOPHEN 500 MG PO TABS
1000.0000 mg | ORAL_TABLET | Freq: Four times a day (QID) | ORAL | Status: AC
  Administered 2019-08-25 (×3): 1000 mg via ORAL
  Filled 2019-08-25 (×3): qty 2

## 2019-08-25 MED ORDER — OXYCODONE HCL 5 MG PO TABS
5.0000 mg | ORAL_TABLET | ORAL | Status: DC | PRN
  Administered 2019-08-25 – 2019-08-27 (×9): 5 mg via ORAL
  Filled 2019-08-25 (×10): qty 1

## 2019-08-25 MED ORDER — IBUPROFEN 800 MG PO TABS: 800.0000 mg | ORAL_TABLET | Freq: Three times a day (TID) | ORAL | Status: DC

## 2019-08-25 MED ORDER — KETOROLAC TROMETHAMINE 30 MG/ML IJ SOLN
30.0000 mg | Freq: Four times a day (QID) | INTRAMUSCULAR | Status: AC
  Administered 2019-08-25 – 2019-08-26 (×4): 30 mg via INTRAVENOUS
  Filled 2019-08-25 (×4): qty 1

## 2019-08-25 MED ORDER — IBUPROFEN 800 MG PO TABS
800.0000 mg | ORAL_TABLET | Freq: Three times a day (TID) | ORAL | Status: DC
  Administered 2019-08-26 – 2019-08-27 (×4): 800 mg via ORAL
  Filled 2019-08-25 (×4): qty 1

## 2019-08-25 NOTE — Progress Notes (Addendum)
Subjective: Postpartum Day 1: Cesarean Delivery Patient reports tolerating PO, + flatus and no problems voiding.  Has been up and walking without difficulty. Would like Nexplanon placed.  Objective: Vital signs in last 24 hours: Temp:  [97.6 F (36.4 C)-98.3 F (36.8 C)] 98.1 F (36.7 C) (08/21 0253) Pulse Rate:  [55-66] 56 (08/21 0253) Resp:  [14-22] 18 (08/21 0430) BP: (112-142)/(57-102) 112/57 (08/21 0253) SpO2:  [97 %-100 %] 99 % (08/21 0500) Weight:  [63.5 kg] 63.5 kg (08/20 1100)  Physical Exam:  General: alert, cooperative and no distress Lochia: appropriate Uterine Fundus: firm Incision: healing well, no dehiscence, no significant erythema DVT Evaluation: No evidence of DVT seen on physical exam. No cords or calf tenderness. No significant calf/ankle edema.  Recent Labs    08/25/19 0100 08/25/19 0548  HGB 11.8* 12.1  HCT 35.0* 36.0    Assessment/Plan: Status post Cesarean section. Doing well postoperatively. Pain is well controlled with PCA. Hemoglobin stable. Continue current care. Plan for Nexplanon placement.  Sabino Dick 08/25/2019, 7:46 AM   Attestation of Supervision of Student:  I confirm that I have verified the information documented in the resident student's note and that I have also personally reperformed the history, physical exam and all medical decision making activities.  I have verified that all services and findings are accurately documented in this student's note; and I agree with management and plan as outlined in the documentation. I have also made any necessary editorial changes.  Discussed with d/c PCA today and managing pain with 24 hours of IV toradol and PO percocet. Patient voiced understanding and grateful to be disconnected from some tubing today. Having difficulty managing breastfeeding twins with all the lines. Patient states she has ambulated once. Encouraged more frequent ambulation today. Lactation as needed to assist with twin  breastfeeding.   Patient planning Nexplanon for contraception but wants to wait until tomorrow for placement.   Rolm Bookbinder, CNM [facility] [employer], [facility] 08/25/2019 10:08 AM

## 2019-08-25 NOTE — Lactation Note (Addendum)
This note was copied from a baby's chart. Lactation Consultation Note  Patient Name: Beth Carr [id number] Date: 08/25/2019  P3, 8 hour LPTI ( twins). As LC was about to walk in patient's room.  RN ,  Informed LC that mom doesn't want LC services at this time. Mom is breast and formula feeding Twin A and Twin B and infant's are latching well at the breast..    Maternal Data    Feeding Feeding Type: Breast Fed Nipple Type: Slow - flow  LATCH Score Latch: Repeated attempts needed to sustain latch, nipple held in mouth throughout feeding, stimulation needed to elicit sucking reflex.  Audible Swallowing: A few with stimulation  Type of Nipple: Everted at rest and after stimulation  Comfort (Breast/Nipple): Soft / non-tender  Hold (Positioning): Assistance needed to correctly position infant at breast and maintain latch.  LATCH Score: 7  Interventions    Lactation Tools Discussed/Used     Consult Status      Danelle Earthly 08/25/2019, 2:49 AM

## 2019-08-26 DIAGNOSIS — Z30017 Encounter for initial prescription of implantable subdermal contraceptive: Secondary | ICD-10-CM

## 2019-08-26 MED ORDER — ETONOGESTREL 68 MG ~~LOC~~ IMPL
68.0000 mg | DRUG_IMPLANT | Freq: Once | SUBCUTANEOUS | Status: AC
  Administered 2019-08-26: 68 mg via SUBCUTANEOUS
  Filled 2019-08-26: qty 1

## 2019-08-26 MED ORDER — LIDOCAINE HCL 1 % IJ SOLN
0.0000 mL | Freq: Once | INTRAMUSCULAR | Status: AC | PRN
  Administered 2019-08-26: 20 mL via INTRADERMAL
  Filled 2019-08-26: qty 20

## 2019-08-26 NOTE — Progress Notes (Signed)
Subjective: Postpartum Day 2: Cesarean Delivery Patient reports incisional pain when moving around, tolerating PO, + flatus and no problems voiding.  No BM yet.   Objective: Vital signs in last 24 hours: Temp:  [97.5 F (36.4 C)-99.1 F (37.3 C)] 97.8 F (36.6 C) (08/22 0548) Pulse Rate:  [51-54] 51 (08/22 0548) Resp:  [14-20] 14 (08/22 0548) BP: (112-127)/(66-75) 127/66 (08/22 0548) SpO2:  [99 %-100 %] 100 % (08/22 0548)  Physical Exam:  General: alert, cooperative, appears stated age and no distress Lochia: appropriate Uterine Fundus: firm Incision: honeycomb dressing with slight drainage, little specs of blood DVT Evaluation: No cords or calf tenderness. No significant calf/ankle edema.  Recent Labs    08/25/19 0100 08/25/19 0548  HGB 11.8* 12.1  HCT 35.0* 36.0    Assessment/Plan: Status post Cesarean section. Doing well postoperatively. Hemoglobin stable. Continue current care. Plan for Nexplanon before discharge.  Dispo: Plan to keep one more night, discharge tomorrow.  Sabino Dick 08/26/2019, 7:49 AM

## 2019-08-26 NOTE — Lactation Note (Signed)
This note was copied from a baby's chart. Lactation Consultation Note  Patient Name: Beth Carr [id number] Date: 08/26/2019   Per Misty Stanley, RN, Mom was again asked this morning if she wanted to see lactation & Mom declined. I noted small volumes with bottle feeding. I provided Nfant Slow flow nipples to give to [facility] for bottle feeding & left a message with Speech Therapy to see tomorrow.  Barnetta Chapel, NP aware that these nipples are being provided & said that twins would be f/u by Speech tomorrow.   Lurline Hare [facility] 08/26/2019, 2:35 PM

## 2019-08-26 NOTE — Progress Notes (Signed)
Asked MOB if she wanted to see lactation again. MOB declined.

## 2019-08-26 NOTE — Lactation Note (Signed)
This note was copied from a baby's chart. Lactation Consultation Note  Patient Name: Beth Carr AYTKZ'S Date: 08/26/2019   I left a message with [facility] requesting a  call back if they have coverage today & notifying them of the likelihood of these twins needing their services to improve their bottle-feeding abilities.    Lurline Hare [facility] 08/26/2019, 7:47 AM

## 2019-08-26 NOTE — Progress Notes (Signed)
Post-Placental Nexplanon Insertion Procedure Note  Patient was identified. Informed consent was signed, signed copy in chart. A time-out was performed.    The insertion site was identified 8-10 cm (3-4 inches) from the medial epicondyle of the humerus and 3-5 cm (1.25-2 inches) posterior to (below) the sulcus (groove) between the biceps and triceps muscles of the patient's left arm and marked. The site was prepped and draped in the usual sterile fashion. Pt was prepped with alcohol swab and then injected with 2 cc of 1% lidocaine. The site was prepped with betadine. Nexplanon removed form packaging,  Device confirmed in needle, then inserted full length of needle and withdrawn per handbook instructions. Provider and patient verified presence of the implant in the woman's arm by palpation. Pt insertion site was covered with steristrips/adhesive bandage and pressure bandage. There was minimal blood loss. Patient tolerated procedure well.  Patient was given post procedure instructions and Nexplanon user card with expiration date. Condoms were recommended for STI prevention. Patient was asked to keep the pressure dressing on for 24 hours to minimize bruising and keep the adhesive bandage on for 3-5 days. The patient verbalized understanding of the plan of care and agrees.   Lot # [id number] Expiration Date11/08/2021   Judeth Horn, NP

## 2019-08-26 NOTE — Progress Notes (Signed)
Patient rubella non immune. Patient has been given and explained the MMR VIS. Patient declines vaccine. Beth Carr, Beth Carr

## 2019-08-27 ENCOUNTER — Ambulatory Visit: Payer: Medicaid Other

## 2019-08-27 ENCOUNTER — Encounter: Payer: Self-pay | Admitting: *Deleted

## 2019-08-27 MED ORDER — POLYETHYLENE GLYCOL 3350 17 G PO PACK
17.0000 g | PACK | Freq: Every day | ORAL | Status: DC
  Administered 2019-08-27: 17 g via ORAL
  Filled 2019-08-27: qty 1

## 2019-08-27 MED ORDER — DOCUSATE SODIUM 100 MG PO CAPS
100.0000 mg | ORAL_CAPSULE | Freq: Two times a day (BID) | ORAL | 0 refills | Status: DC
Start: 2019-08-27 — End: 2020-01-17

## 2019-08-27 MED ORDER — IBUPROFEN 800 MG PO TABS
800.0000 mg | ORAL_TABLET | Freq: Three times a day (TID) | ORAL | 0 refills | Status: DC
Start: 2019-08-27 — End: 2020-01-17

## 2019-08-27 MED ORDER — ACETAMINOPHEN 500 MG PO TABS: 1000.0000 mg | ORAL_TABLET | Freq: Three times a day (TID) | ORAL | 0 refills | Status: AC

## 2019-08-27 MED ORDER — AMLODIPINE BESYLATE 5 MG PO TABS
5.0000 mg | ORAL_TABLET | Freq: Every day | ORAL | 1 refills | Status: DC
Start: 2019-08-27 — End: 2020-09-11

## 2019-08-27 MED ORDER — POLYETHYLENE GLYCOL 3350 17 G PO PACK: 17.0000 g | PACK | Freq: Every day | ORAL | 0 refills | Status: DC

## 2019-08-27 MED ORDER — AMLODIPINE BESYLATE 5 MG PO TABS
5.0000 mg | ORAL_TABLET | Freq: Every day | ORAL | Status: DC
  Administered 2019-08-27: 5 mg via ORAL
  Filled 2019-08-27: qty 1

## 2019-08-27 MED ORDER — DOCUSATE SODIUM 100 MG PO CAPS
100.0000 mg | ORAL_CAPSULE | Freq: Two times a day (BID) | ORAL | Status: DC
  Administered 2019-08-27: 100 mg via ORAL
  Filled 2019-08-27: qty 1

## 2019-08-27 MED ORDER — OXYCODONE HCL 5 MG PO TABS
5.0000 mg | ORAL_TABLET | ORAL | 0 refills | Status: DC | PRN
Start: 2019-08-27 — End: 2020-01-17

## 2019-08-27 MED FILL — ACETAMINOPHEN 500MG XT STRE: 500 | 5 days supply | Qty: 30 | Fill #0

## 2019-08-27 MED FILL — DOCUSATE SODIUM 100 MG CAPS: 100 | 5 days supply | Qty: 10 | Fill #0

## 2019-08-27 MED FILL — AMLODIPINE BESYLATE 5 MG TA: 5 | 30 days supply | Qty: 30 | Fill #0

## 2019-08-27 MED FILL — IBUPROFEN 800 MG TAB: 800 | 10 days supply | Qty: 30 | Fill #0

## 2019-08-27 MED FILL — oxyCODONE HCL 5 MG TABS: 5 | 5 days supply | Qty: 30 | Fill #0

## 2019-08-27 MED FILL — POLYETHYLENE GLYCOL 3350 PO: 17 | 14 days supply | Qty: 238 | Fill #0

## 2019-08-27 NOTE — Clinical Social Work Maternal (Addendum)
CLINICAL SOCIAL WORK MATERNAL/CHILD NOTE  Patient Details  Name: Beth Carr MRN: [id number] Date of Birth: 1995-05-20  Date:  08/27/2019  Clinical Social Worker Initiating Note:  Laurey Arrow Date/Time: Initiated:  08/27/19/1012     Child's Name:  Beth Carr Parents:  Mother, Father   Need for Interpreter:  None   Reason for Referral:  Current Substance Use/Substance Use During Pregnancy     Address:  [place] [address]    Phone number:  [phone] (home)     Additional phone number: FOB number [phone]  Household Members/Support Persons (HM/SP):   Household Member/Support Person 1, Household Member/Support Person 2   HM/SP Name Relationship DOB or Age  HM/SP -1 Jesse Fall FOB 02/17/1989  HM/SP -2 Trisha Mangle son 08/07/16  HM/SP -3        HM/SP -4        HM/SP -5        HM/SP -6        HM/SP -7        HM/SP -8          Natural Supports (not living in the home):  Extended Family, Artist Supports: None   Employment: Unemployed   Type of Work:     Education:  Programmer, systems   Homebound arranged:    Museum/gallery curator Resources:  Medicaid   Other Resources:  Physicist, medical  , [place]   Cultural/Religious Considerations Which May Impact Care:  None Reported  Strengths:  Ability to meet basic needs  , Home prepared for child  , Pediatrician chosen   Psychotropic Medications:         Pediatrician:    [place]olic[place] area  Pediatrician List:   [facility] ([address])  [place]      Pediatrician Fax Number:    Risk Factors/Current Problems:  Substance Use     Cognitive State:  Alert  , Insightful  , Linear Thinking     Mood/Affect:  Interested  , Comfortable  , Relaxed  , Happy  , Bright  , Calm     CSW Assessment:  CSW met with MOB in room 409 to complete an  assessment for SA hx Edinburgh of 10. When CSW arrived MOB was holding one twin and FOB was holding the other twin.  Everyone appeared happy and comfortable. CSW explained CSW's role and with MOB's permission, CSW asked FOB to leave the room in order for CSW to assess MOB in private; FOB left without incident. MOB was polite, easy to engage, forthcoming and receptive to meeting with CSW.   CSW asked about MOB's SA hx and MOB openly shared the use of marijuana throughout her pregnancy.  MOB reported she last smoked marijuana in July (2021) and she smoked to help increase her appetite. CSW explained the hospital's drug screen policy and MOB was understanding. MOB denied the use of all other substances and CPS hx. CSW offered MOB resources for outpatient substance abuse counseling and MOB declined. MOB is aware that TwinA is positive and TwinB is negative however, CSW will be making a CPS report; MOB was understanding.  CSW explained CPS investigation process and MOB denied having any questions or concerns.   CSW asked about MH hx and MOB denied all MH hx with the exception  of PMADS.  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  MOB reported she was prescribed a medication (name unknown) that made her symptoms subsided.  CSW provided education regarding the baby blues period vs. perinatal mood disorders, discussed treatment and gave resources for mental health follow up if concerns arise.  CSW recommends self-evaluation during the postpartum time period using the New Mom Checklist from Postpartum Progress and encouraged MOB to contact a medical professional if symptoms are noted at any time. CSW presented with insight and awareness and did not present with any acute MH symptoms. CSW assessed for safety and MOB denied SI, HI, and DV.  MOB shared MOB has a good support team that consists of MOB's mother and FOB. MOB also shared feeling comfortable seeking help if help  is needed.   CSW provided review of Sudden Infant Death Syndrome (SIDS) precautions.    CSW made CPS report with CPS intake worker Salomon Mast.   CSW identifies no further need for intervention and no barriers to discharge at this time.  CSW Plan/Description:  No Further Intervention Required/No Barriers to Discharge, Sudden Infant Death Syndrome (SIDS) Education, Perinatal Mood and Anxiety Disorder (PMADs) Education, Other Patient/Family Education, [place], Other Information/Referral to [employer], Child Protective Service Report     Laurey Arrow, MSW, LCSW Clinical Social Work [phone]  Dimple Nanas, [place] 08/27/2019, 10:21 AM

## 2019-08-27 NOTE — Discharge Instructions (Signed)

## 2019-08-28 ENCOUNTER — Other Ambulatory Visit: Payer: Self-pay

## 2019-08-28 ENCOUNTER — Other Ambulatory Visit (HOSPITAL_COMMUNITY)
Admission: RE | Admit: 2019-08-28 | Discharge: 2019-08-28 | Disposition: A | Discharge status: Home / Self Care | Payer: Medicaid Other | Source: Home / Self Care | Attending: Family Medicine | Admitting: Family Medicine

## 2019-08-28 ENCOUNTER — Other Ambulatory Visit: Payer: Self-pay | Admitting: Family Medicine

## 2019-08-28 ENCOUNTER — Inpatient Hospital Stay (HOSPITAL_COMMUNITY)
Admission: AD | Admit: 2019-08-28 | Discharge: 2019-08-28 | Disposition: A | Discharge status: Home / Self Care | Payer: Medicaid Other | Attending: Obstetrics and Gynecology | Admitting: Obstetrics and Gynecology

## 2019-08-28 DIAGNOSIS — Z3480 Encounter for supervision of other normal pregnancy, unspecified trimester: Secondary | ICD-10-CM | POA: Diagnosis present

## 2019-08-28 DIAGNOSIS — Z3A00 Weeks of gestation of pregnancy not specified: Secondary | ICD-10-CM | POA: Insufficient documentation

## 2019-08-28 LAB — SURGICAL PATHOLOGY

## 2019-08-29 LAB — RPR: RPR Ser Ql: NONREACTIVE

## 2019-09-04 ENCOUNTER — Ambulatory Visit (INDEPENDENT_AMBULATORY_CARE_PROVIDER_SITE_OTHER): Payer: Medicaid Other

## 2019-09-04 ENCOUNTER — Other Ambulatory Visit: Payer: Self-pay

## 2019-09-04 VITALS — BP 113/64 | HR 57 | Wt 126.2 lb

## 2019-09-04 DIAGNOSIS — Z1331 Encounter for screening for depression: Secondary | ICD-10-CM

## 2019-09-04 DIAGNOSIS — Z658 Other specified problems related to psychosocial circumstances: Secondary | ICD-10-CM

## 2019-09-04 NOTE — Progress Notes (Signed)
Pt is here today for blood pressure and wound check s/p c-section 08/24/19. Incision is clean, dry, and intact with well approximated edges. No redness or edema to surrounding area. Steri strips removed; 3 remain in place. Reviewed good wound care and s/s of infection with pt. Reports continued constipation that has begun to resolve. Reports no BM for 2 weeks until Monday of this week. Reports abdominal pain 8/10; pt has finished oxycodone, last NSAID taken this AM. Reviewed use of stool softener and Miralax until having daily BM that is easy to pass. Instructed pt to rotate Tylenol and ibuprofen until pain is tolerable. Pt to follow up with office tomorrow with an update regarding pain.   Reviewed with Mathews Robinsons, CNM who agrees with plan of care. States if pain does not improve pt may need to be scheduled with a provider for additional pain management.   Pt reports feelings of depression and overwhelm. Inocente Salles is negative today; denies SI. Pt scheduled with [facility] for follow up.  Fleet Contras RN 09/04/19

## 2019-09-07 NOTE — Progress Notes (Signed)
Patient was assessed and managed by nursing staff during this encounter. I have reviewed the chart and agree with the documentation and plan. I have also made any necessary editorial changes.  Thressa Sheller DNP, CNM  09/07/19  8:00 PM

## 2019-09-17 NOTE — BH Specialist Note (Deleted)
Integrated Behavioral Health via Telemedicine Video (Caregility) Visit  09/17/2019 Beth Carr [id number]  Number of Integrated Behavioral Health visits: *** Session Start time: ***  Session End time: *** Total time: {IBH Total Time:21014050} minutes  Referring Provider: *** Type of Service: Individual, Family, *** Patient/Family location: *** [facility] Provider location: *** All persons participating in visit: ***   I connected with Beth Carr and/or Beth Carr's {family members:20773} by a video enabled telemedicine application (Caregility) and verified that I am speaking with the correct person using two identifiers.   Discussed confidentiality: {YES/NO:21197}  Confirmed demographics & insurance:  {YES/NO:21197}  I discussed that engaging in this virtual visit, they consent to the provision of behavioral healthcare and the services will be billed under their insurance.   Patient and/or legal guardian expressed understanding and consented to virtual visit: {YES/NO:21197}  PRESENTING CONCERNS: Patient and/or family reports the following symptoms/concerns: *** Duration of problem: ***; Severity of problem: {Mild/Moderate/Severe:20260}  STRENGTHS (Protective Factors/Coping Skills): {CHL AMB BH PROTECTIVE FACTORS/STRENGTHS:[phone]}  ASSESSMENT: Patient currently experiencing ***.    GOALS ADDRESSED: Patient will: 1.  Reduce symptoms of: {IBH Symptoms:21014056}  2.  Increase knowledge and/or ability of: {IBH Patient Tools:21014057}  3.  Demonstrate ability to: {IBH Goals:21014053}  Progress of Goals: {CHL AMB BH PROGRESS TOWARDS NTIRW:4315400867}  INTERVENTIONS: Interventions utilized:  {IBH Interventions:21014054} Standardized Assessments completed & reviewed: {IBH Screening Tools:21014051}   OUTCOME: Patient Response: ***   PLAN: 1. Follow up with behavioral health clinician on : *** 2. Behavioral recommendations: *** 3. Referral(s): {IBH  Referrals:21014055}  I discussed the assessment and treatment plan with the patient and/or parent/guardian. They were provided an opportunity to ask questions and all were answered. They agreed with the plan and demonstrated an understanding of the instructions.   They were advised to call back or seek an in-person evaluation as appropriate.  I discussed that the purpose of this visit is to provide behavioral health care while limiting exposure to the novel coronavirus.  Discussed there is a possibility of technology failure and discussed alternative modes of communication if that failure occurs.  Beth Carr  Depression screen [facility] 2/9 07/16/2019 06/27/2019 05/30/2019 04/24/2019 08/05/2016  Decreased Interest 1 0 0 0 1  Down, Depressed, Hopeless 0 0 0 0 0  PHQ - 2 Score 1 0 0 0 1  Altered sleeping 3 1 2 2 1   Tired, decreased energy 1 1 1  0 1  Change in appetite 0 0 0 0 0  Feeling bad or failure about yourself  0 0 0 0 1  Trouble concentrating 0 0 0 0 0  Moving slowly or fidgety/restless 0 0 0 0 0  Suicidal thoughts 0 0 0 0 0  PHQ-9 Score 5 2 3 2 4   Some recent data might be hidden   GAD 7 : Generalized Anxiety Score 07/16/2019 06/27/2019 05/30/2019 04/24/2019  Nervous, Anxious, on Edge 0 0 0 0  Control/stop worrying 0 0 0 0  Worry too much - different things 0 0 0 0  Trouble relaxing 2 0 1 3  Restless 0 1 0 0  Easily annoyed or irritable 0 0 0 0  Afraid - awful might happen 0 0 0 0  Total GAD 7 Score 2 1 1 3     ***

## 2019-09-20 ENCOUNTER — Ambulatory Visit: Payer: Medicaid Other | Admitting: Obstetrics and Gynecology

## 2019-10-01 NOTE — BH Specialist Note (Signed)
Pt did not arrive to video visit and did not answer the phone ; Left HIPPA-compliant message to call back Reathel Turi from [facility] at [phone] (main office) or [phone] (Delenn Ahn's office).  ; left MyChart message for patient.      

## 2019-10-02 ENCOUNTER — Ambulatory Visit: Payer: Medicaid Other | Admitting: Clinical

## 2019-10-02 DIAGNOSIS — Z91199 Patient's noncompliance with other medical treatment and regimen due to unspecified reason: Secondary | ICD-10-CM

## 2019-10-16 ENCOUNTER — Encounter: Payer: Self-pay | Admitting: Student

## 2019-10-16 ENCOUNTER — Ambulatory Visit (INDEPENDENT_AMBULATORY_CARE_PROVIDER_SITE_OTHER): Payer: Medicaid Other | Admitting: Student

## 2019-10-16 ENCOUNTER — Other Ambulatory Visit: Payer: Self-pay

## 2019-10-16 DIAGNOSIS — B373 Candidiasis of vulva and vagina: Secondary | ICD-10-CM

## 2019-10-16 DIAGNOSIS — Z98891 History of uterine scar from previous surgery: Secondary | ICD-10-CM

## 2019-10-16 DIAGNOSIS — F53 Postpartum depression: Secondary | ICD-10-CM

## 2019-10-16 DIAGNOSIS — O99345 Other mental disorders complicating the puerperium: Secondary | ICD-10-CM

## 2019-10-16 DIAGNOSIS — B3731 Acute candidiasis of vulva and vagina: Secondary | ICD-10-CM

## 2019-10-16 MED ORDER — SERTRALINE HCL 50 MG PO TABS: 50.0000 mg | ORAL_TABLET | Freq: Every day | ORAL | 0 refills | Status: DC

## 2019-10-16 MED ORDER — FLUCONAZOLE 150 MG PO TABS: 150.0000 mg | ORAL_TABLET | Freq: Every day | ORAL | 1 refills | Status: DC

## 2019-10-16 NOTE — Patient Instructions (Addendum)
Perinatal Depression When a woman feels excessive sadness, anger, or anxiety during pregnancy or during the first 12 months after she gives birth, she has a condition called perinatal depression. Depression can interfere with work, school, relationships, and other everyday activities. If it is not managed properly, it can also cause problems in the mother and her baby. Sometimes, perinatal depression is left untreated because symptoms are thought to be normal mood swings during and right after pregnancy. If you have symptoms of depression, it is important to talk with your health care provider. What are the causes? The exact cause of this condition is not known. Hormonal changes during and after pregnancy may play a role in causing perinatal depression. What increases the risk? You are more likely to develop this condition if:  You have a personal or family history of depression, anxiety, or mood disorders.  You experience a stressful life event during pregnancy, such as the death of a loved one.  You have a lot of regular life stress.  You do not have support from family members or loved ones, or you are in an abusive relationship. What are the signs or symptoms? Symptoms of this condition include:  Feeling sad or hopeless.  Feelings of guilt.  Feeling irritable or overwhelmed.  Changes in your appetite.  Lack of energy or motivation.  Sleep problems.  Difficulty concentrating or completing tasks.  Loss of interest in hobbies or relationships.  Headaches or stomach problems that do not go away. How is this diagnosed? This condition is diagnosed based on a physical exam and mental evaluation. In some cases, your health care provider may use a depression screening tool. These tools include a list of questions that can help a health care provider diagnose depression. Your health care provider may refer you to a mental health expert who specializes in depression. How is this  treated? This condition may be treated with:  Medicines. Your health care provider will only give you medicines that have been proven safe for pregnancy and breastfeeding.  Talk therapy with a mental health professional to help change your patterns of thinking (cognitive behavioral therapy).  Support groups.  Brain stimulation or light therapies.  Stress reduction therapies, such as mindfulness. Follow these instructions at home: Lifestyle  Do not use any products that contain nicotine or tobacco, such as cigarettes and e-cigarettes. If you need help quitting, ask your health care provider.  Do not use alcohol when you are pregnant. After your baby is born, limit alcohol intake to no more than 1 drink a day. One drink equals 12 oz of beer, 5 oz of wine, or 1 oz of hard liquor.  Consider joining a support group for new mothers. Ask your health care provider for recommendations.  Take good care of yourself. Make sure you: ? Get plenty of sleep. If you are having trouble sleeping, talk with your health care provider. ? Eat a healthy diet. This includes plenty of fruits and vegetables, whole grains, and lean proteins. ? Exercise regularly, as told by your health care provider. Ask your health care provider what exercises are safe for you. General instructions  Take over-the-counter and prescription medicines only as told by your health care provider.  Talk with your partner or family members about your feelings during pregnancy. Share any concerns or anxieties that you may have.  Ask for help with tasks or chores when you need it. Ask friends and family members to provide meals, watch your children, or help with   cleaning.  Keep all follow-up visits as told by your health care provider. This is important. Contact a health care provider if:  You (or people close to you) notice that you have any symptoms of depression.  You have depression and your symptoms get worse.  You  experience side effects from medicines, such as nausea or sleep problems. Get help right away if:  You feel like hurting yourself, your baby, or someone else. If you ever feel like you may hurt yourself or others, or have thoughts about taking your own life, get help right away. You can go to your nearest emergency department or call:  Your local emergency services (911 in the [place]  A suicide crisis helpline, such as the National Suicide Prevention Lifeline at (712) 728-0496. This is open 24 hours a day. Summary  Perinatal depression is when a woman feels excessive sadness, anger, or anxiety during pregnancy or during the first 12 months after she gives birth.  If perinatal depression is not treated, it can lead to health problems for the mother and her baby.  This condition is treated with medicines, talk therapy, stress reduction therapies, or a combination of two or more treatments.  Talk with your partner or family members about your feelings. Do not be afraid to ask for help. This information is not intended to replace advice given to you by your health care provider. Make sure you discuss any questions you have with your health care provider. Document Revised: 06/07/2018 Document Reviewed: 02/18/2016 Elsevier Patient Education  2020 Elsevier Inc.    AREA FAMILY PRACTICE PHYSICIANS  [place] ([address]) . [facility] o [address] [place] [place], [place] [address] o [phone] o Mon-Fri 8:30-12:30, 1:30-5:00 o Accepting Medicaid . [facility] Medicine at [facility] [address], [place], [place] [address] o [phone] o Mon-Fri 8:00-5:30 . [place] [employer] o [address], [place], [place] [address] o [phone], Tue, Thur, Fri 8:30-5:00, Wed 10:00-7:00 (closed 1-2pm) o Accepting Medicaid . [facility] o [address], [place], [place]  [address] o Phone - [phone]   Fax  - [phone]  [place] [phone]) . [facility] Medicine o [address], [place], [place] [address] o [phone] o Mon-Fri 8:00-5:00 . [facility] - Pediatrics at [facility])  o [address] [place] [place], [place] [address] o [phone] o Mon-Fri 8:30-5:30, Sat (Oct.-Mar.) 9:00-1:00 o Accepting [facility] [phone]) . [facility] at [facility] o [address] [place], [place] [address] o [phone] o Mon-Fri 8:00-5:00  [place] [phone]) . [facility] Medicine at [facility] o [address] [place], [place] [address] o [phone] o Mon-Fri 8:00-5:00 . Nature conservation officer at [place] o [address] [place], [place], [place] [address] o [phone] o Mon-Fri 8:00-5:00 . Nature conservation officer at [employer] o [address] [place], [place] [address] o [phone] o Mon-Fri 8:00-5:00 . [facility] o [address] [place] [address] o [phone] o Mon-Fri 7:30-5:30  [place] [phone] & [phone]) . [facility] o [address] [place], [place] [address] o [phone] o Mon-Thur 8:00-6:00 o Accepting Medicaid . [facility] Medicine o [address] [place], [place] [address] o [phone] o Mon-Thur 7:30-7:30, Fri 7:30-4:30 o Accepting Medicaid . [facility] Family Medicine at [facility] o [phone] [address], [place], [place]  [address] o [phone]   Fax -  [phone]  [place] ([address] & [phone]) . Nature conservation officer at [employer] o [address] [place], [place] [address] o [phone] o Mon-Fri 7:00-5:00 . [facility] Family Medicine o [address], [place], [place] [address] o [phone] o Mon-Fri 8:00-5:00 o Accepting Medicaid . [facility] - [facility] o [address] [place], [place] [address] o [phone] o Mon-Fri 8:00-5:00 o Accepting Medicaid  [facility] [place] [phone]) . [facility] at [facility] o [address] [place], [place] [address] o [phone] o Mon-Fri 8:00-5:00 . [facility] - [facility] Family Medicine at [employer]) o [address], [place], [place] [address] o [phone] o Mon-Fri 8:00-5:00 o Accepting Medicaid . [facility] - [facility] at [employer]) o [address], [place], [place] [address] o [phone] o Mon-Fri 8:00-5:30, Sat&Sun by appointment (phones open at 8:30) o Accepting [facility] [phone] & [phone]) . [facility] Medicine o [address], [place], [place] [address] o [phone] o Mon-Thur 8:00-7:00, Fri 8:00-5:00, Sat 8:00-12:00, Sun 9:00-12:00 o Accepting Medicaid . [facility] Family Medicine at [facility] [address], [place], [place] [address] o [phone] o Mon-Thur 8:00-5:00 o Accepting Medicaid . [facility] Family Medicine at [place] o [address] [place] [place], [place] [address] o [phone] o Mon-Fri 8:00-5:30, Sat (Oct.-Mar.) 9:00-1:00 o Accepting [facility] [phone]) . [facility] Medicine o [address] [place], [place] [address] o [phone] o Mon-Fri 8:00-5:00 o Accepting Medicaid   [facility] [phone]) . [facility] Family Medicine at [facility] o [address], [place], [place] [address] o [phone] o Mon-Fri 8:00-5:00 . Nature conservation officer at [facility] o [address], [place], [place] [address] o [phone] o Mon-Fri 8:00-5:00 . [facility] Health - [facility] - [place] o [address], [place], [place] [address] o [phone] o Mon-Fri 8:00-5:00 o After hours clinic [facility] [place], [place] [address]) [phone] Mon-Fri  5:00-8:00, Sat 12:00-6:00, Sun 10:00-4:00 o Accepting Medicaid . [facility] at [facility] o [address], [place], [place]  [address] o [phone]   Fax - [phone]  [place] [phone]) . Nature conservation officer at [facility] o [address] [place], [place] [address] o [phone] o Mon-Fri 8:00-5:00 . [facility] - [place] [facility] [place]) o [address] [place], [place] [address] o [phone] o Mon-Thur 8:00-7:00, Fri 8:00-5:00, Sat 8:00-12:00

## 2019-10-16 NOTE — Progress Notes (Signed)
Post Partum Visit Note  Beth Carr is a 24 y.o. [phone] female who presents for a postpartum visit. She is 7 weeks postpartum following a primary cesarean section.  I have fully reviewed the prenatal and intrapartum course. The delivery was at 35/6 gestational weeks.  Anesthesia: spinal. Postpartum course has been normal. Babies are doing well. Babies are feeding by bottle - [employer] Good Start. Bleeding thin lochia. Bowel function is normal. Bladder function is normal. Patient is not sexually active. Contraception method is Nexplanon. Postpartum depression screening: positive.  Patient reports daily crying episodes and feelings of sadness. No SI or HI. She had PPD with her last child & was on medications.   The pregnancy intention screening data noted above was reviewed. Potential methods of contraception were discussed. The patient elected to proceed with Hormonal Implant.    Edinburgh Postnatal Depression Scale - 10/16/19 1042      Edinburgh Postnatal Depression Scale:  In the Past 7 Days   I have been able to laugh and see the funny side of things. 0    I have looked forward with enjoyment to things. 0    I have blamed myself unnecessarily when things went wrong. 0    I have been anxious or worried for no good reason. 2    I have felt scared or panicky for no good reason. 2    Things have been getting on top of me. 2    I have been so unhappy that I have had difficulty sleeping. 0    I have felt sad or miserable. 2    I have been so unhappy that I have been crying. 2    The thought of harming myself has occurred to me. 0    Edinburgh Postnatal Depression Scale Total 10            The following portions of the patient's history were reviewed and updated as appropriate: allergies, current medications, past family history, past medical history, past social history, past surgical history and problem list.  Review of Systems Pertinent items are noted in HPI.    Objective:   Blood pressure 112/62, pulse 72, height 5\' 2"  (1.575 m), weight 122 lb 12.8 oz (55.7 kg), last menstrual period 12/16/2018, not currently breastfeeding.  General:  alert, cooperative and appears stated age  Lungs: clear to auscultation bilaterally  Heart:  regular rate and rhythm, S1, S2 normal, no murmur, click, rub or gallop  Abdomen: soft, non-tender; bowel sounds normal; no masses,  no organomegaly and c/section incision well healed   Vulva:  not evaluated  Vagina: not evaluated        Assessment:    Normal postpartum exam. Pap smear not done at today's visit.   Plan:   Essential components of care per ACOG recommendations:  1.  Mood and well being: Patient with positive depression screening today. Reviewed local resources for support. Will start on zoloft 50 mg daily & have mood check in 3 weeks to assess for need in dosing adjustment. Will have f/u with 14/12/2018 while waiting to get in with her previous counselor.   - Patient does not use tobacco.  - hx of drug use? Yes. Has resources.   2. Infant care and feeding:  -Patient currently breastmilk feeding? No  -Social determinants of health (SDOH) reviewed in EPIC. No concerns  3. Sexuality, contraception and birth spacing - Patient does not want a pregnancy in the next year.  Desired family size  is 3 children.  - Reviewed forms of contraception in tiered fashion. Patient has nexplanon in place -Discussed birth spacing of 18 months  4. Sleep and fatigue -Encouraged family/partner/community support of 4 hrs of uninterrupted sleep to help with mood and fatigue  5. Physical Recovery  - Discussed patients delivery and complications - Patient has urinary incontinence? No  - Patient is safe to resume physical and sexual activity  6.  Health Maintenance - Last pap smear done 02/2019 and was normal with negative HPV.  7. No Chronic Disease - PCP follow up - given list of PCPs & will establish care. Discussed we can initially  manage her SSRI until she can f/u with PCP  Judeth Horn, NP [facility], [facility]

## 2019-10-17 NOTE — BH Specialist Note (Signed)
Pt did not arrive to video visit and did not answer the phone ; Left HIPPA-compliant message to call back Twala Collings from [facility] at [phone] (main office) or [phone] (Kalob Bergen's office).  ; left MyChart message for patient.      

## 2019-10-19 ENCOUNTER — Ambulatory Visit: Payer: Medicaid Other | Admitting: Clinical

## 2019-10-19 DIAGNOSIS — Z5329 Procedure and treatment not carried out because of patient's decision for other reasons: Secondary | ICD-10-CM

## 2019-10-19 DIAGNOSIS — Z91199 Patient's noncompliance with other medical treatment and regimen due to unspecified reason: Secondary | ICD-10-CM

## 2019-11-06 ENCOUNTER — Ambulatory Visit: Payer: Medicaid Other | Admitting: Women's Health

## 2019-12-02 ENCOUNTER — Ambulatory Visit (HOSPITAL_COMMUNITY)
Admission: EM | Admit: 2019-12-02 | Discharge: 2019-12-02 | Disposition: A | Discharge status: Home / Self Care | Payer: Medicaid Other | Attending: Family Medicine | Admitting: Family Medicine

## 2019-12-02 ENCOUNTER — Other Ambulatory Visit: Payer: Self-pay

## 2019-12-02 DIAGNOSIS — N760 Acute vaginitis: Secondary | ICD-10-CM | POA: Diagnosis not present

## 2019-12-02 DIAGNOSIS — N898 Other specified noninflammatory disorders of vagina: Secondary | ICD-10-CM | POA: Insufficient documentation

## 2019-12-02 MED ORDER — METRONIDAZOLE 500 MG PO TABS: 500.0000 mg | ORAL_TABLET | Freq: Two times a day (BID) | ORAL | 0 refills | Status: DC

## 2019-12-02 NOTE — ED Triage Notes (Signed)
Pt reports Vag DC that is clear and has a fishy smell.
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
## 2019-12-02 NOTE — Discharge Instructions (Signed)
Swab testing will take about 3 days to come back. We will be in touch with any abnormal results that require further treatment    I have sent in metronidazole for you to take twice a day for 7 days. Do not drink alcohol while taking this medication.  Follow up with this office or with primary care if symptoms are persisting.  Follow up in the ER for high fever, trouble swallowing, trouble breathing, other concerning symptoms.

## 2019-12-03 LAB — CERVICOVAGINAL ANCILLARY ONLY
Bacterial Vaginitis (gardnerella): POSITIVE — AB
Candida Glabrata: NEGATIVE
Candida Vaginitis: POSITIVE — AB
Chlamydia: NEGATIVE
Comment: NEGATIVE
Comment: NEGATIVE
Comment: NEGATIVE
Comment: NEGATIVE
Comment: NEGATIVE
Comment: NORMAL
Neisseria Gonorrhea: NEGATIVE
Trichomonas: NEGATIVE

## 2019-12-04 ENCOUNTER — Telehealth (HOSPITAL_COMMUNITY): Payer: Self-pay | Admitting: Emergency Medicine

## 2019-12-04 MED ORDER — FLUCONAZOLE 150 MG PO TABS: 150.0000 mg | ORAL_TABLET | Freq: Once | ORAL | 0 refills | Status: AC

## 2019-12-27 ENCOUNTER — Ambulatory Visit (HOSPITAL_COMMUNITY)
Admission: EM | Admit: 2019-12-27 | Discharge: 2019-12-27 | Disposition: A | Discharge status: Home / Self Care | Payer: Medicaid Other | Attending: Emergency Medicine | Admitting: Emergency Medicine

## 2019-12-27 ENCOUNTER — Other Ambulatory Visit: Payer: Self-pay

## 2019-12-27 ENCOUNTER — Ambulatory Visit (INDEPENDENT_AMBULATORY_CARE_PROVIDER_SITE_OTHER): Payer: Medicaid Other

## 2019-12-27 ENCOUNTER — Encounter (HOSPITAL_COMMUNITY): Payer: Self-pay | Admitting: Emergency Medicine

## 2019-12-27 DIAGNOSIS — R079 Chest pain, unspecified: Secondary | ICD-10-CM

## 2019-12-27 DIAGNOSIS — T7491XA Unspecified adult maltreatment, confirmed, initial encounter: Secondary | ICD-10-CM

## 2019-12-27 MED ORDER — MELOXICAM 15 MG PO TABS: 15.0000 mg | ORAL_TABLET | Freq: Every day | ORAL | 0 refills | Status: DC
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
## 2020-01-17 ENCOUNTER — Telehealth (HOSPITAL_COMMUNITY): Payer: Self-pay | Admitting: Family Medicine

## 2020-01-17 ENCOUNTER — Other Ambulatory Visit: Payer: Self-pay

## 2020-01-17 ENCOUNTER — Ambulatory Visit (HOSPITAL_COMMUNITY)
Admission: EM | Admit: 2020-01-17 | Discharge: 2020-01-17 | Disposition: A | Discharge status: Home / Self Care | Payer: Medicaid Other | Attending: Family Medicine | Admitting: Family Medicine

## 2020-01-17 ENCOUNTER — Encounter (HOSPITAL_COMMUNITY): Payer: Self-pay

## 2020-01-17 DIAGNOSIS — L0100 Impetigo, unspecified: Secondary | ICD-10-CM | POA: Diagnosis not present

## 2020-01-17 DIAGNOSIS — J029 Acute pharyngitis, unspecified: Secondary | ICD-10-CM | POA: Insufficient documentation

## 2020-01-17 DIAGNOSIS — Z20822 Contact with and (suspected) exposure to covid-19: Secondary | ICD-10-CM | POA: Insufficient documentation

## 2020-01-17 LAB — SARS CORONAVIRUS 2 (TAT 6-24 HRS): SARS Coronavirus 2: NEGATIVE

## 2020-01-17 MED ORDER — MUPIROCIN 2 % EX OINT: 1.0000 "application " | TOPICAL_OINTMENT | Freq: Two times a day (BID) | CUTANEOUS | 0 refills | Status: DC

## 2020-01-17 NOTE — ED Triage Notes (Signed)
Pt in with c/o ST and subjective fever that has been going on for about 3 days  Pt has been using spray for ST with no relief

## 2020-01-17 NOTE — Telephone Encounter (Signed)
Pt called stating pharmacy did not receive RX sent today by UC. Resent to pharmacy, patient notified.

## 2020-01-17 NOTE — Discharge Instructions (Signed)
Use the mupirocin 2 x a day Go home to rest Drink plenty of fluids Take Tylenol for pain or fever You may take over-the-counter cough and cold medicines as needed You must quarantine at home until your test result is available You can check for your test result in MyChart
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
## 2020-07-04 ENCOUNTER — Other Ambulatory Visit: Payer: Self-pay

## 2020-07-04 ENCOUNTER — Encounter (HOSPITAL_COMMUNITY): Payer: Self-pay | Admitting: Emergency Medicine

## 2020-07-04 ENCOUNTER — Ambulatory Visit (HOSPITAL_COMMUNITY)
Admission: EM | Admit: 2020-07-04 | Discharge: 2020-07-04 | Disposition: A | Discharge status: Home / Self Care | Payer: Medicaid Other | Attending: Medical Oncology | Admitting: Medical Oncology

## 2020-07-04 DIAGNOSIS — L0100 Impetigo, unspecified: Secondary | ICD-10-CM | POA: Diagnosis not present

## 2020-07-04 MED ORDER — DOXYCYCLINE HYCLATE 100 MG PO CAPS: 100.0000 mg | ORAL_CAPSULE | Freq: Two times a day (BID) | ORAL | 0 refills | Status: DC
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
## 2020-07-04 NOTE — ED Triage Notes (Signed)
Pt presents with runny nose xs 1-2 weeks. States has multiple infections on face.

## 2020-07-05 ENCOUNTER — Emergency Department (HOSPITAL_COMMUNITY)
Admission: EM | Admit: 2020-07-05 | Discharge: 2020-07-05 | Disposition: A | Discharge status: Home / Self Care | Payer: Medicaid Other | Attending: Emergency Medicine | Admitting: Emergency Medicine

## 2020-07-05 ENCOUNTER — Other Ambulatory Visit: Payer: Self-pay

## 2020-07-05 DIAGNOSIS — R110 Nausea: Secondary | ICD-10-CM | POA: Insufficient documentation

## 2020-07-05 DIAGNOSIS — J4520 Mild intermittent asthma, uncomplicated: Secondary | ICD-10-CM | POA: Diagnosis not present

## 2020-07-05 DIAGNOSIS — F1123 Opioid dependence with withdrawal: Secondary | ICD-10-CM | POA: Insufficient documentation

## 2020-07-05 DIAGNOSIS — R197 Diarrhea, unspecified: Secondary | ICD-10-CM | POA: Insufficient documentation

## 2020-07-05 DIAGNOSIS — F1193 Opioid use, unspecified with withdrawal: Secondary | ICD-10-CM

## 2020-07-05 LAB — BASIC METABOLIC PANEL
Anion gap: 8 (ref 5–15)
BUN: <5 mg/dL — ABNORMAL LOW (ref 6–20)
CO2: 24 mmol/L (ref 22–32)
Calcium: 9.3 mg/dL (ref 8.9–10.3)
Chloride: 108 mmol/L (ref 98–111)
Creatinine, Ser: 0.76 mg/dL (ref 0.44–1.00)
GFR, Estimated: >60 mL/min (ref >60)
Glucose, Bld: 109 mg/dL — ABNORMAL HIGH (ref 70–99)
Potassium: 3.3 mmol/L — ABNORMAL LOW (ref 3.5–5.1)
Sodium: 140 mmol/L (ref 135–145)

## 2020-07-05 LAB — CBC WITH DIFFERENTIAL/PLATELET
Abs Immature Granulocytes: 0.03 10*3/uL (ref 0.00–0.07)
Basophils Absolute: 0 10*3/uL (ref 0.0–0.1)
Basophils Relative: 0 %
Eosinophils Absolute: 0 10*3/uL (ref 0.0–0.5)
Eosinophils Relative: 0 %
HCT: 48.6 % — ABNORMAL HIGH (ref 36.0–46.0)
Hemoglobin: 16.9 g/dL — ABNORMAL HIGH (ref 12.0–15.0)
Immature Granulocytes: 0 %
Lymphocytes Relative: 6 %
Lymphs Abs: 0.8 10*3/uL (ref 0.7–4.0)
MCH: 32.3 pg (ref 26.0–34.0)
MCHC: 34.8 g/dL (ref 30.0–36.0)
MCV: 92.7 fL (ref 80.0–100.0)
Monocytes Absolute: 0.5 10*3/uL (ref 0.1–1.0)
Monocytes Relative: 4 %
Neutro Abs: 11.8 10*3/uL — ABNORMAL HIGH (ref 1.7–7.7)
Neutrophils Relative %: 90 %
Platelets: 235 10*3/uL (ref 150–400)
RBC: 5.24 MIL/uL — ABNORMAL HIGH (ref 3.87–5.11)
RDW: 12.7 % (ref 11.5–15.5)
WBC: 13.2 10*3/uL — ABNORMAL HIGH (ref 4.0–10.5)
nRBC: 0 % (ref 0.0–0.2)

## 2020-07-05 LAB — I-STAT BETA HCG BLOOD, ED (MC, WL, AP ONLY): I-stat hCG, quantitative: <5 m[IU]/mL (ref <5)

## 2020-07-05 MED ORDER — ONDANSETRON HCL 4 MG PO TABS: 4.0000 mg | ORAL_TABLET | Freq: Four times a day (QID) | ORAL | 0 refills | Status: DC

## 2020-07-05 MED ORDER — SODIUM CHLORIDE 0.9 % IV BOLUS
1000.0000 mL | Freq: Once | INTRAVENOUS | Status: AC
  Administered 2020-07-05: 16:00:00 1000 mL via INTRAVENOUS

## 2020-07-05 MED ORDER — ONDANSETRON HCL 4 MG/2ML IJ SOLN
4.0000 mg | Freq: Once | INTRAMUSCULAR | Status: AC
  Administered 2020-07-05: 16:00:00 4 mg via INTRAVENOUS
  Filled 2020-07-05: qty 2

## 2020-07-05 MED ORDER — HYDROXYZINE HCL 25 MG PO TABS: 25.0000 mg | ORAL_TABLET | Freq: Three times a day (TID) | ORAL | 0 refills | Status: DC | PRN

## 2020-07-05 MED ORDER — POTASSIUM CHLORIDE CRYS ER 20 MEQ PO TBCR
40.0000 meq | EXTENDED_RELEASE_TABLET | Freq: Once | ORAL | Status: AC
  Administered 2020-07-05: 17:00:00 40 meq via ORAL
  Filled 2020-07-05: qty 2

## 2020-07-05 NOTE — Discharge Instructions (Addendum)
Return for any problem.  ?
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
## 2020-07-05 NOTE — ED Triage Notes (Signed)
Patient Beth Carr, patient c/o abd pain and numbness to arms. Patient stopped taking pain medications two days ago. Reports diarrhea and staying in bed all day. Patient has been taking oxycodone or hydrocodone for a year, taking suboxone for two months. Patient started on doxycycline yesterday at urgent care for nasal infection, patient stopped taking it believing it has caused her abd symptoms.

## 2020-07-05 NOTE — ED Triage Notes (Signed)
Pt presents to ED via ems cc opiate withdrawal. Pt states she hasn't taken opiates in 2 days. Pt states having an infection in her left nares that she began taking doxy yesterday. Pt states n/v/d, abd pain. Respirations equal, unlabored. A&Ox4.

## 2020-08-25 ENCOUNTER — Ambulatory Visit (HOSPITAL_COMMUNITY)
Admission: EM | Admit: 2020-08-25 | Discharge: 2020-08-25 | Discharge status: Against Medical Advice | Payer: Medicaid Other

## 2020-08-25 ENCOUNTER — Other Ambulatory Visit: Payer: Self-pay

## 2020-09-11 ENCOUNTER — Ambulatory Visit (HOSPITAL_COMMUNITY)
Admission: EM | Admit: 2020-09-11 | Discharge: 2020-09-11 | Disposition: A | Discharge status: Home / Self Care | Payer: Medicaid Other | Attending: Emergency Medicine | Admitting: Emergency Medicine

## 2020-09-11 ENCOUNTER — Other Ambulatory Visit: Payer: Self-pay

## 2020-09-11 ENCOUNTER — Encounter (HOSPITAL_COMMUNITY): Payer: Self-pay

## 2020-09-11 DIAGNOSIS — N390 Urinary tract infection, site not specified: Secondary | ICD-10-CM

## 2020-09-11 DIAGNOSIS — B3731 Acute candidiasis of vulva and vagina: Secondary | ICD-10-CM

## 2020-09-11 DIAGNOSIS — B373 Candidiasis of vulva and vagina: Secondary | ICD-10-CM | POA: Diagnosis not present

## 2020-09-11 LAB — POCT URINALYSIS DIPSTICK, ED / UC
Bilirubin Urine: NEGATIVE
Glucose, UA: NEGATIVE mg/dL
Hgb urine dipstick: NEGATIVE
Ketones, ur: NEGATIVE mg/dL
Nitrite: NEGATIVE
Protein, ur: NEGATIVE mg/dL
Specific Gravity, Urine: 1.02 (ref 1.005–1.030)
Urobilinogen, UA: 0.2 mg/dL (ref 0.0–1.0)
pH: 6 (ref 5.0–8.0)

## 2020-09-11 LAB — POC URINE PREG, ED: Preg Test, Ur: NEGATIVE

## 2020-09-11 MED ORDER — FLUCONAZOLE 150 MG PO TABS: ORAL_TABLET | ORAL | 0 refills | Status: DC

## 2020-09-11 MED ORDER — TERCONAZOLE 0.8 % VA CREA: TOPICAL_CREAM | VAGINAL | 0 refills | Status: DC

## 2020-09-11 MED ORDER — SULFAMETHOXAZOLE-TRIMETHOPRIM 800-160 MG PO TABS: 1.0000 | ORAL_TABLET | Freq: Two times a day (BID) | ORAL | 0 refills | Status: AC

## 2020-09-11 NOTE — Discharge Instructions (Addendum)
Your urinalysis done in office revealed some white blood cells which is concerning for possible urinary tract infection.  That she begin an antibiotic now to treat your symptoms while we are awaiting the results of a urine culture for definitive diagnosis.  I have sent a prescription for Bactrim to your pharmacy, please take 1 tablet in the morning 1 tablet at night for the next 3 days.  You will be contacted with results of urine culture.  I have sent you a prescription for Diflucan to treat you for vaginal Candida, commonly known as yeast infection.  Your prescription for a cream called terconazole which should help relieve your external symptoms.  Follow-up for any worsening symptoms, new onset fever, pain that radiates up to your mid back.
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
## 2020-09-11 NOTE — ED Triage Notes (Signed)
Pt reports increased urinary frequency, burning when urinating and vaginal itching x 6 days.

## 2021-05-28 ENCOUNTER — Emergency Department (HOSPITAL_COMMUNITY)
Admission: EM | Admit: 2021-05-28 | Discharge: 2021-05-28 | Discharge status: Against Medical Advice | Payer: Commercial Managed Care - HMO | Attending: Emergency Medicine | Admitting: Emergency Medicine

## 2021-05-28 ENCOUNTER — Encounter (HOSPITAL_COMMUNITY): Payer: Self-pay | Admitting: Emergency Medicine

## 2021-05-28 ENCOUNTER — Other Ambulatory Visit: Payer: Self-pay

## 2021-05-28 DIAGNOSIS — F419 Anxiety disorder, unspecified: Secondary | ICD-10-CM | POA: Insufficient documentation

## 2021-05-28 DIAGNOSIS — R112 Nausea with vomiting, unspecified: Secondary | ICD-10-CM | POA: Insufficient documentation

## 2021-05-28 DIAGNOSIS — Z5321 Procedure and treatment not carried out due to patient leaving prior to being seen by health care provider: Secondary | ICD-10-CM | POA: Diagnosis not present

## 2021-05-28 DIAGNOSIS — F329 Major depressive disorder, single episode, unspecified: Secondary | ICD-10-CM | POA: Diagnosis not present

## 2021-05-28 DIAGNOSIS — R197 Diarrhea, unspecified: Secondary | ICD-10-CM | POA: Insufficient documentation

## 2021-05-28 LAB — CBC WITH DIFFERENTIAL/PLATELET
Abs Immature Granulocytes: 0.04 10*3/uL (ref 0.00–0.07)
Basophils Absolute: 0 10*3/uL (ref 0.0–0.1)
Basophils Relative: 0 %
Eosinophils Absolute: 0.1 10*3/uL (ref 0.0–0.5)
Eosinophils Relative: 1 %
HCT: 42.4 % (ref 36.0–46.0)
Hemoglobin: 14 g/dL (ref 12.0–15.0)
Immature Granulocytes: 0 %
Lymphocytes Relative: 9 %
Lymphs Abs: 1 10*3/uL (ref 0.7–4.0)
MCH: 32.7 pg (ref 26.0–34.0)
MCHC: 33 g/dL (ref 30.0–36.0)
MCV: 99.1 fL (ref 80.0–100.0)
Monocytes Absolute: 0.4 10*3/uL (ref 0.1–1.0)
Monocytes Relative: 3 %
Neutro Abs: 10.2 10*3/uL — ABNORMAL HIGH (ref 1.7–7.7)
Neutrophils Relative %: 87 %
Platelets: 216 10*3/uL (ref 150–400)
RBC: 4.28 MIL/uL (ref 3.87–5.11)
RDW: 12.5 % (ref 11.5–15.5)
WBC: 11.8 10*3/uL — ABNORMAL HIGH (ref 4.0–10.5)
nRBC: 0 % (ref 0.0–0.2)

## 2021-05-28 LAB — URINALYSIS, ROUTINE W REFLEX MICROSCOPIC
Glucose, UA: NEGATIVE mg/dL
Hgb urine dipstick: NEGATIVE
Ketones, ur: NEGATIVE mg/dL
Nitrite: NEGATIVE
Protein, ur: 30 mg/dL — AB
Specific Gravity, Urine: 1.033 — ABNORMAL HIGH (ref 1.005–1.030)
pH: 7 (ref 5.0–8.0)

## 2021-05-28 LAB — COMPREHENSIVE METABOLIC PANEL
ALT: 14 U/L (ref 0–44)
AST: 20 U/L (ref 15–41)
Albumin: 4.1 g/dL (ref 3.5–5.0)
Alkaline Phosphatase: 53 U/L (ref 38–126)
Anion gap: 7 (ref 5–15)
BUN: <5 mg/dL — ABNORMAL LOW (ref 6–20)
CO2: 26 mmol/L (ref 22–32)
Calcium: 9.2 mg/dL (ref 8.9–10.3)
Chloride: 104 mmol/L (ref 98–111)
Creatinine, Ser: 0.71 mg/dL (ref 0.44–1.00)
GFR, Estimated: >60 mL/min (ref >60)
Glucose, Bld: 106 mg/dL — ABNORMAL HIGH (ref 70–99)
Potassium: 3.8 mmol/L (ref 3.5–5.1)
Sodium: 137 mmol/L (ref 135–145)
Total Bilirubin: 0.5 mg/dL (ref 0.3–1.2)
Total Protein: 6.5 g/dL (ref 6.5–8.1)

## 2021-05-28 LAB — I-STAT BETA HCG BLOOD, ED (MC, WL, AP ONLY): I-stat hCG, quantitative: <5 m[IU]/mL (ref <5)

## 2021-05-28 LAB — LIPASE, BLOOD: Lipase: 26 U/L (ref 11–51)

## 2021-05-28 NOTE — ED Triage Notes (Signed)
Pt presents from home for N/V/D, lower abd pain which is worse during BM x 3 days.  Unable to keep down fluids and food. Have tried pepto with minimal relief. Endorse urinary frequency, vaginal discomfort, vaginal odor.   Denies blood in stool, fever, chills.  H/o anxiety, depression but states this is different from the GI upset she gets with her anxiety.

## 2021-05-28 NOTE — ED Provider Triage Note (Signed)
Emergency Medicine Provider Triage Evaluation Note  Beth Carr , a 26 y.o. female  was evaluated in triage.  Pt complains of nausea, vomiting, and diarrhea.  Is been ongoing for 3 days.  Patient states that she will go days without eating secondary to her anxiety and depression.  However, over the last 3 days she is unable to keep anything down including solids or liquids.  No blood in the stool or vomit.  No fevers or chills.  She does have diffuse abdominal cramping which is worse in the lower abdomen.  No urinary complaints.  Review of Systems  Positive:  Negative: See above  Physical Exam  BP (!) 150/80   Pulse (!) 101   Temp 98.7 F (37.1 C) (Oral)   Resp 16   SpO2 98%  Gen:   Awake, no distress   Resp:  Normal effort  MSK:   Moves extremities without difficulty  Other:  Minimal lower abdominal tenderness  Medical Decision Making  Medically screening exam initiated at 8:47 PM.  Appropriate orders placed.  Beth Carr was informed that the remainder of the evaluation will be completed by another provider, this initial triage assessment does not replace that evaluation, and the importance of remaining in the ED until their evaluation is complete.     Myna Bright [place], [place] 05/28/21 2048

## 2021-05-28 NOTE — ED Notes (Signed)
Patient states she is leaving d/t wait time and kids. Patient states she will go to urgent care in the morning.
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
## 2021-12-22 ENCOUNTER — Emergency Department (HOSPITAL_COMMUNITY)
Admission: EM | Admit: 2021-12-22 | Discharge: 2021-12-22 | Disposition: A | Discharge status: Home / Self Care | Payer: Medicaid Other | Attending: Emergency Medicine | Admitting: Emergency Medicine

## 2021-12-22 ENCOUNTER — Emergency Department (HOSPITAL_COMMUNITY): Payer: Medicaid Other

## 2021-12-22 DIAGNOSIS — T50901A Poisoning by unspecified drugs, medicaments and biological substances, accidental (unintentional), initial encounter: Secondary | ICD-10-CM | POA: Insufficient documentation

## 2021-12-22 DIAGNOSIS — R5383 Other fatigue: Secondary | ICD-10-CM | POA: Diagnosis present

## 2021-12-22 LAB — CBC WITH DIFFERENTIAL/PLATELET
Abs Immature Granulocytes: 0.18 10*3/uL — ABNORMAL HIGH (ref 0.00–0.07)
Basophils Absolute: 0.1 10*3/uL (ref 0.0–0.1)
Basophils Relative: 0 %
Eosinophils Absolute: 0 10*3/uL (ref 0.0–0.5)
Eosinophils Relative: 0 %
HCT: 40.6 % (ref 36.0–46.0)
Hemoglobin: 13.8 g/dL (ref 12.0–15.0)
Immature Granulocytes: 1 %
Lymphocytes Relative: 3 %
Lymphs Abs: 0.6 10*3/uL — ABNORMAL LOW (ref 0.7–4.0)
MCH: 33 pg (ref 26.0–34.0)
MCHC: 34 g/dL (ref 30.0–36.0)
MCV: 97.1 fL (ref 80.0–100.0)
Monocytes Absolute: 0.6 10*3/uL (ref 0.1–1.0)
Monocytes Relative: 3 %
Neutro Abs: 19.6 10*3/uL — ABNORMAL HIGH (ref 1.7–7.7)
Neutrophils Relative %: 93 %
Platelets: 222 10*3/uL (ref 150–400)
RBC: 4.18 MIL/uL (ref 3.87–5.11)
RDW: 12.8 % (ref 11.5–15.5)
WBC: 21.1 10*3/uL — ABNORMAL HIGH (ref 4.0–10.5)
nRBC: 0 % (ref 0.0–0.2)

## 2021-12-22 LAB — COMPREHENSIVE METABOLIC PANEL
ALT: 34 U/L (ref 0–44)
AST: 64 U/L — ABNORMAL HIGH (ref 15–41)
Albumin: 4 g/dL (ref 3.5–5.0)
Alkaline Phosphatase: 68 U/L (ref 38–126)
Anion gap: 10 (ref 5–15)
BUN: 11 mg/dL (ref 6–20)
CO2: 19 mmol/L — ABNORMAL LOW (ref 22–32)
Calcium: 8.3 mg/dL — ABNORMAL LOW (ref 8.9–10.3)
Chloride: 111 mmol/L (ref 98–111)
Creatinine, Ser: 0.97 mg/dL (ref 0.44–1.00)
GFR, Estimated: >60 mL/min (ref >60)
Glucose, Bld: 102 mg/dL — ABNORMAL HIGH (ref 70–99)
Potassium: 3.5 mmol/L (ref 3.5–5.1)
Sodium: 140 mmol/L (ref 135–145)
Total Bilirubin: 0.4 mg/dL (ref 0.3–1.2)
Total Protein: 7 g/dL (ref 6.5–8.1)

## 2021-12-22 LAB — ACETAMINOPHEN LEVEL: Acetaminophen (Tylenol), Serum: <10 ug/mL — ABNORMAL LOW (ref 10–30)

## 2021-12-22 LAB — I-STAT BETA HCG BLOOD, ED (MC, WL, AP ONLY): I-stat hCG, quantitative: <5 m[IU]/mL (ref <5)

## 2021-12-22 LAB — RAPID URINE DRUG SCREEN, HOSP PERFORMED
Amphetamines: POSITIVE — AB
Barbiturates: NOT DETECTED
Benzodiazepines: POSITIVE — AB
Cocaine: POSITIVE — AB
Opiates: NOT DETECTED
Tetrahydrocannabinol: POSITIVE — AB

## 2021-12-22 LAB — SALICYLATE LEVEL: Salicylate Lvl: <7 mg/dL — ABNORMAL LOW (ref 7.0–30.0)

## 2021-12-22 LAB — CBG MONITORING, ED
Glucose-Capillary: 103 mg/dL — ABNORMAL HIGH (ref 70–99)
Glucose-Capillary: 49 mg/dL — ABNORMAL LOW (ref 70–99)
Glucose-Capillary: 69 mg/dL — ABNORMAL LOW (ref 70–99)
Glucose-Capillary: 69 mg/dL — ABNORMAL LOW (ref 70–99)

## 2021-12-22 LAB — ETHANOL: Alcohol, Ethyl (B): <10 mg/dL (ref <10)

## 2021-12-22 MED ORDER — DEXTROSE 50 % IV SOLN
1.0000 | Freq: Once | INTRAVENOUS | Status: AC
  Administered 2021-12-22: 50 mL via INTRAVENOUS
  Filled 2021-12-22: qty 50

## 2021-12-22 MED ORDER — SODIUM CHLORIDE 0.9 % IV SOLN: 250.0000 mL | INTRAVENOUS | Status: DC | PRN

## 2021-12-22 MED ORDER — SODIUM CHLORIDE 0.9 % IV BOLUS
1000.0000 mL | Freq: Once | INTRAVENOUS | Status: AC
  Administered 2021-12-22: 1000 mL via INTRAVENOUS

## 2021-12-22 MED ORDER — SODIUM CHLORIDE 0.9% FLUSH: 3.0000 mL | INTRAVENOUS | Status: DC | PRN

## 2021-12-22 MED ORDER — SODIUM CHLORIDE 0.9% FLUSH
3.0000 mL | Freq: Two times a day (BID) | INTRAVENOUS | Status: DC
  Administered 2021-12-22: 3 mL via INTRAVENOUS

## 2021-12-22 NOTE — Discharge Instructions (Signed)
Stop taking drugs or medicines that are not prescribed to you.  Today you had a drug overdose where you stop breathing for short period of time.  You are lucky that you are still alive.  If you have a family doctor you should follow-up with them in a week

## 2021-12-22 NOTE — ED Triage Notes (Signed)
Pt arrived via EMS, from home, overdose, hx of OD with heroin. Family administered 0.8 mg narcan IM and started compressions. EMS arrival, pt had pulse, but not responsive. NRB for couple minutes, pt woke. Became uncooperative.    CBG 584 per EMS

## 2021-12-22 NOTE — ED Provider Notes (Signed)
[facility] DEPT Provider Note   CSN: [id number] Arrival date & time: 12/22/21  1101     History  No chief complaint on file.   Beth Carr is a 26 y.o. female.  Patient has a history of substance abuse.  She was found by family members who gave her some Narcan.  Also did some chest compressions.  When EMS arrived she was lethargic and was put on a nonrebreather for a number of minutes.  Patient has been observed here for 5 hours  The history is provided by the patient and the EMS personnel.  Drug Overdose This is a new problem. The current episode started less than 1 hour ago. The problem occurs rarely. The problem has been gradually improving. Pertinent negatives include no chest pain, no abdominal pain and no headaches. Nothing aggravates the symptoms. She has tried nothing for the symptoms. The treatment provided no relief.       Home Medications Prior to Admission medications   Medication Sig Start Date End Date Taking? Authorizing Provider  acetaminophen (TYLENOL) 500 MG tablet Take 2 tablets (1,000 mg total) by mouth every 8 (eight) hours. Patient taking differently: Take 1,000 mg by mouth every 8 (eight) hours as needed for mild pain or headache. 08/27/19  Yes Sparacino, Hailey L, DO  amphetamine-dextroamphetamine (ADDERALL) 30 MG tablet Take 30 mg by mouth 2 (two) times daily. 11/20/21  Yes [provider]  ibuprofen (ADVIL) 200 MG tablet Take 200-400 mg by mouth every 6 (six) hours as needed for mild pain or headache.   Yes [provider]  SUBOXONE 8-2 MG FILM Place 1 Film under the tongue 3 (three) times daily.   Yes [provider]  Blood Pressure Monitoring (BLOOD PRESSURE KIT) DEVI 1 Device by Does not apply route as needed. 02/15/19   Anyanwu, Sallyanne Havers, MD  fluconazole (DIFLUCAN) 150 MG tablet Take 1 tablet today.  Take second tablet 3 days from today. Patient not taking: Reported on 12/22/2021 09/11/20    Lynden Oxford Scales, PA-C  hydrOXYzine (ATARAX/VISTARIL) 25 MG tablet Take 1 tablet (25 mg total) by mouth every 8 (eight) hours as needed. Patient not taking: Reported on 12/22/2021 07/05/20   Valarie Merino, MD  terconazole (TERAZOL 3) 0.8 % vaginal cream Apply to external vulvo-vaginal area twice daily as needed for vulvo-vaginal itching and burning. Patient not taking: Reported on 12/22/2021 09/11/20   Lynden Oxford Scales, PA-C  albuterol (PROVENTIL HFA;VENTOLIN HFA) 108 (90 BASE) MCG/ACT inhaler Inhale 2 puffs into the lungs every 4 (four) hours as needed for wheezing or shortness of breath. Patient not taking: Reported on 09/04/2019 12/19/14 01/17/20  Little, Wenda Overland, MD  hydrochlorothiazide (HYDRODIURIL) 25 MG tablet Take 1 tablet (25 mg total) by mouth daily. 08/09/16 09/03/18  Shirley, [place], DO  omeprazole (PRILOSEC) 20 MG capsule Take 1 capsule (20 mg total) by mouth daily. Patient not taking: Reported on 09/04/2019 07/16/19 01/17/20  Luvenia Redden, PA-C  sertraline (ZOLOFT) 50 MG tablet Take 1 tablet (50 mg total) by mouth daily. 10/16/19 01/17/20  Jorje Guild, NP      Allergies    Patient has no known allergies.    Review of Systems   Review of Systems  Constitutional:  Negative for appetite change and fatigue.  HENT:  Negative for congestion, ear discharge and sinus pressure.   Eyes:  Negative for discharge.  Respiratory:  Negative for cough.   Cardiovascular:  Negative for chest pain.  Gastrointestinal:  Negative for abdominal pain and diarrhea.  Genitourinary:  Negative for frequency and hematuria.  Musculoskeletal:  Negative for back pain.  Skin:  Negative for rash.  Neurological:  Negative for seizures and headaches.  Psychiatric/Behavioral:  Negative for hallucinations.     Physical Exam Updated Vital Signs BP 96/61   Pulse 68   Temp (!) 97.4 F (36.3 C) (Oral)   Resp 15   SpO2 99%  Physical Exam Vitals and nursing note reviewed.  Constitutional:       Appearance: She is well-developed.     Comments: Lethargic  HENT:     Head: Normocephalic.     Nose: Nose normal.  Eyes:     General: No scleral icterus.    Conjunctiva/sclera: Conjunctivae normal.  Neck:     Thyroid: No thyromegaly.  Cardiovascular:     Rate and Rhythm: Normal rate and regular rhythm.     Heart sounds: No murmur heard.    No friction rub. No gallop.  Pulmonary:     Breath sounds: No stridor. No wheezing or rales.  Chest:     Chest wall: No tenderness.  Abdominal:     General: There is no distension.     Tenderness: There is no abdominal tenderness. There is no rebound.  Musculoskeletal:        General: Normal range of motion.     Cervical back: Neck supple.  Lymphadenopathy:     Cervical: No cervical adenopathy.  Skin:    Findings: No erythema or rash.  Neurological:     Mental Status: She is oriented to person, place, and time.     Motor: No abnormal muscle tone.     Coordination: Coordination normal.  Psychiatric:        Behavior: Behavior normal.     ED Results / Procedures / Treatments   Labs (all labs ordered are listed, but only abnormal results are displayed) Labs Reviewed  COMPREHENSIVE METABOLIC PANEL - Abnormal; Notable for the following components:      Result Value   CO2 19 (*)    Glucose, Bld 102 (*)    Calcium 8.3 (*)    AST 64 (*)    All other components within normal limits  CBC WITH DIFFERENTIAL/PLATELET - Abnormal; Notable for the following components:   WBC 21.1 (*)    Neutro Abs 19.6 (*)    Lymphs Abs 0.6 (*)    Abs Immature Granulocytes 0.18 (*)    All other components within normal limits  SALICYLATE LEVEL - Abnormal; Notable for the following components:   Salicylate Lvl <9.7 (*)    All other components within normal limits  ACETAMINOPHEN LEVEL - Abnormal; Notable for the following components:   Acetaminophen (Tylenol), Serum <10 (*)    All other components within normal limits  CBG MONITORING, ED - Abnormal;  Notable for the following components:   Glucose-Capillary 103 (*)    All other components within normal limits  CBG MONITORING, ED - Abnormal; Notable for the following components:   Glucose-Capillary 49 (*)    All other components within normal limits  CBG MONITORING, ED - Abnormal; Notable for the following components:   Glucose-Capillary 69 (*)    All other components within normal limits  CBG MONITORING, ED - Abnormal; Notable for the following components:   Glucose-Capillary 69 (*)    All other components within normal limits  ETHANOL  RAPID URINE DRUG SCREEN, HOSP PERFORMED  I-STAT BETA HCG BLOOD, ED ([facility], [facility], [facility] ONLY)  CBG MONITORING, ED  CBG MONITORING, ED    EKG None  Radiology DG Chest 2 View  Result Date: 12/22/2021 CLINICAL DATA:  Drug overdose. EXAM: CHEST - 2 VIEW COMPARISON:  Chest x-ray dated December 27, 2019. FINDINGS: The heart size and mediastinal contours are within normal limits. Both lungs are clear. The visualized skeletal structures are unremarkable. IMPRESSION: No active cardiopulmonary disease. Electronically Signed   By: Titus Dubin M.D.   On: 12/22/2021 12:38    Procedures Procedures    Medications Ordered in ED Medications  sodium chloride flush (NS) 0.9 % injection 3 mL (3 mLs Intravenous Given 12/22/21 1436)  sodium chloride flush (NS) 0.9 % injection 3 mL (has no administration in time range)  0.9 %  sodium chloride infusion (has no administration in time range)  sodium chloride 0.9 % bolus 1,000 mL (has no administration in time range)  dextrose 50 % solution 50 mL (50 mLs Intravenous Given 12/22/21 1436)    ED Course/ Medical Decision Making/ A&P                           Medical Decision Making   This patient presents to the ED for concern of drug overdose, this involves an extensive number of treatment options, and is a complaint that carries with it a high risk of complications and morbidity.  The differential diagnosis includes  throat, drug overdose   Co morbidities that complicate the patient evaluation  History of substance abuse   Additional history obtained:  Additional history obtained from EMS External records from outside source obtained and reviewed including hospital records   Lab Tests:  I Ordered, and personally interpreted labs.  The pertinent results include: Drug screen positive for cocaine benzos amphetamines and marijuana, white count 21,000   Imaging Studies ordered:  I ordered imaging studies including x-ray I independently visualized and interpreted imaging which showed negative I agree with the radiologist interpretation   Cardiac Monitoring: / EKG:  The patient was maintained on a cardiac monitor.  I personally viewed and interpreted the cardiac monitored which showed an underlying rhythm of: Normal sinus rhythm   Consultations Obtained:  No consultant  Problem List / ED Course / Critical interventions / Medication management  Abscess abuse I ordered medication including saline for dehydration Reevaluation of the patient after these medicines showed that the patient improved I have reviewed the patients home medicines and have made adjustments as needed   Social Determinants of Health:  None   Test / Admission - Considered: none   Patient with substance abuse.  She is alert and oriented x 4.  She will be discharged home.        Final Clinical Impression(s) / ED Diagnoses Final diagnoses:  Accidental overdose, initial encounter    Rx / DC Orders ED Discharge Orders     None         Milton Ferguson, MD 12/31/21 1733

## 2021-12-22 NOTE — ED Provider Triage Note (Addendum)
Emergency Medicine Provider Triage Evaluation Note  Beth Carr , a 26 y.o. female  was evaluated in triage.   Per patient: Patient reports she "smoked a blunt" around 9 AM and sat down to watch a movie.  She reports that when she woke up she was with EMS.  She denies any complaints reports she feels well.  She denies heroin or alcohol use she denies chest pain or shortness of breath or any additional concerns.  History per triage note is that patient has a history of heroin use, had OD at home and family administered 0.8 mg of Narcan IM and perform chest compressions.  On EMS arrival patient had a pulse and was placed on nonrebreather for a few minutes before she regained consciousness.  CBG by EMS 584  Review of Systems  Positive: Denies any complaint Negative: No chest pain, shortness of breath, abdominal pain, vomiting, diarrhea, numbness, tingling, weakness or any additional concerns  Physical Exam  BP 124/84 (BP Location: Left Arm)   Pulse 100   Temp 98 F (36.7 C) (Oral)   Resp 12   SpO2 99%  Gen:   Awake, no distress   Resp:  Normal effort  MSK:   Moves extremities without difficulty  Neuro:     Fully alert and oriented        Medical Decision Making  Medically screening exam initiated at 11:40 AM.  Appropriate orders placed.  Lamara A Allerton was informed that the remainder of the evaluation will be completed by another provider, this initial triage assessment does not replace that evaluation, and the importance of remaining in the ED until their evaluation is complete.    Addendum: Informed by RN that CBG is 49.  On ER arrival CBG was 103.  Patient is mentating appropriately at this time.  She has been given juice by nursing staff.  D50 ampule ordered.  I have asked for frequent rechecks.  Patient is being monitored in triage room.  I have asked RN to start an IV.  Note: Portions of this report may have been transcribed using voice recognition software. Every effort was  made to ensure accuracy; however, inadvertent computerized transcription errors may still be present.    Bill Salinas, PA-C 12/22/21 1144    Bill Salinas, PA-C 12/22/21 1229    Bill Salinas, PA-C 12/22/21 1339

## 2022-04-25 ENCOUNTER — Emergency Department (HOSPITAL_COMMUNITY)
Admission: EM | Admit: 2022-04-25 | Discharge: 2022-04-26 | Disposition: A | Discharge status: Home / Self Care | Payer: Medicaid Other | Attending: Emergency Medicine | Admitting: Emergency Medicine

## 2022-04-25 DIAGNOSIS — M25532 Pain in left wrist: Secondary | ICD-10-CM | POA: Diagnosis present

## 2022-04-25 DIAGNOSIS — Y92000 Kitchen of unspecified non-institutional (private) residence as  the place of occurrence of the external cause: Secondary | ICD-10-CM | POA: Diagnosis not present

## 2022-04-25 DIAGNOSIS — W1830XA Fall on same level, unspecified, initial encounter: Secondary | ICD-10-CM | POA: Diagnosis not present

## 2022-04-25 DIAGNOSIS — S7012XA Contusion of left thigh, initial encounter: Secondary | ICD-10-CM | POA: Diagnosis not present

## 2022-04-25 DIAGNOSIS — E871 Hypo-osmolality and hyponatremia: Secondary | ICD-10-CM | POA: Diagnosis not present

## 2022-04-25 DIAGNOSIS — W19XXXA Unspecified fall, initial encounter: Secondary | ICD-10-CM

## 2022-04-25 DIAGNOSIS — R202 Paresthesia of skin: Secondary | ICD-10-CM | POA: Diagnosis not present

## 2022-04-25 NOTE — ED Triage Notes (Signed)
Pt arrives via [facility], pt fell in the kitchen (dizzy while walking and fell). No LOC. Landed on left side. On the floor for about 45 minutes-1 hour. Obvious deformity to the left wrist. Weakness in the left leg. Unable to move toes, no sensation in toes on the left foot. Can move LLE. 100 Fentanyl en route, 4 zofran en route. Bruising across the forehead. 114/76, cbg 141, 95% ra. IV established in the right AC.

## 2022-04-26 ENCOUNTER — Emergency Department (HOSPITAL_COMMUNITY): Payer: Medicaid Other

## 2022-04-26 ENCOUNTER — Emergency Department (HOSPITAL_COMMUNITY)
Admission: EM | Admit: 2022-04-26 | Discharge: 2022-04-26 | Disposition: A | Discharge status: Home / Self Care | Payer: Medicaid Other | Source: Home / Self Care | Attending: Emergency Medicine | Admitting: Emergency Medicine

## 2022-04-26 ENCOUNTER — Encounter (HOSPITAL_COMMUNITY): Payer: Self-pay

## 2022-04-26 ENCOUNTER — Other Ambulatory Visit: Payer: Self-pay

## 2022-04-26 DIAGNOSIS — S7012XD Contusion of left thigh, subsequent encounter: Secondary | ICD-10-CM | POA: Insufficient documentation

## 2022-04-26 DIAGNOSIS — R202 Paresthesia of skin: Secondary | ICD-10-CM | POA: Insufficient documentation

## 2022-04-26 DIAGNOSIS — M25532 Pain in left wrist: Secondary | ICD-10-CM | POA: Insufficient documentation

## 2022-04-26 DIAGNOSIS — R200 Anesthesia of skin: Secondary | ICD-10-CM

## 2022-04-26 DIAGNOSIS — W19XXXD Unspecified fall, subsequent encounter: Secondary | ICD-10-CM | POA: Insufficient documentation

## 2022-04-26 LAB — URINALYSIS, ROUTINE W REFLEX MICROSCOPIC
Bacteria, UA: NONE SEEN
Bilirubin Urine: NEGATIVE
Glucose, UA: 50 mg/dL — AB
Ketones, ur: NEGATIVE mg/dL
Leukocytes,Ua: NEGATIVE
Nitrite: NEGATIVE
Protein, ur: 100 mg/dL — AB
Specific Gravity, Urine: 1.03 (ref 1.005–1.030)
pH: 6 (ref 5.0–8.0)

## 2022-04-26 LAB — COMPREHENSIVE METABOLIC PANEL
ALT: 146 U/L — ABNORMAL HIGH (ref 0–44)
AST: 292 U/L — ABNORMAL HIGH (ref 15–41)
Albumin: 3.6 g/dL (ref 3.5–5.0)
Alkaline Phosphatase: 64 U/L (ref 38–126)
Anion gap: 11 (ref 5–15)
BUN: 17 mg/dL (ref 6–20)
CO2: 23 mmol/L (ref 22–32)
Calcium: 7.6 mg/dL — ABNORMAL LOW (ref 8.9–10.3)
Chloride: 92 mmol/L — ABNORMAL LOW (ref 98–111)
Creatinine, Ser: 0.98 mg/dL (ref 0.44–1.00)
GFR, Estimated: >60 mL/min (ref >60)
Glucose, Bld: 107 mg/dL — ABNORMAL HIGH (ref 70–99)
Potassium: 4 mmol/L (ref 3.5–5.1)
Sodium: 126 mmol/L — ABNORMAL LOW (ref 135–145)
Total Bilirubin: 0.6 mg/dL (ref 0.3–1.2)
Total Protein: 6.3 g/dL — ABNORMAL LOW (ref 6.5–8.1)

## 2022-04-26 LAB — CBC
HCT: 41.3 % (ref 36.0–46.0)
Hemoglobin: 13.7 g/dL (ref 12.0–15.0)
MCH: 33.7 pg (ref 26.0–34.0)
MCHC: 33.2 g/dL (ref 30.0–36.0)
MCV: 101.7 fL — ABNORMAL HIGH (ref 80.0–100.0)
Platelets: 205 10*3/uL (ref 150–400)
RBC: 4.06 MIL/uL (ref 3.87–5.11)
RDW: 12.3 % (ref 11.5–15.5)
WBC: 11.8 10*3/uL — ABNORMAL HIGH (ref 4.0–10.5)
nRBC: 0 % (ref 0.0–0.2)

## 2022-04-26 LAB — I-STAT CHEM 8, ED
BUN: 21 mg/dL — ABNORMAL HIGH (ref 6–20)
BUN: 12 mg/dL (ref 6–20)
Calcium, Ion: 1.01 mmol/L — ABNORMAL LOW (ref 1.15–1.40)
Calcium, Ion: 0.94 mmol/L — ABNORMAL LOW (ref 1.15–1.40)
Chloride: 93 mmol/L — ABNORMAL LOW (ref 98–111)
Chloride: 97 mmol/L — ABNORMAL LOW (ref 98–111)
Creatinine, Ser: 0.9 mg/dL (ref 0.44–1.00)
Creatinine, Ser: 0.7 mg/dL (ref 0.44–1.00)
Glucose, Bld: 100 mg/dL — ABNORMAL HIGH (ref 70–99)
Glucose, Bld: 94 mg/dL (ref 70–99)
HCT: 42 % (ref 36.0–46.0)
HCT: 42 % (ref 36.0–46.0)
Hemoglobin: 14.3 g/dL (ref 12.0–15.0)
Hemoglobin: 14.3 g/dL (ref 12.0–15.0)
Potassium: 4.1 mmol/L (ref 3.5–5.1)
Potassium: 4.6 mmol/L (ref 3.5–5.1)
Sodium: 127 mmol/L — ABNORMAL LOW (ref 135–145)
Sodium: 129 mmol/L — ABNORMAL LOW (ref 135–145)
TCO2: 25 mmol/L (ref 22–32)
TCO2: 26 mmol/L (ref 22–32)

## 2022-04-26 LAB — RAPID URINE DRUG SCREEN, HOSP PERFORMED
Amphetamines: NOT DETECTED
Barbiturates: NOT DETECTED
Benzodiazepines: NOT DETECTED
Cocaine: NOT DETECTED
Opiates: NOT DETECTED
Tetrahydrocannabinol: POSITIVE — AB

## 2022-04-26 LAB — PROTIME-INR
INR: 1 (ref 0.8–1.2)
Prothrombin Time: 13.1 seconds (ref 11.4–15.2)

## 2022-04-26 LAB — SAMPLE TO BLOOD BANK

## 2022-04-26 LAB — I-STAT BETA HCG BLOOD, ED (MC, WL, AP ONLY): I-stat hCG, quantitative: <5 m[IU]/mL (ref <5)

## 2022-04-26 LAB — LACTIC ACID, PLASMA: Lactic Acid, Venous: 2.7 mmol/L (ref 0.5–1.9)

## 2022-04-26 MED ORDER — LACTATED RINGERS IV BOLUS
1000.0000 mL | Freq: Once | INTRAVENOUS | Status: AC
  Administered 2022-04-26: 1000 mL via INTRAVENOUS

## 2022-04-26 MED ORDER — SODIUM CHLORIDE 0.9 % IV BOLUS
1000.0000 mL | Freq: Once | INTRAVENOUS | Status: AC
  Administered 2022-04-26: 1000 mL via INTRAVENOUS

## 2022-04-26 MED ORDER — HYDROMORPHONE HCL 1 MG/ML IJ SOLN
1.0000 mg | Freq: Once | INTRAMUSCULAR | Status: AC
  Administered 2022-04-26: 1 mg via INTRAVENOUS
  Filled 2022-04-26: qty 1

## 2022-04-26 MED ORDER — KETOROLAC TROMETHAMINE 30 MG/ML IJ SOLN
15.0000 mg | Freq: Once | INTRAMUSCULAR | Status: AC
  Administered 2022-04-26: 15 mg via INTRAVENOUS
  Filled 2022-04-26: qty 1

## 2022-04-26 MED ORDER — OXYCODONE-ACETAMINOPHEN 5-325 MG PO TABS
2.0000 | ORAL_TABLET | Freq: Once | ORAL | Status: AC
  Administered 2022-04-26: 2 via ORAL
  Filled 2022-04-26: qty 2

## 2022-04-26 MED ORDER — LORAZEPAM 2 MG/ML IJ SOLN: 1.0000 mg | INTRAMUSCULAR | Status: DC | PRN

## 2022-04-26 MED ORDER — IOHEXOL 350 MG/ML SOLN
100.0000 mL | Freq: Once | INTRAVENOUS | Status: AC | PRN
  Administered 2022-04-26: 100 mL via INTRAVENOUS

## 2022-04-26 MED ORDER — OXYCODONE-ACETAMINOPHEN 5-325 MG PO TABS: 1.0000 | ORAL_TABLET | Freq: Three times a day (TID) | ORAL | 0 refills | Status: DC | PRN

## 2022-04-26 MED ORDER — GADOBUTROL 1 MMOL/ML IV SOLN
5.0000 mL | Freq: Once | INTRAVENOUS | Status: AC | PRN
  Administered 2022-04-26: 5 mL via INTRAVENOUS

## 2022-04-26 NOTE — Progress Notes (Signed)
Orthopedic Tech Progress Note Patient Details:  Beth Carr [birth year] [id number]  Ortho Devices Type of Ortho Device: Arm sling, Sugartong splint Ortho Device/Splint Location: lue Ortho Device/Splint Interventions: Ordered, Adjustment, Application  When I arrived to splint the patient they were having great pain, were unable to move their fingers, the fingers were curled in making a loose fist. The arm was very swollen from the wrist to mid forearm. Before moving forward I made sure with the ed dr that this was condition the patient was in when they treated them, they said that it was the same. The patient was given pain meds then I proceeded to splint the patient. I then splinted the patient with a sugartong. I tried to straighten the wrist as much as possible but the patient was in to much pain. I then applied an arm sling. Post Interventions Patient Tolerated: Well Instructions Provided: Care of device, Adjustment of device  [name] Post 04/26/2022, 2:30 AM

## 2022-04-26 NOTE — ED Notes (Signed)
Called [facility] to transport patient to [address] [place]

## 2022-04-26 NOTE — ED Notes (Signed)
Coming as soon as possible.

## 2022-04-26 NOTE — ED Provider Triage Note (Signed)
Emergency Medicine Provider Triage Evaluation Note  Beth Carr , a 27 y.o. female  was evaluated in triage.  Pt complains of left wrist pain, left leg numbness.  Patient was seen here last night secondary to ground-level fall.  Patient reports that dizzy at home after being dizzy she felt on her left side.  Patient reports that she immediately felt paresthesias and numbness and weakness in her left leg.  Patient also reported significant pain and swelling to her left wrist after the fall.  Per chart review from last night, no syncope was reported.  Patient denies any drug or alcohol use.  Patient returns today complaining of increased pain to her left wrist, "wants it looked at again".  Patient also complaining of increased numbness to her left leg.  The patient had an MRI of her lumbar spine done last night which was unremarkable.  Review of Systems  Positive:  Negative:   Physical Exam  BP 123/88 (BP Location: Right Arm)   Pulse 97   Temp 98.5 F (36.9 C) (Oral)   Resp 18   Ht  (1.6 m)   Wt 54.4 kg   LMP 04/18/2022   SpO2 100%   BMI 21.24 kg/m  Gen:   Awake, no distress   Resp:  Normal effort  MSK:   Moves extremities without difficulty  Other:    Medical Decision Making  Medically screening exam initiated at 6:19 PM.  Appropriate orders placed.  Beth Carr was informed that the remainder of the evaluation will be completed by another provider, this initial triage assessment does not replace that evaluation, and the importance of remaining in the ED until their evaluation is complete.     Beth Decant, PA-C 04/26/22 1820

## 2022-04-26 NOTE — ED Provider Notes (Signed)
[place] [facility]PARTMENT AT [facility] Provider Note   CSN: [id number] Arrival date & time: 04/25/22  2342     History  Chief Complaint  Patient presents with   Fall    Beth Carr is a 27 y.o. female.  27 year old female who presents ER today with EMS for ground-level fall.  She states that she got dizzy at home and after being dizzy she fell down on her left side.  She states that she immediately felt paresthesias and numbness and weakness in her left leg.  She is also having pain in her middle back and his cervical spine.  She also noticed significant pain and swelling to her left wrist right after the fall.  She got little bit dizzy and felt abnormal had cleared up eventually but did not syncopized.  No pain elsewhere.  No alcohol or drugs tonight.  No recent illnesses.   Fall       Home Medications Prior to Admission medications   Medication Sig Start Date End Date Taking? Authorizing Provider  oxyCODONE-acetaminophen (PERCOCET) 5-325 MG tablet Take 1-2 tablets by mouth every 8 (eight) hours as needed. 04/26/22  Yes Britini Garcilazo, Barbara Cower, MD  acetaminophen (TYLENOL) 500 MG tablet Take 2 tablets (1,000 mg total) by mouth every 8 (eight) hours. Patient taking differently: Take 1,000 mg by mouth every 8 (eight) hours as needed for mild pain or headache. 08/27/19   Sparacino, Hailey L, DO  amphetamine-dextroamphetamine (ADDERALL) 30 MG tablet Take 30 mg by mouth 2 (two) times daily. 11/20/21   [provider]  Blood Pressure Monitoring (BLOOD PRESSURE KIT) DEVI 1 Device by Does not apply route as needed. 02/15/19   Anyanwu, Jethro Bastos, MD  fluconazole (DIFLUCAN) 150 MG tablet Take 1 tablet today.  Take second tablet 3 days from today. Patient not taking: Reported on 12/22/2021 09/11/20   Theadora Rama Scales, PA-C  hydrOXYzine (ATARAX/VISTARIL) 25 MG tablet Take 1 tablet (25 mg total) by mouth every 8 (eight) hours as needed. Patient not taking: Reported on  12/22/2021 07/05/20   Wynetta Fines, MD  ibuprofen (ADVIL) 200 MG tablet Take 200-400 mg by mouth every 6 (six) hours as needed for mild pain or headache.    [provider]  SUBOXONE 8-2 MG FILM Place 1 Film under the tongue 3 (three) times daily.    [provider]  terconazole (TERAZOL 3) 0.8 % vaginal cream Apply to external vulvo-vaginal area twice daily as needed for vulvo-vaginal itching and burning. Patient not taking: Reported on 12/22/2021 09/11/20   Theadora Rama Scales, PA-C  albuterol (PROVENTIL HFA;VENTOLIN HFA) 108 (90 BASE) MCG/ACT inhaler Inhale 2 puffs into the lungs every 4 (four) hours as needed for wheezing or shortness of breath. Patient not taking: Reported on 09/04/2019 12/19/14 01/17/20  Little, Ambrose Finland, MD  hydrochlorothiazide (HYDRODIURIL) 25 MG tablet Take 1 tablet (25 mg total) by mouth daily. 08/09/16 09/03/18  Shirley, [place], DO  omeprazole (PRILOSEC) 20 MG capsule Take 1 capsule (20 mg total) by mouth daily. Patient not taking: Reported on 09/04/2019 07/16/19 01/17/20  Marny Lowenstein, PA-C  sertraline (ZOLOFT) 50 MG tablet Take 1 tablet (50 mg total) by mouth daily. 10/16/19 01/17/20  Judeth Horn, NP      Allergies    Patient has no known allergies.    Review of Systems   Review of Systems  Physical Exam Updated Vital Signs BP 132/72 (BP Location: Right Arm)   Pulse 75   Temp 98.2 F (  36.8 C) (Oral)   Resp 16   Ht 5\' 3"  (1.6 m)   Wt 54.4 kg   LMP 04/18/2022   SpO2 94%   BMI 21.26 kg/m  Physical Exam Vitals and nursing note reviewed.  Constitutional:      Appearance: She is well-developed.  HENT:     Head: Normocephalic and atraumatic.  Eyes:     Pupils: Pupils are equal, round, and reactive to light.  Cardiovascular:     Rate and Rhythm: Normal rate and regular rhythm.  Pulmonary:     Effort: No respiratory distress.     Breath sounds: No stridor.  Abdominal:     General: Abdomen is flat. There is no distension.   Musculoskeletal:        General: Swelling (L wrist), tenderness and deformity present.     Cervical back: Normal range of motion.  Neurological:     Mental Status: She is alert.     Comments: Paresthesias to her left lower leg, insensate up to mid shin.  Not able to move her toes on the left leg but is able to lift her leg off the bed.  Right leg seems to be normal right arm seems to be normal her left arm has a likely wrist fracture so did not assess neurologic function there but she has sensation to the tips of her fingers.     ED Results / Procedures / Treatments   Labs (all labs ordered are listed, but only abnormal results are displayed) Labs Reviewed  COMPREHENSIVE METABOLIC PANEL - Abnormal; Notable for the following components:      Result Value   Sodium 126 (*)    Chloride 92 (*)    Glucose, Bld 107 (*)    Calcium 7.6 (*)    Total Protein 6.3 (*)    AST 292 (*)    ALT 146 (*)    All other components within normal limits  CBC - Abnormal; Notable for the following components:   WBC 11.8 (*)    MCV 101.7 (*)    All other components within normal limits  URINALYSIS, ROUTINE W REFLEX MICROSCOPIC - Abnormal; Notable for the following components:   APPearance HAZY (*)    Glucose, UA 50 (*)    Hgb urine dipstick LARGE (*)    Protein, ur 100 (*)    All other components within normal limits  LACTIC ACID, PLASMA - Abnormal; Notable for the following components:   Lactic Acid, Venous 2.7 (*)    All other components within normal limits  RAPID URINE DRUG SCREEN, HOSP PERFORMED - Abnormal; Notable for the following components:   Tetrahydrocannabinol POSITIVE (*)    All other components within normal limits  I-STAT CHEM 8, ED - Abnormal; Notable for the following components:   Sodium 127 (*)    Chloride 93 (*)    BUN 21 (*)    Glucose, Bld 100 (*)    Calcium, Ion 1.01 (*)    All other components within normal limits  I-STAT CHEM 8, ED - Abnormal; Notable for the following  components:   Sodium 129 (*)    Chloride 97 (*)    Calcium, Ion 0.94 (*)    All other components within normal limits  PROTIME-INR  ETHANOL  I-STAT BETA HCG BLOOD, ED ([facility], [facility], [facility] ONLY)  SAMPLE TO BLOOD BANK    EKG None  Radiology MR Lumbar Spine W Wo Contrast  Result Date: 04/26/2022 CLINICAL DATA:  Ataxia with numbness in  both legs EXAM: MRI THORACIC AND LUMBAR SPINE WITHOUT AND WITH CONTRAST TECHNIQUE: Multiplanar and multiecho pulse sequences of the thoracic and lumbar spine were obtained without and with intravenous contrast. CONTRAST:  5mL GADAVIST GADOBUTROL 1 MMOL/ML IV SOLN COMPARISON:  None Available. FINDINGS: MRI THORACIC SPINE FINDINGS Alignment:  Physiologic. Vertebrae: No fracture, evidence of discitis, or bone lesion. Cord:  Normal signal and morphology. Paraspinal and other soft tissues: Negative. Disc levels: No spinal canal stenosis or neural impingement. No abnormal contrast enhancement. MRI LUMBAR SPINE FINDINGS Segmentation:  Standard. Alignment:  Physiologic. Vertebrae:  No fracture, evidence of discitis, or bone lesion. Conus medullaris: Extends to the L1-2 level and appears normal. Paraspinal and other soft tissues: Negative Disc levels: No disc herniation, spinal canal stenosis or neural foraminal stenosis. Visualized sacrum: Normal. No abnormal contrast enhancement. IMPRESSION: Normal MRI of the thoracic and lumbar spine. Electronically Signed   By: Deatra Robinson M.D.   On: 04/26/2022 03:48   MR THORACIC SPINE W WO CONTRAST  Result Date: 04/26/2022 CLINICAL DATA:  Ataxia with numbness in both legs EXAM: MRI THORACIC AND LUMBAR SPINE WITHOUT AND WITH CONTRAST TECHNIQUE: Multiplanar and multiecho pulse sequences of the thoracic and lumbar spine were obtained without and with intravenous contrast. CONTRAST:  5mL GADAVIST GADOBUTROL 1 MMOL/ML IV SOLN COMPARISON:  None Available. FINDINGS: MRI THORACIC SPINE FINDINGS Alignment:  Physiologic. Vertebrae: No fracture, evidence  of discitis, or bone lesion. Cord:  Normal signal and morphology. Paraspinal and other soft tissues: Negative. Disc levels: No spinal canal stenosis or neural impingement. No abnormal contrast enhancement. MRI LUMBAR SPINE FINDINGS Segmentation:  Standard. Alignment:  Physiologic. Vertebrae:  No fracture, evidence of discitis, or bone lesion. Conus medullaris: Extends to the L1-2 level and appears normal. Paraspinal and other soft tissues: Negative Disc levels: No disc herniation, spinal canal stenosis or neural foraminal stenosis. Visualized sacrum: Normal. No abnormal contrast enhancement. IMPRESSION: Normal MRI of the thoracic and lumbar spine. Electronically Signed   By: Deatra Robinson M.D.   On: 04/26/2022 03:48   CT CHEST ABDOMEN PELVIS W CONTRAST  Result Date: 04/26/2022 CLINICAL DATA:  Status post fall. EXAM: CT CHEST, ABDOMEN, AND PELVIS WITH CONTRAST TECHNIQUE: Multidetector CT imaging of the chest, abdomen and pelvis was performed following the standard protocol during bolus administration of intravenous contrast. RADIATION DOSE REDUCTION: This exam was performed according to the departmental dose-optimization program which includes automated exposure control, adjustment of the mA and/or kV according to patient size and/or use of iterative reconstruction technique. CONTRAST:  OMNIPAQUE IOHEXOL 350 MG/ML SOLN COMPARISON:  July 22, 2009 FINDINGS: CT CHEST FINDINGS Cardiovascular: No significant vascular findings. Normal heart size. No pericardial effusion. Mediastinum/Nodes: No enlarged mediastinal, hilar, or axillary lymph nodes. Thyroid gland, trachea, and esophagus demonstrate no significant findings. Lungs/Pleura: Mild atelectasis is seen within the right middle lobe and posterior aspects of the bilateral lung bases. There is no evidence of an acute infiltrate, pleural effusion or pneumothorax. Musculoskeletal: No chest wall mass or suspicious bone lesions identified. CT ABDOMEN PELVIS FINDINGS  Hepatobiliary: There is diffuse fatty infiltration of the liver parenchyma. No focal liver abnormality is seen. The gallbladder is markedly distended. No gallstones, gallbladder wall thickening, or biliary dilatation. Pancreas: Unremarkable. No pancreatic ductal dilatation or surrounding inflammatory changes. Spleen: Normal in size without focal abnormality. Adrenals/Urinary Tract: Adrenal glands are unremarkable. Kidneys are normal, without renal calculi, focal lesion, or hydronephrosis. Bladder is unremarkable. Stomach/Bowel: Stomach is within normal limits. Appendix appears normal. No evidence of bowel wall  thickening, distention, or inflammatory changes. Vascular/Lymphatic: No significant vascular findings are present. No enlarged abdominal or pelvic lymph nodes. Reproductive: Uterus and bilateral adnexa are unremarkable. Other: No abdominal wall hernia or abnormality. No abdominopelvic ascites. Musculoskeletal: No acute or significant osseous findings. IMPRESSION: 1. Mild right middle lobe and bibasilar atelectasis. 2. Hepatic steatosis. 3. No acute or active process within the abdomen or pelvis. Electronically Signed   By: Aram Candela M.D.   On: 04/26/2022 01:00   CT L-SPINE NO CHARGE  Result Date: 04/26/2022 CLINICAL DATA:  Blunt trauma, fall EXAM: CT Thoracic and Lumbar spine with contrast TECHNIQUE: Multiplanar CT images of the thoracic and lumbar spine were reconstructed from contemporary CT of the Chest, Abdomen, and Pelvis. RADIATION DOSE REDUCTION: This exam was performed according to the departmental dose-optimization program which includes automated exposure control, adjustment of the mA and/or kV according to patient size and/or use of iterative reconstruction technique. CONTRAST:  No additional contrast was administered for creation of these reformats. COMPARISON:  None Available. FINDINGS: CT THORACIC SPINE FINDINGS Alignment: Normal. Vertebrae: No acute fracture or focal pathologic  process. Paraspinal and other soft tissues: Negative. Disc levels: Intervertebral disc heights are preserved. Spinal canal is widely patent. No significant neuroforaminal narrowing. CT LUMBAR SPINE FINDINGS Segmentation: 5 lumbar type vertebrae. Alignment: Normal. Vertebrae: No acute fracture or focal pathologic process. Paraspinal and other soft tissues: Negative. Disc levels: Intervertebral disc heights are preserved. Spinal canal is widely patent. No significant neuroforaminal narrowing. No significant facet arthrosis. IMPRESSION: 1. No acute fracture or traumatic listhesis of the thoracolumbar spine. Electronically Signed   By: Helyn Numbers M.D.   On: 04/26/2022 00:59   CT T-SPINE NO CHARGE  Result Date: 04/26/2022 CLINICAL DATA:  Blunt trauma, fall EXAM: CT Thoracic and Lumbar spine with contrast TECHNIQUE: Multiplanar CT images of the thoracic and lumbar spine were reconstructed from contemporary CT of the Chest, Abdomen, and Pelvis. RADIATION DOSE REDUCTION: This exam was performed according to the departmental dose-optimization program which includes automated exposure control, adjustment of the mA and/or kV according to patient size and/or use of iterative reconstruction technique. CONTRAST:  No additional contrast was administered for creation of these reformats. COMPARISON:  None Available. FINDINGS: CT THORACIC SPINE FINDINGS Alignment: Normal. Vertebrae: No acute fracture or focal pathologic process. Paraspinal and other soft tissues: Negative. Disc levels: Intervertebral disc heights are preserved. Spinal canal is widely patent. No significant neuroforaminal narrowing. CT LUMBAR SPINE FINDINGS Segmentation: 5 lumbar type vertebrae. Alignment: Normal. Vertebrae: No acute fracture or focal pathologic process. Paraspinal and other soft tissues: Negative. Disc levels: Intervertebral disc heights are preserved. Spinal canal is widely patent. No significant neuroforaminal narrowing. No significant  facet arthrosis. IMPRESSION: 1. No acute fracture or traumatic listhesis of the thoracolumbar spine. Electronically Signed   By: Helyn Numbers M.D.   On: 04/26/2022 00:59   CT CERVICAL SPINE WO CONTRAST  Result Date: 04/26/2022 CLINICAL DATA:  Status post trauma. EXAM: CT CERVICAL SPINE WITHOUT CONTRAST TECHNIQUE: Multidetector CT imaging of the cervical spine was performed without intravenous contrast. Multiplanar CT image reconstructions were also generated. RADIATION DOSE REDUCTION: This exam was performed according to the departmental dose-optimization program which includes automated exposure control, adjustment of the mA and/or kV according to patient size and/or use of iterative reconstruction technique. COMPARISON:  None Available. FINDINGS: Alignment: Normal. Skull base and vertebrae: No acute fracture. No primary bone lesion or focal pathologic process. Soft tissues and spinal canal: No prevertebral fluid or swelling. No visible canal  hematoma. Disc levels: Early anterior osteophyte formation and posterior bony spurring are seen at the level of C6-C7. Normal endplates are seen throughout the remainder of the cervical spine with normal multilevel intervertebral disc spaces. Normal bilateral multilevel facet joints are noted. Upper chest: Negative. Other: None. IMPRESSION: 1. No acute fracture or subluxation in the cervical spine. 2. Early degenerative changes at the level of C6-C7. ElecAram Candelaned   By: Thaddeus  Houston M.D.   On: 04/26/2022 00:55   CT HEAD WO CONTRAST  Result Date: 04/26/2022 CLINICAL DATA:  Status post trauma. EXAM: CT HEAD WITHOUT CONTRAST TECHNIQUE: Contiguous axial images were obtained from the base of the skull through the vertex without intravenous contrast. RADIATION DOSE REDUCTION: This exam was performed according to the departmental dose-optimization program which includes automated exposure control, adjustment of the mA and/or kV according to patient size and/or  use of iterative reconstruction technique. COMPARISON:  None Available. FINDINGS: Brain: No evidence of acute infarction, hemorrhage, hydrocephalus, extra-axial collection or mass lesion/mass effect. Vascular: No hyperdense vessel or unexpected calcification. Skull: Normal. Negative for fracture or focal lesion. Sinuses/Orbits: Moderate severity left maxillary sinus mucosal thickening is seen. Other: There is mild left parietal scalp soft tissue swelling. IMPRESSION: 1. No acute intracranial abnormality. 2. Mild left parietal scalp soft tissue swelling. 3. Moderate severity left maxillary sinus disease. Electronically Signed   By: Aram Candela M.D.   On: 04/26/2022 00:53   DG Pelvis Portable  Result Date: 04/26/2022 CLINICAL DATA:  Status post fall. EXAM: PORTABLE PELVIS 1-2 VIEWS COMPARISON:  None Available. FINDINGS: There is no evidence of pelvic fracture or diastasis. No pelvic bone lesions are seen. Radiopaque contrast is seen within the lumen of a distended urinary bladder. IMPRESSION: Negative. Electronically Signed   By: Aram Candela M.D.   On: 04/26/2022 00:51   DG Tibia/Fibula Left Port  Result Date: 04/26/2022 CLINICAL DATA:  Status post fall. EXAM: PORTABLE LEFT TIBIA AND FIBULA - 2 VIEW COMPARISON:  None Available. FINDINGS: There is no evidence of an acute fracture or dislocation. Soft tissue structures are unremarkable. IMPRESSION: Negative. Electronically Signed   By: Aram Candela M.D.   On: 04/26/2022 00:50   DG Foot Complete Left  Result Date: 04/26/2022 CLINICAL DATA:  Status post fall. EXAM: LEFT FOOT - COMPLETE 3+ VIEW COMPARISON:  None Available. FINDINGS: There is no evidence of fracture or dislocation. There is no evidence of arthropathy or other focal bone abnormality. Soft tissues are unremarkable. IMPRESSION: Negative. Electronically Signed   By: Aram Candela M.D.   On: 04/26/2022 00:50   DG Wrist Complete Left  Result Date: 04/26/2022 CLINICAL DATA:   Status post fall. EXAM: LEFT WRIST - COMPLETE 3+ VIEW COMPARISON:  None Available. FINDINGS: There is no evidence of acute fracture. The distal left ulna is mildly dorsal in position with respect to the distal left radius. There is no evidence of arthropathy or other focal bone abnormality. Soft tissues are unremarkable. IMPRESSION: 1. No acute fracture. 2. Mild dorsal subluxation of the distal left ulna with respect to the distal left radius. While this may be positional in nature, correlation with physical examination is recommended to exclude posttraumatic injury to the radioulnar ligament. Electronically Signed   By: Aram Candela M.D.   On: 04/26/2022 00:49   DG Chest Port 1 View  Result Date: 04/26/2022 CLINICAL DATA:  Status post trauma. EXAM: PORTABLE CHEST 1 VIEW COMPARISON:  December 22, 2021 FINDINGS: The heart size and mediastinal contours are within normal  limits. Both lungs are clear. The visualized skeletal structures are unremarkable. IMPRESSION: No active disease. Electronically Signed   By: Aram Candela M.D.   On: 04/26/2022 00:47    Procedures Procedures    Medications Ordered in ED Medications  LORazepam (ATIVAN) injection 1 mg (has no administration in time range)  HYDROmorphone (DILAUDID) injection 1 mg (1 mg Intravenous Given 04/26/22 0057)  lactated ringers bolus 1,000 mL (0 mLs Intravenous Stopped 04/26/22 0200)  iohexol (OMNIPAQUE) 350 MG/ML injection 100 mL (100 mLs Intravenous Contrast Given 04/26/22 0047)  sodium chloride 0.9 % bolus 1,000 mL (0 mLs Intravenous Stopped 04/26/22 0336)  HYDROmorphone (DILAUDID) injection 1 mg (1 mg Intravenous Given 04/26/22 0205)  gadobutrol (GADAVIST) 1 MMOL/ML injection 5 mL (5 mLs Intravenous Contrast Given 04/26/22 0316)  oxyCODONE-acetaminophen (PERCOCET/ROXICET) 5-325 MG per tablet 2 tablet (2 tablets Oral Given 04/26/22 0452)  ketorolac (TORADOL) 30 MG/ML injection 15 mg (15 mg Intravenous Given 04/26/22 0452)    ED Course/  Medical Decision Making/ A&P                             Medical Decision Making Amount and/or Complexity of Data Reviewed Labs: ordered. Radiology: ordered. ECG/medicine tests: ordered.  Risk Prescription drug management.   Patient with left wrist swelling and questionable deformity.  X-ray viewed by myself no obvious fracture although it ulna does look a little bit displaced.  Splinted will have her follow-up with hand surgery for that to make sure there is no missed fracture or ligamentous injury. After a fall and left lower extremity neurologic symptoms concern for possible spinal cord injury however she did lay down for a while on that left side after falling and so she could just have a peripheral nerve palsy.  MRIs were negative. Pulse intact. Paresthesias improving some. Patient able to ambulate per nursing.  Pain is controlled.  Will have her follow-up with her primary doctor if the leg issue does not get better.  Otherwise just follow-up with hand surgery.   Final Clinical Impression(s) / ED Diagnoses Final diagnoses:  Fall, initial encounter  Left wrist pain  Hyponatremia    Rx / DC Orders ED Discharge Orders          Ordered    oxyCODONE-acetaminophen (PERCOCET) 5-325 MG tablet  Every 8 hours PRN        04/26/22 0503              Johnston Maddocks, Barbara Cower, MD 04/26/22 [phone]

## 2022-04-26 NOTE — Progress Notes (Signed)
Orthopedic Tech Progress Note Patient Details:  Beth Carr 02/15/1995 [id number]  Patient ID: Beth Carr, female   DOB: [birth year], 27 y.o.   MRN: [id number] I attended trauma page. [name] Post 04/26/2022, 12:18 AM

## 2022-04-26 NOTE — Discharge Instructions (Signed)
As we discussed, the repeat x-ray of your wrist did not reveal any emergent concerns.  I suspect that she do have a fracture or ligament injury in this area and recommend that you follow-up with orthopedics per the provider that saw you last night's recommendations.  Their information is on your paperwork with a number to call to schedule appointment.  Additionally, I suspect that you have a nerve palsy from laying on your leg for an extended period of time.  Your left leg numbness should resolve, however if it does not improve I have given you a referral to a neurologist with a number to call to schedule an appointment for follow-up.  In the interim, please rest, ice, compress, and, and elevate areas that are painful and take Tylenol/ibuprofen as needed for pain as well as the Percocet that last night his provider provided for you.  Return if development of any new or worsening symptoms.

## 2022-04-26 NOTE — Progress Notes (Signed)
   04/26/22 0015  Spiritual Encounters  Type of Visit Attempt (pt unavailable)  Referral source Trauma page  Reason for visit Trauma  OnCall Visit Yes   Patient was taken to CT when chaplain arrived. Staff was notified to reach out if chaplain services were needed.   Note Prepared by Arlyce Dice, Chaplain Resident [phone]

## 2022-04-26 NOTE — ED Triage Notes (Signed)
Pt arrived via [facility] from home. Pt had a fall yesterday and ever since the fall she has had numbness in the left mid calf down to left foot. Pt states whenever she applies pressure to left hip, she gets tingling down left leg. PT states has continued pain in left arm. Pt has 2+ left pedal pulse, cap refill less than 3 sec, warm to touch. Pt able to lift left leg

## 2022-04-26 NOTE — ED Provider Notes (Signed)
[facility] AT [facility] Provider Note   CSN: [id number] Arrival date & time: 04/26/22  1725     History  Chief Complaint  Patient presents with   foot numbness    Beth Carr is a 27 y.o. female.  Patient with noncontributory past medical history presents today with complaints of wrist pain and foot numbness.  She was seen yesterday for same after she sustained a ground level fall and had immediate left leg numbness and paresthesias.  She had a complete workup including MRI of her back yesterday that was unremarkable for acute findings and she was subsequently discharged.  She returns today less than 12 hours later stating that her left foot is still numb and her wrist is still causing her pain.  She denies any new trauma since her discharge.  States she is able to walk with some discomfort.  Her numbness begins below her left knee and extends into her foot.  She describes the sensation "as feeling as though my foot is asleep." Denies any pain to her leg. Does state that when she fell yesterday she landed on her left side and was on the ground for an unknown period of time.  She denies any fevers, chills, or IVDU.  Denies any back pain, loss of bowel or bladder function, or saddle paresthesias.  The history is provided by the patient. No language interpreter was used.       Home Medications Prior to Admission medications   Medication Sig Start Date End Date Taking? Authorizing Provider  acetaminophen (TYLENOL) 500 MG tablet Take 2 tablets (1,000 mg total) by mouth every 8 (eight) hours. Patient taking differently: Take 1,000 mg by mouth every 8 (eight) hours as needed for mild pain or headache. 08/27/19   Sparacino, Hailey L, DO  amphetamine-dextroamphetamine (ADDERALL) 30 MG tablet Take 30 mg by mouth 2 (two) times daily. 11/20/21   [provider]  Blood Pressure Monitoring (BLOOD PRESSURE KIT) DEVI 1 Device by Does not apply route as  needed. 02/15/19   Anyanwu, Jethro Bastos, MD  fluconazole (DIFLUCAN) 150 MG tablet Take 1 tablet today.  Take second tablet 3 days from today. Patient not taking: Reported on 12/22/2021 09/11/20   Theadora Rama Scales, PA-C  hydrOXYzine (ATARAX/VISTARIL) 25 MG tablet Take 1 tablet (25 mg total) by mouth every 8 (eight) hours as needed. Patient not taking: Reported on 12/22/2021 07/05/20   Wynetta Fines, MD  ibuprofen (ADVIL) 200 MG tablet Take 200-400 mg by mouth every 6 (six) hours as needed for mild pain or headache.    [provider]  oxyCODONE-acetaminophen (PERCOCET) 5-325 MG tablet Take 1-2 tablets by mouth every 8 (eight) hours as needed. 04/26/22   Mesner, Barbara Cower, MD  SUBOXONE 8-2 MG FILM Place 1 Film under the tongue 3 (three) times daily.    [provider]  terconazole (TERAZOL 3) 0.8 % vaginal cream Apply to external vulvo-vaginal area twice daily as needed for vulvo-vaginal itching and burning. Patient not taking: Reported on 12/22/2021 09/11/20   Theadora Rama Scales, PA-C  albuterol (PROVENTIL HFA;VENTOLIN HFA) 108 (90 BASE) MCG/ACT inhaler Inhale 2 puffs into the lungs every 4 (four) hours as needed for wheezing or shortness of breath. Patient not taking: Reported on 09/04/2019 12/19/14 01/17/20  Little, Ambrose Finland, MD  hydrochlorothiazide (HYDRODIURIL) 25 MG tablet Take 1 tablet (25 mg total) by mouth daily. 08/09/16 09/03/18  Shirley, [place], DO  omeprazole (PRILOSEC) 20 MG capsule Take 1 capsule (  20 mg total) by mouth daily. Patient not taking: Reported on 09/04/2019 07/16/19 01/17/20  Marny Lowenstein, PA-C  sertraline (ZOLOFT) 50 MG tablet Take 1 tablet (50 mg total) by mouth daily. 10/16/19 01/17/20  Judeth Horn, NP      Allergies    Patient has no known allergies.    Review of Systems   Review of Systems  Musculoskeletal:  Positive for arthralgias.  Neurological:  Positive for numbness.  All other systems reviewed and are negative.   Physical Exam Updated  Vital Signs BP 123/88 (BP Location: Right Arm)   Pulse 97   Temp 98.5 F (36.9 C) (Oral)   Resp 18   Ht 5\' 3"  (1.6 m)   Wt 54.4 kg   LMP 04/18/2022   SpO2 100%   BMI 21.24 kg/m  Physical Exam Vitals and nursing note reviewed.  Constitutional:      General: She is not in acute distress.    Appearance: Normal appearance. She is normal weight. She is not ill-appearing, toxic-appearing or diaphoretic.  HENT:     Head: Normocephalic and atraumatic.  Cardiovascular:     Rate and Rhythm: Normal rate.     Pulses:          Dorsalis pedis pulses are 2+ on the right side and 2+ on the left side.       Posterior tibial pulses are 2+ on the right side and 2+ on the left side.  Pulmonary:     Effort: Pulmonary effort is normal. No respiratory distress.  Musculoskeletal:        General: Normal range of motion.     Cervical back: Normal range of motion.     Comments: Wrist in splint with sling.  Bruising noted to the left lateral thigh. Compartments soft. No obvious deformity.   Skin:    General: Skin is warm and dry.     Capillary Refill: Capillary refill takes less than 2 seconds.  Neurological:     General: No focal deficit present.     Mental Status: She is alert.     Comments: Paresthesias to her left lower leg, insensate up to just below the left knee.  Unable to move her toes on the left leg or range her left ankle, is able to lift her leg off the bed.  Right upper and lower extremities strength and sensation intact. Right arm in wrist splint with distal sensation intact. DP and PT pulses intact and 2+. Capillary refill less than 2 seconds.  Psychiatric:        Mood and Affect: Mood normal.        Behavior: Behavior normal.     ED Results / Procedures / Treatments   Labs (all labs ordered are listed, but only abnormal results are displayed) Labs Reviewed - No data to display  EKG None  Radiology DG Wrist Complete Left  Result Date: 04/26/2022 CLINICAL DATA:  Fall EXAM:  LEFT WRIST - COMPLETE 3+ VIEW COMPARISON:  Earlier today FINDINGS: In splint views. Similar orientation of the distal ulna relative to the distal radius. No fracture. No change since prior study. IMPRESSION: In splint, otherwise no change. Electronically Signed   By: Charlett Nose M.D.   On: 04/26/2022 20:27   MR Lumbar Spine W Wo Contrast  Result Date: 04/26/2022 CLINICAL DATA:  Ataxia with numbness in both legs EXAM: MRI THORACIC AND LUMBAR SPINE WITHOUT AND WITH CONTRAST TECHNIQUE: Multiplanar and multiecho pulse sequences of the thoracic and lumbar spine  were obtained without and with intravenous contrast. CONTRAST:  5mL GADAVIST GADOBUTROL 1 MMOL/ML IV SOLN COMPARISON:  None Available. FINDINGS: MRI THORACIC SPINE FINDINGS Alignment:  Physiologic. Vertebrae: No fracture, evidence of discitis, or bone lesion. Cord:  Normal signal and morphology. Paraspinal and other soft tissues: Negative. Disc levels: No spinal canal stenosis or neural impingement. No abnormal contrast enhancement. MRI LUMBAR SPINE FINDINGS Segmentation:  Standard. Alignment:  Physiologic. Vertebrae:  No fracture, evidence of discitis, or bone lesion. Conus medullaris: Extends to the L1-2 level and appears normal. Paraspinal and other soft tissues: Negative Disc levels: No disc herniation, spinal canal stenosis or neural foraminal stenosis. Visualized sacrum: Normal. No abnormal contrast enhancement. IMPRESSION: Normal MRI of the thoracic and lumbar spine. Electronically Signed   By: Deatra Robinson M.D.   On: 04/26/2022 03:48   MR THORACIC SPINE W WO CONTRAST  Result Date: 04/26/2022 CLINICAL DATA:  Ataxia with numbness in both legs EXAM: MRI THORACIC AND LUMBAR SPINE WITHOUT AND WITH CONTRAST TECHNIQUE: Multiplanar and multiecho pulse sequences of the thoracic and lumbar spine were obtained without and with intravenous contrast. CONTRAST:  5mL GADAVIST GADOBUTROL 1 MMOL/ML IV SOLN COMPARISON:  None Available. FINDINGS: MRI THORACIC SPINE  FINDINGS Alignment:  Physiologic. Vertebrae: No fracture, evidence of discitis, or bone lesion. Cord:  Normal signal and morphology. Paraspinal and other soft tissues: Negative. Disc levels: No spinal canal stenosis or neural impingement. No abnormal contrast enhancement. MRI LUMBAR SPINE FINDINGS Segmentation:  Standard. Alignment:  Physiologic. Vertebrae:  No fracture, evidence of discitis, or bone lesion. Conus medullaris: Extends to the L1-2 level and appears normal. Paraspinal and other soft tissues: Negative Disc levels: No disc herniation, spinal canal stenosis or neural foraminal stenosis. Visualized sacrum: Normal. No abnormal contrast enhancement. IMPRESSION: Normal MRI of the thoracic and lumbar spine. Electronically Signed   By: Deatra Robinson M.D.   On: 04/26/2022 03:48   CT CHEST ABDOMEN PELVIS W CONTRAST  Result Date: 04/26/2022 CLINICAL DATA:  Status post fall. EXAM: CT CHEST, ABDOMEN, AND PELVIS WITH CONTRAST TECHNIQUE: Multidetector CT imaging of the chest, abdomen and pelvis was performed following the standard protocol during bolus administration of intravenous contrast. RADIATION DOSE REDUCTION: This exam was performed according to the departmental dose-optimization program which includes automated exposure control, adjustment of the mA and/or kV according to patient size and/or use of iterative reconstruction technique. CONTRAST:  OMNIPAQUE IOHEXOL 350 MG/ML SOLN COMPARISON:  July 22, 2009 FINDINGS: CT CHEST FINDINGS Cardiovascular: No significant vascular findings. Normal heart size. No pericardial effusion. Mediastinum/Nodes: No enlarged mediastinal, hilar, or axillary lymph nodes. Thyroid gland, trachea, and esophagus demonstrate no significant findings. Lungs/Pleura: Mild atelectasis is seen within the right middle lobe and posterior aspects of the bilateral lung bases. There is no evidence of an acute infiltrate, pleural effusion or pneumothorax. Musculoskeletal: No chest wall  mass or suspicious bone lesions identified. CT ABDOMEN PELVIS FINDINGS Hepatobiliary: There is diffuse fatty infiltration of the liver parenchyma. No focal liver abnormality is seen. The gallbladder is markedly distended. No gallstones, gallbladder wall thickening, or biliary dilatation. Pancreas: Unremarkable. No pancreatic ductal dilatation or surrounding inflammatory changes. Spleen: Normal in size without focal abnormality. Adrenals/Urinary Tract: Adrenal glands are unremarkable. Kidneys are normal, without renal calculi, focal lesion, or hydronephrosis. Bladder is unremarkable. Stomach/Bowel: Stomach is within normal limits. Appendix appears normal. No evidence of bowel wall thickening, distention, or inflammatory changes. Vascular/Lymphatic: No significant vascular findings are present. No enlarged abdominal or pelvic lymph nodes. Reproductive: Uterus and bilateral adnexa  are unremarkable. Other: No abdominal wall hernia or abnormality. No abdominopelvic ascites. Musculoskeletal: No acute or significant osseous findings. IMPRESSION: 1. Mild right middle lobe and bibasilar atelectasis. 2. Hepatic steatosis. 3. No acute or active process within the abdomen or pelvis. Electronically Signed   By: Aram Candela M.D.   On: 04/26/2022 01:00   CT L-SPINE NO CHARGE  Result Date: 04/26/2022 CLINICAL DATA:  Blunt trauma, fall EXAM: CT Thoracic and Lumbar spine with contrast TECHNIQUE: Multiplanar CT images of the thoracic and lumbar spine were reconstructed from contemporary CT of the Chest, Abdomen, and Pelvis. RADIATION DOSE REDUCTION: This exam was performed according to the departmental dose-optimization program which includes automated exposure control, adjustment of the mA and/or kV according to patient size and/or use of iterative reconstruction technique. CONTRAST:  No additional contrast was administered for creation of these reformats. COMPARISON:  None Available. FINDINGS: CT THORACIC SPINE FINDINGS  Alignment: Normal. Vertebrae: No acute fracture or focal pathologic process. Paraspinal and other soft tissues: Negative. Disc levels: Intervertebral disc heights are preserved. Spinal canal is widely patent. No significant neuroforaminal narrowing. CT LUMBAR SPINE FINDINGS Segmentation: 5 lumbar type vertebrae. Alignment: Normal. Vertebrae: No acute fracture or focal pathologic process. Paraspinal and other soft tissues: Negative. Disc levels: Intervertebral disc heights are preserved. Spinal canal is widely patent. No significant neuroforaminal narrowing. No significant facet arthrosis. IMPRESSION: 1. No acute fracture or traumatic listhesis of the thoracolumbar spine. Electronically Signed   By: Helyn Numbers M.D.   On: 04/26/2022 00:59   CT T-SPINE NO CHARGE  Result Date: 04/26/2022 CLINICAL DATA:  Blunt trauma, fall EXAM: CT Thoracic and Lumbar spine with contrast TECHNIQUE: Multiplanar CT images of the thoracic and lumbar spine were reconstructed from contemporary CT of the Chest, Abdomen, and Pelvis. RADIATION DOSE REDUCTION: This exam was performed according to the departmental dose-optimization program which includes automated exposure control, adjustment of the mA and/or kV according to patient size and/or use of iterative reconstruction technique. CONTRAST:  No additional contrast was administered for creation of these reformats. COMPARISON:  None Available. FINDINGS: CT THORACIC SPINE FINDINGS Alignment: Normal. Vertebrae: No acute fracture or focal pathologic process. Paraspinal and other soft tissues: Negative. Disc levels: Intervertebral disc heights are preserved. Spinal canal is widely patent. No significant neuroforaminal narrowing. CT LUMBAR SPINE FINDINGS Segmentation: 5 lumbar type vertebrae. Alignment: Normal. Vertebrae: No acute fracture or focal pathologic process. Paraspinal and other soft tissues: Negative. Disc levels: Intervertebral disc heights are preserved. Spinal canal is widely  patent. No significant neuroforaminal narrowing. No significant facet arthrosis. IMPRESSION: 1. No acute fracture or traumatic listhesis of the thoracolumbar spine. Electronically Signed   By: Helyn Numbers M.D.   On: 04/26/2022 00:59   CT CERVICAL SPINE WO CONTRAST  Result Date: 04/26/2022 CLINICAL DATA:  Status post trauma. EXAM: CT CERVICAL SPINE WITHOUT CONTRAST TECHNIQUE: Multidetector CT imaging of the cervical spine was performed without intravenous contrast. Multiplanar CT image reconstructions were also generated. RADIATION DOSE REDUCTION: This exam was performed according to the departmental dose-optimization program which includes automated exposure control, adjustment of the mA and/or kV according to patient size and/or use of iterative reconstruction technique. COMPARISON:  None Available. FINDINGS: Alignment: Normal. Skull base and vertebrae: No acute fracture. No primary bone lesion or focal pathologic process. Soft tissues and spinal canal: No prevertebral fluid or swelling. No visible canal hematoma. Disc levels: Early anterior osteophyte formation and posterior bony spurring are seen at the level of C6-C7. Normal endplates are seen throughout the  remainder of the cervical spine with normal multilevel intervertebral disc spaces. Normal bilateral multilevel facet joints are noted. Upper chest: Negative. Other: None. IMPRESSION: 1. No acute fracture or subluxation in the cervical spine. 2. Early degenerative changes at the level of C6-C7. Electronically Signed   By: Aram Candela M.D.   On: 04/26/2022 00:55   CT HEAD WO CONTRAST  Result Date: 04/26/2022 CLINICAL DATA:  Status post trauma. EXAM: CT HEAD WITHOUT CONTRAST TECHNIQUE: Contiguous axial images were obtained from the base of the skull through the vertex without intravenous contrast. RADIATION DOSE REDUCTION: This exam was performed according to the departmental dose-optimization program which includes automated exposure control,  adjustment of the mA and/or kV according to patient size and/or use of iterative reconstruction technique. COMPARISON:  None Available. FINDINGS: Brain: No evidence of acute infarction, hemorrhage, hydrocephalus, extra-axial collection or mass lesion/mass effect. Vascular: No hyperdense vessel or unexpected calcification. Skull: Normal. Negative for fracture or focal lesion. Sinuses/Orbits: Moderate severity left maxillary sinus mucosal thickening is seen. Other: There is mild left parietal scalp soft tissue swelling. IMPRESSION: 1. No acute intracranial abnormality. 2. Mild left parietal scalp soft tissue swelling. 3. Moderate severity left maxillary sinus disease. Electronically Signed   By: Aram Candela M.D.   On: 04/26/2022 00:53   DG Pelvis Portable  Result Date: 04/26/2022 CLINICAL DATA:  Status post fall. EXAM: PORTABLE PELVIS 1-2 VIEWS COMPARISON:  None Available. FINDINGS: There is no evidence of pelvic fracture or diastasis. No pelvic bone lesions are seen. Radiopaque contrast is seen within the lumen of a distended urinary bladder. IMPRESSION: Negative. Electronically Signed   By: Aram Candela M.D.   On: 04/26/2022 00:51   DG Tibia/Fibula Left Port  Result Date: 04/26/2022 CLINICAL DATA:  Status post fall. EXAM: PORTABLE LEFT TIBIA AND FIBULA - 2 VIEW COMPARISON:  None Available. FINDINGS: There is no evidence of an acute fracture or dislocation. Soft tissue structures are unremarkable. IMPRESSION: Negative. Electronically Signed   By: Aram Candela M.D.   On: 04/26/2022 00:50   DG Foot Complete Left  Result Date: 04/26/2022 CLINICAL DATA:  Status post fall. EXAM: LEFT FOOT - COMPLETE 3+ VIEW COMPARISON:  None Available. FINDINGS: There is no evidence of fracture or dislocation. There is no evidence of arthropathy or other focal bone abnormality. Soft tissues are unremarkable. IMPRESSION: Negative. Electronically Signed   By: Aram Candela M.D.   On: 04/26/2022 00:50   DG  Wrist Complete Left  Result Date: 04/26/2022 CLINICAL DATA:  Status post fall. EXAM: LEFT WRIST - COMPLETE 3+ VIEW COMPARISON:  None Available. FINDINGS: There is no evidence of acute fracture. The distal left ulna is mildly dorsal in position with respect to the distal left radius. There is no evidence of arthropathy or other focal bone abnormality. Soft tissues are unremarkable. IMPRESSION: 1. No acute fracture. 2. Mild dorsal subluxation of the distal left ulna with respect to the distal left radius. While this may be positional in nature, correlation with physical examination is recommended to exclude posttraumatic injury to the radioulnar ligament. Electronically Signed   By: Aram Candela M.D.   On: 04/26/2022 00:49   DG Chest Port 1 View  Result Date: 04/26/2022 CLINICAL DATA:  Status post trauma. EXAM: PORTABLE CHEST 1 VIEW COMPARISON:  December 22, 2021 FINDINGS: The heart size and mediastinal contours are within normal limits. Both lungs are clear. The visualized skeletal structures are unremarkable. IMPRESSION: No active disease. Electronically Signed   By: Demetrius Revel.D.  On: 04/26/2022 00:47    Procedures Procedures    Medications Ordered in ED Medications - No data to display  ED Course/ Medical Decision Making/ A&P                             Medical Decision Making Amount and/or Complexity of Data Reviewed Radiology: ordered.   Patient presents today with complaints of persistent left lower leg numbness and left wrist pain since fall last night. She is afebrile, nontoxic-appearing, and in no acute distress with reassuring vital signs.  Repeat x-ray imaging of the left wrist unchanged from last night, still questionable deformity.  I have personally reviewed and interpreted this imaging and agree with radiology interpretation.  She is in a splint for this and has been given a referral to hand for follow-up.  She is good distal sensation and cap refill.  No  indication to remove her splint or any further intervention at this time.  Additionally, I have reviewed the imagings that patient had last night which included MRIs of her thoracic and lumbar spine which were unremarkable for acute findings.  She is not an IV drug user and there is no indication to obtain MRI with contrast at this time.  Upon chart review, it appears previous provider suspected that the patient has a peripheral nerve palsy as she was laid on her leg for an unknown amount of time.  Symptoms likely consistent with same. Her numbness is not worsening. There is no signs of vascular compromise or compartment syndrome.  She is able to ambulate. Was given percocet last night for pain. Will also give neurology referral for follow-up of her nerve palsy.  She was given a referral to Ortho last night with a number to call for follow-up for her wrist pain. Evaluation and diagnostic testing in the emergency department does not suggest an emergent condition requiring admission or immediate intervention beyond what has been performed at this time.  Plan for discharge with close PCP follow-up.  Patient is understanding and amenable with plan, educated on red flag symptoms that would prompt immediate return.  Patient discharged in stable condition.  Findings and plan of care discussed with supervising physician Dr. Freida Busman who is in agreement.    Final Clinical Impression(s) / ED Diagnoses Final diagnoses:  Left wrist pain  Left leg numbness  Fall, subsequent encounter    Rx / DC Orders ED Discharge Orders     None     An After Visit Summary was printed and given to the patient.     Vear Clock 04/26/22 2115    Lorre Nick, MD 04/27/22 2257

## 2022-04-27 ENCOUNTER — Encounter: Payer: Self-pay | Admitting: Neurology

## 2022-05-02 ENCOUNTER — Encounter (HOSPITAL_COMMUNITY): Payer: Self-pay

## 2022-05-02 ENCOUNTER — Emergency Department (HOSPITAL_COMMUNITY): Payer: Medicaid Other

## 2022-05-02 ENCOUNTER — Emergency Department (HOSPITAL_COMMUNITY)
Admission: EM | Admit: 2022-05-02 | Discharge: 2022-05-02 | Disposition: A | Discharge status: Home / Self Care | Payer: Medicaid Other | Attending: Emergency Medicine | Admitting: Emergency Medicine

## 2022-05-02 DIAGNOSIS — W1830XA Fall on same level, unspecified, initial encounter: Secondary | ICD-10-CM | POA: Diagnosis not present

## 2022-05-02 DIAGNOSIS — R200 Anesthesia of skin: Secondary | ICD-10-CM | POA: Diagnosis not present

## 2022-05-02 DIAGNOSIS — M25552 Pain in left hip: Secondary | ICD-10-CM | POA: Insufficient documentation

## 2022-05-02 LAB — I-STAT BETA HCG BLOOD, ED (MC, WL, AP ONLY): I-stat hCG, quantitative: <5 m[IU]/mL (ref <5)

## 2022-05-02 MED ORDER — IBUPROFEN 800 MG PO TABS: 800.0000 mg | ORAL_TABLET | Freq: Once | ORAL | Status: DC

## 2022-05-02 MED ORDER — HYDROCODONE-ACETAMINOPHEN 5-325 MG PO TABS
1.0000 | ORAL_TABLET | Freq: Once | ORAL | Status: AC
  Administered 2022-05-02: 1 via ORAL
  Filled 2022-05-02: qty 1

## 2022-05-02 MED ORDER — HYDROCODONE-ACETAMINOPHEN 5-325 MG PO TABS: 2.0000 | ORAL_TABLET | ORAL | 0 refills | Status: DC | PRN

## 2022-05-02 MED ORDER — IBUPROFEN 200 MG PO TABS
600.0000 mg | ORAL_TABLET | Freq: Once | ORAL | Status: AC
  Administered 2022-05-02: 600 mg via ORAL
  Filled 2022-05-02: qty 3

## 2022-05-02 NOTE — ED Provider Notes (Signed)
[place] EMERGENCY DEPARTMENT AT [facility] Provider Note   CSN: [id number] Arrival date & time: 05/02/22  1729     History  Chief Complaint  Patient presents with   Numbness   HPI Beth Carr is a 27 y.o. female presenting for left hip pain and numbness. Symptoms started about a week ago after a mechanical fall from ground-level. She landed on the left side of her body. She has been evaluated twice for same and has been diagnosed with left wrist sprain and left leg numbness but no other acute injuries. Patient endorses history of polysubstance abuse but currently in remission. States she has had immense pain in her left hip. It is a sharp pain that radiates all the way down to her toes.  It has been constant.  States lack of ability to bear weight on the hip.  States her brother has had to help her to and from the bathroom and around the house.  Still endorsing numbness in the left leg.  Denies any other trauma.  Has been taking Tylenol and ibuprofen at home for symptoms.  HPI     Home Medications Prior to Admission medications   Medication Sig Start Date End Date Taking? Authorizing Provider  HYDROcodone-acetaminophen (NORCO/VICODIN) 5-325 MG tablet Take 2 tablets by mouth every 4 (four) hours as needed. 05/02/22  Yes Gareth Eagle, PA-C  acetaminophen (TYLENOL) 500 MG tablet Take 2 tablets (1,000 mg total) by mouth every 8 (eight) hours. Patient taking differently: Take 1,000 mg by mouth every 8 (eight) hours as needed for mild pain or headache. 08/27/19   Sparacino, Hailey L, DO  amphetamine-dextroamphetamine (ADDERALL) 30 MG tablet Take 30 mg by mouth 2 (two) times daily. 11/20/21   [provider]  Blood Pressure Monitoring (BLOOD PRESSURE KIT) DEVI 1 Device by Does not apply route as needed. 02/15/19   Anyanwu, Jethro Bastos, MD  fluconazole (DIFLUCAN) 150 MG tablet Take 1 tablet today.  Take second tablet 3 days from today. Patient not taking: Reported on  12/22/2021 09/11/20   Theadora Rama Scales, PA-C  hydrOXYzine (ATARAX/VISTARIL) 25 MG tablet Take 1 tablet (25 mg total) by mouth every 8 (eight) hours as needed. Patient not taking: Reported on 12/22/2021 07/05/20   Wynetta Fines, MD  ibuprofen (ADVIL) 200 MG tablet Take 200-400 mg by mouth every 6 (six) hours as needed for mild pain or headache.    [provider]  oxyCODONE-acetaminophen (PERCOCET) 5-325 MG tablet Take 1-2 tablets by mouth every 8 (eight) hours as needed. 04/26/22   Mesner, Barbara Cower, MD  SUBOXONE 8-2 MG FILM Place 1 Film under the tongue 3 (three) times daily.    [provider]  terconazole (TERAZOL 3) 0.8 % vaginal cream Apply to external vulvo-vaginal area twice daily as needed for vulvo-vaginal itching and burning. Patient not taking: Reported on 12/22/2021 09/11/20   Theadora Rama Scales, PA-C  albuterol (PROVENTIL HFA;VENTOLIN HFA) 108 (90 BASE) MCG/ACT inhaler Inhale 2 puffs into the lungs every 4 (four) hours as needed for wheezing or shortness of breath. Patient not taking: Reported on 09/04/2019 12/19/14 01/17/20  Little, Ambrose Finland, MD  hydrochlorothiazide (HYDRODIURIL) 25 MG tablet Take 1 tablet (25 mg total) by mouth daily. 08/09/16 09/03/18  Shirley, [place], DO  omeprazole (PRILOSEC) 20 MG capsule Take 1 capsule (20 mg total) by mouth daily. Patient not taking: Reported on 09/04/2019 07/16/19 01/17/20  Marny Lowenstein, PA-C  sertraline (ZOLOFT) 50 MG tablet Take 1 tablet (50 mg  total) by mouth daily. 10/16/19 01/17/20  Judeth Horn, NP      Allergies    Patient has no known allergies.    Review of Systems   See HPI for pertinent positive  Physical Exam Updated Vital Signs BP 132/80 (BP Location: Right Arm)   Pulse 69   Temp 98.5 F (36.9 C) (Oral)   Resp 18   LMP 04/18/2022   SpO2 98%  Physical Exam Constitutional:      Appearance: Normal appearance.  HENT:     Head: Normocephalic.     Nose: Nose normal.  Eyes:     Conjunctiva/sclera:  Conjunctivae normal.  Pulmonary:     Effort: Pulmonary effort is normal.  Musculoskeletal:     Right hip: Normal. No deformity or lacerations. Normal range of motion.     Left hip: Bony tenderness present. No deformity, lacerations or crepitus. Decreased range of motion. Normal strength.     Comments: Small hematoma noted to the left lateral hip  Having difficulty bearing weight on the left leg while attempting to ambulate.  Constantly propping her hand onto the bed.  Gait is unsteady.  Neurological:     Mental Status: She is alert.  Psychiatric:        Mood and Affect: Mood normal.     ED Results / Procedures / Treatments   Labs (all labs ordered are listed, but only abnormal results are displayed) Labs Reviewed  I-STAT BETA HCG BLOOD, ED ([facility], [facility], [facility] ONLY)    EKG None  Radiology CT Hip Left Wo Contrast  Result Date: 05/02/2022 CLINICAL DATA:  Hip trauma, fell 3 days ago. EXAM: CT OF THE LEFT HIP WITHOUT CONTRAST TECHNIQUE: Multidetector CT imaging of the left hip was performed according to the standard protocol. Multiplanar CT image reconstructions were also generated. RADIATION DOSE REDUCTION: This exam was performed according to the departmental dose-optimization program which includes automated exposure control, adjustment of the mA and/or kV according to patient size and/or use of iterative reconstruction technique. COMPARISON:  CT chest abdomen and pelvis 04/26/2022. FINDINGS: Bones/Joint/Cartilage There is a questionable subtle linear lucency through the left sacral ala which may represent nondisplaced fracture. No dislocation. Joint spaces are well maintained. Ligaments Suboptimally assessed by CT. Muscles and Tendons There some intramuscular edema in the inferior left gluteal region. Soft tissues There is subcutaneous edema lateral the left hip. There is also new presacral edema. IMPRESSION: 1. Questionable nondisplaced fracture of the left sacral ala. Correlate for point  tenderness. 2. Subcutaneous edema lateral the left hip. 3. New presacral edema. 4. Intramuscular edema in the inferior left gluteal region. Electronically Signed   By: Darliss Cheney M.D.   On: 05/02/2022 20:53    Procedures Procedures    Medications Ordered in ED Medications  HYDROcodone-acetaminophen (NORCO/VICODIN) 5-325 MG per tablet 1 tablet (has no administration in time range)  ibuprofen (ADVIL) tablet 600 mg (600 mg Oral Given 05/02/22 1948)    ED Course/ Medical Decision Making/ A&P                             Medical Decision Making Amount and/or Complexity of Data Reviewed Radiology: ordered.  Risk OTC drugs.  27 year old female presenting for left hip pain and numbness.  This is her third encounter for same.  Exam is remarkable for limited mobility in the left hip due to pain inability to bear weight on the left leg.  This ultimately prompted further  evaluation with CT scan of her hip which revealed possible sacral ala fracture and associated edema.  Discussed patient with Dr. Victorino Dike of orthopedics who advised that for now there is no emergent need for MRI but plan would be nonweightbearing with crutches and follow-up with orthopedics.  Treat her pain with ibuprofen and Norco.  Also recommend that she continue taking gabapentin for pain.  I did give her a few tablets of Norco for acute pain in next couple days.  Vital stable at discharge.        Final Clinical Impression(s) / ED Diagnoses Final diagnoses:  Left hip pain    Rx / DC Orders ED Discharge Orders          Ordered    HYDROcodone-acetaminophen (NORCO/VICODIN) 5-325 MG tablet  Every 4 hours PRN        05/02/22 2159              Gareth Eagle, PA-C 05/02/22 2203    Terald Sleeper, MD 05/02/22 [phone]

## 2022-05-02 NOTE — ED Notes (Signed)
Pt is in [facility], await updated AVS with referral to ortho and then pt will be ready to go

## 2022-05-02 NOTE — ED Notes (Signed)
Pt is in significant pain when transferring.  Crutches given

## 2022-05-02 NOTE — ED Triage Notes (Signed)
Pt returns after being seen x 2 at [facility]. Pt states that she continues to have pain deep in her "butt bone". Pt states that pain goes from her left hip to her right hip into her pelvic area.

## 2022-05-02 NOTE — Discharge Instructions (Signed)
Evaluation for your left hip pain revealed that you likely have a sacral fracture.  At this time treatment will be nonweightbearing with crutches.  Also sent a few tablets of Norco for acute pain in the next couple days.  Recommend you follow-up with Ortho in the next couple weeks for reevaluation.  Symptoms should improve within the next 4 to 6 weeks provided you do not bear any weight on that leg in the meantime.  He can also treat the pain conservatively with ice.

## 2022-05-06 ENCOUNTER — Ambulatory Visit: Payer: Medicaid Other | Admitting: Physician Assistant

## 2022-05-06 ENCOUNTER — Telehealth: Payer: Self-pay

## 2022-05-06 NOTE — Telephone Encounter (Signed)
ERROR

## 2022-05-12 ENCOUNTER — Ambulatory Visit: Payer: Medicaid Other | Admitting: Neurology

## 2022-05-12 ENCOUNTER — Encounter: Payer: Self-pay | Admitting: Neurology

## 2022-05-12 VITALS — BP 140/80 | HR 78 | Resp 20 | Ht 63.0 in | Wt 140.0 lb

## 2022-05-12 DIAGNOSIS — S7402XA Injury of sciatic nerve at hip and thigh level, left leg, initial encounter: Secondary | ICD-10-CM

## 2022-05-12 DIAGNOSIS — G8314 Monoplegia of lower limb affecting left nondominant side: Secondary | ICD-10-CM | POA: Diagnosis not present

## 2022-05-12 MED ORDER — DULOXETINE HCL 30 MG PO CPEP: 30.0000 mg | ORAL_CAPSULE | Freq: Every day | ORAL | 3 refills | Status: AC

## 2022-05-12 NOTE — Patient Instructions (Addendum)
Nerve testing of the left leg  MRI pelvis wwo contrast  Start physical therapy  Follow-up with orthopaedics
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
## 2022-05-21 ENCOUNTER — Other Ambulatory Visit: Payer: Self-pay

## 2022-05-21 ENCOUNTER — Emergency Department (HOSPITAL_COMMUNITY): Payer: Medicaid Other

## 2022-05-21 ENCOUNTER — Encounter (HOSPITAL_COMMUNITY): Payer: Self-pay | Admitting: Emergency Medicine

## 2022-05-21 ENCOUNTER — Encounter: Payer: Medicaid Other | Admitting: Neurology

## 2022-05-21 ENCOUNTER — Encounter: Payer: Self-pay | Admitting: Neurology

## 2022-05-21 ENCOUNTER — Emergency Department (HOSPITAL_COMMUNITY)
Admission: EM | Admit: 2022-05-21 | Discharge: 2022-05-22 | Disposition: A | Discharge status: Home / Self Care | Payer: Medicaid Other | Attending: Emergency Medicine | Admitting: Emergency Medicine

## 2022-05-21 DIAGNOSIS — Z79899 Other long term (current) drug therapy: Secondary | ICD-10-CM | POA: Insufficient documentation

## 2022-05-21 DIAGNOSIS — M79602 Pain in left arm: Secondary | ICD-10-CM | POA: Diagnosis not present

## 2022-05-21 DIAGNOSIS — M25552 Pain in left hip: Secondary | ICD-10-CM | POA: Insufficient documentation

## 2022-05-21 DIAGNOSIS — R2232 Localized swelling, mass and lump, left upper limb: Secondary | ICD-10-CM | POA: Insufficient documentation

## 2022-05-21 DIAGNOSIS — R531 Weakness: Secondary | ICD-10-CM | POA: Insufficient documentation

## 2022-05-21 DIAGNOSIS — G6289 Other specified polyneuropathies: Secondary | ICD-10-CM

## 2022-05-21 DIAGNOSIS — M79605 Pain in left leg: Secondary | ICD-10-CM | POA: Insufficient documentation

## 2022-05-21 NOTE — ED Triage Notes (Signed)
Pt arrives via POV in wheelchair to triage. Pt c/o left sided hip pain that has been ongoing since initial injury April 19th. Pain worsening today. Also c/o left foot pain that didn't get evaluated at time of injury. No new injury/trauma.

## 2022-05-22 MED ORDER — GABAPENTIN 300 MG PO CAPS: 300.0000 mg | ORAL_CAPSULE | Freq: Every day | ORAL | 0 refills | Status: DC

## 2022-05-22 NOTE — ED Provider Notes (Signed)
[place] [facility]ERGENCY DEPARTMENT AT [facility] Provider Note   CSN: [id number] Arrival date & time: 05/21/22  2244     History  Chief Complaint  Patient presents with   Hip Pain    Beth Carr is a 27 y.o. female.  Patient presents to the emergency department for evaluation of pain in her left arm and leg has been persistent.  Patient has been seen for this previously.  She has been taking ibuprofen around-the-clock without improvement.       Home Medications Prior to Admission medications   Medication Sig Start Date End Date Taking? Authorizing Provider  gabapentin (NEURONTIN) 300 MG capsule Take 1 capsule (300 mg total) by mouth at bedtime. 05/22/22  Yes Ayerim Berquist, Canary Brim, MD  acetaminophen (TYLENOL) 500 MG tablet Take 2 tablets (1,000 mg total) by mouth every 8 (eight) hours. Patient taking differently: Take 1,000 mg by mouth every 8 (eight) hours as needed for mild pain or headache. 08/27/19   Sparacino, Hailey L, DO  amphetamine-dextroamphetamine (ADDERALL) 30 MG tablet Take 30 mg by mouth 2 (two) times daily. 11/20/21   [provider]  Blood Pressure Monitoring (BLOOD PRESSURE KIT) DEVI 1 Device by Does not apply route as needed. 02/15/19   Anyanwu, Jethro Bastos, MD  DULoxetine (CYMBALTA) 30 MG capsule Take 1 capsule (30 mg total) by mouth daily. 05/12/22   Nita Sickle K, DO  ibuprofen (ADVIL) 200 MG tablet Take 200-400 mg by mouth every 6 (six) hours as needed for mild pain or headache.    [provider]  SUBOXONE 8-2 MG FILM Place 1 Film under the tongue 3 (three) times daily. Patient not taking: Reported on 05/12/2022    [provider]  albuterol (PROVENTIL HFA;VENTOLIN HFA) 108 (90 BASE) MCG/ACT inhaler Inhale 2 puffs into the lungs every 4 (four) hours as needed for wheezing or shortness of breath. Patient not taking: Reported on 09/04/2019 12/19/14 01/17/20  Little, Ambrose Finland, MD  hydrochlorothiazide (HYDRODIURIL) 25 MG  tablet Take 1 tablet (25 mg total) by mouth daily. 08/09/16 09/03/18  Shirley, [place], DO  omeprazole (PRILOSEC) 20 MG capsule Take 1 capsule (20 mg total) by mouth daily. Patient not taking: Reported on 09/04/2019 07/16/19 01/17/20  Marny Lowenstein, PA-C  sertraline (ZOLOFT) 50 MG tablet Take 1 tablet (50 mg total) by mouth daily. 10/16/19 01/17/20  Judeth Horn, NP      Allergies    Patient has no known allergies.    Review of Systems   Review of Systems  Physical Exam Updated Vital Signs BP (!) 123/107   Pulse (!) 112   Temp 98.8 F (37.1 C)   Resp 18   Ht 5\' 3"  (1.6 m)   Wt 63.5 kg   LMP 04/18/2022   SpO2 98%   BMI 24.80 kg/m  Physical Exam Vitals and nursing note reviewed.  Constitutional:      General: She is not in acute distress.    Appearance: She is well-developed.  HENT:     Head: Normocephalic and atraumatic.     Mouth/Throat:     Mouth: Mucous membranes are moist.  Eyes:     General: Vision grossly intact. Gaze aligned appropriately.     Extraocular Movements: Extraocular movements intact.     Conjunctiva/sclera: Conjunctivae normal.  Cardiovascular:     Rate and Rhythm: Normal rate and regular rhythm.     Pulses: Normal pulses.          Dorsalis pedis pulses are  2+ on the right side and 2+ on the left side.     Heart sounds: Normal heart sounds, S1 normal and S2 normal. No murmur heard.    No friction rub. No gallop.  Pulmonary:     Effort: Pulmonary effort is normal. No respiratory distress.     Breath sounds: Normal breath sounds.  Abdominal:     General: Bowel sounds are normal.     Palpations: Abdomen is soft.     Tenderness: There is no abdominal tenderness. There is no guarding or rebound.     Hernia: No hernia is present.  Musculoskeletal:        General: No swelling.     Left forearm: Swelling present.     Left wrist: Swelling present.     Left hand: Swelling present.     Cervical back: Full passive range of motion without pain, normal range  of motion and neck supple. No spinous process tenderness or muscular tenderness. Normal range of motion.     Right lower leg: No edema.     Left lower leg: No edema.     Comments: Compartments are soft, capillary refill is normal, pulses are palpable  Skin:    General: Skin is warm and dry.     Capillary Refill: Capillary refill takes less than 2 seconds.     Findings: No ecchymosis, erythema, rash or wound.  Neurological:     General: No focal deficit present.     Mental Status: She is alert and oriented to person, place, and time.     GCS: GCS eye subscore is 4. GCS verbal subscore is 5. GCS motor subscore is 6.     Cranial Nerves: Cranial nerves 2-12 are intact.     Motor: Weakness (Left upper, left lower extremity) present.     Coordination: Coordination is intact.  Psychiatric:        Attention and Perception: Attention normal.        Mood and Affect: Mood normal.        Speech: Speech normal.        Behavior: Behavior normal.     ED Results / Procedures / Treatments   Labs (all labs ordered are listed, but only abnormal results are displayed) Labs Reviewed - No data to display  EKG None  Radiology DG Hip Unilat With Pelvis 2-3 Views Left  Result Date: 05/21/2022 CLINICAL DATA:  Left hip pain, previous trauma 04/23/2022 EXAM: DG HIP (WITH OR WITHOUT PELVIS) 2-3V LEFT COMPARISON:  05/02/2022 FINDINGS: Frontal view of the pelvis as well as frontal and frogleg lateral views of the left hip are obtained. No acute displaced fracture, subluxation, or dislocation. Joint spaces are well preserved. The sacroiliac joints are normal. IMPRESSION: 1. Unremarkable pelvis and left hip. Electronically Signed   By: Sharlet Salina M.D.   On: 05/21/2022 23:43   DG Ankle Complete Left  Result Date: 05/21/2022 CLINICAL DATA:  Left ankle pain EXAM: LEFT ANKLE COMPLETE - 3+ VIEW COMPARISON:  None Available. FINDINGS: There is no evidence of fracture, dislocation, or joint effusion. There is no  evidence of arthropathy or other focal bone abnormality. Soft tissues are unremarkable. IMPRESSION: Negative. Electronically Signed   By: Helyn Numbers M.D.   On: 05/21/2022 23:43   DG Foot Complete Left  Result Date: 05/21/2022 CLINICAL DATA:  Left foot pain EXAM: LEFT FOOT - COMPLETE 3+ VIEW COMPARISON:  None Available. FINDINGS: There is no evidence of fracture or dislocation. There is no evidence  of arthropathy or other focal bone abnormality. Soft tissues are unremarkable. IMPRESSION: Negative. Electronically Signed   By: Helyn Numbers M.D.   On: 05/21/2022 23:42    Procedures Procedures    Medications Ordered in ED Medications - No data to display  ED Course/ Medical Decision Making/ A&P                             Medical Decision Making Amount and/or Complexity of Data Reviewed Radiology: ordered.   Patient presents with concerns over persistent pain in her left arm and leg.  I have reviewed her extensive records.  Patient was seen initially on April 22 after her she was on the ground for 5 hours.Patient had extensive workup at that time including MRI thoracic and lumbar spine.  She was followed up by neurology.  She was scheduled to follow-up with orthopedics for her hand and wrist pain and swelling.  An order was also placed for MRI of pelvis as well as EMG of the leg.  She has not scheduled this yet.  Patient is here with concerns that there may be a fracture and asking for pain meds.  X-rays are unremarkable.  CT scan performed at another ED visit raised question for possible sacral ala fracture.  Examination today does show swelling of her arm and leg with decreased sensation and motor function that is concerning for nerve injury.  This was present at her previous neurology visit 10 days ago as well.  I do not see anything new today.  The symptoms have been ongoing for some time.  Stressed with her that she needs to follow-up with neurology, get the additional studies as  well as follow-up with orthopedics.  Will try Neurontin for her neuropathic pain.        Final Clinical Impression(s) / ED Diagnoses Final diagnoses:  Other polyneuropathy    Rx / DC Orders ED Discharge Orders          Ordered    gabapentin (NEURONTIN) 300 MG capsule  Daily at bedtime        05/22/22 0032              Gilda Crease, MD 05/22/22 [phone]

## 2022-06-14 ENCOUNTER — Other Ambulatory Visit: Payer: Self-pay

## 2022-06-14 ENCOUNTER — Encounter (HOSPITAL_COMMUNITY): Payer: Self-pay

## 2022-06-14 ENCOUNTER — Emergency Department (HOSPITAL_COMMUNITY)
Admission: EM | Admit: 2022-06-14 | Discharge: 2022-06-15 | Discharge status: Against Medical Advice | Payer: Medicaid Other | Attending: Emergency Medicine | Admitting: Emergency Medicine

## 2022-06-14 DIAGNOSIS — R4182 Altered mental status, unspecified: Secondary | ICD-10-CM | POA: Insufficient documentation

## 2022-06-14 DIAGNOSIS — F1290 Cannabis use, unspecified, uncomplicated: Secondary | ICD-10-CM | POA: Insufficient documentation

## 2022-06-14 DIAGNOSIS — M79662 Pain in left lower leg: Secondary | ICD-10-CM | POA: Diagnosis not present

## 2022-06-14 DIAGNOSIS — R42 Dizziness and giddiness: Secondary | ICD-10-CM | POA: Insufficient documentation

## 2022-06-14 DIAGNOSIS — Z5321 Procedure and treatment not carried out due to patient leaving prior to being seen by health care provider: Secondary | ICD-10-CM | POA: Diagnosis not present

## 2022-06-14 DIAGNOSIS — J45909 Unspecified asthma, uncomplicated: Secondary | ICD-10-CM | POA: Diagnosis not present

## 2022-06-14 DIAGNOSIS — R000 Tachycardia, unspecified: Secondary | ICD-10-CM | POA: Diagnosis not present

## 2022-06-14 LAB — I-STAT BETA HCG BLOOD, ED (MC, WL, AP ONLY): I-stat hCG, quantitative: <5 m[IU]/mL (ref <5)

## 2022-06-14 NOTE — ED Triage Notes (Signed)
Pt arrives to ED with altered mental status. Pt rambling and then dozing off during triage but easily aroused. Pt's visitor states she was having a headache and she took motrin. Pt endorses marijuana use tonight. Pt states she feels dizzy but no longer has headache.

## 2022-06-15 ENCOUNTER — Encounter (HOSPITAL_COMMUNITY): Payer: Self-pay

## 2022-06-15 ENCOUNTER — Emergency Department (HOSPITAL_COMMUNITY)
Admission: EM | Admit: 2022-06-15 | Discharge: 2022-06-15 | Disposition: A | Discharge status: Home / Self Care | Payer: Medicaid Other | Source: Home / Self Care | Attending: Emergency Medicine | Admitting: Emergency Medicine

## 2022-06-15 ENCOUNTER — Other Ambulatory Visit: Payer: Self-pay

## 2022-06-15 DIAGNOSIS — J45909 Unspecified asthma, uncomplicated: Secondary | ICD-10-CM | POA: Insufficient documentation

## 2022-06-15 DIAGNOSIS — M79662 Pain in left lower leg: Secondary | ICD-10-CM | POA: Insufficient documentation

## 2022-06-15 DIAGNOSIS — R000 Tachycardia, unspecified: Secondary | ICD-10-CM | POA: Insufficient documentation

## 2022-06-15 DIAGNOSIS — M79605 Pain in left leg: Secondary | ICD-10-CM

## 2022-06-15 LAB — COMPREHENSIVE METABOLIC PANEL
ALT: 21 U/L (ref 0–44)
AST: 21 U/L (ref 15–41)
Albumin: 4.4 g/dL (ref 3.5–5.0)
Alkaline Phosphatase: 52 U/L (ref 38–126)
Anion gap: 17 — ABNORMAL HIGH (ref 5–15)
BUN: 13 mg/dL (ref 6–20)
CO2: 18 mmol/L — ABNORMAL LOW (ref 22–32)
Calcium: 9 mg/dL (ref 8.9–10.3)
Chloride: 103 mmol/L (ref 98–111)
Creatinine, Ser: 0.9 mg/dL (ref 0.44–1.00)
GFR, Estimated: >60 mL/min (ref >60)
Glucose, Bld: 128 mg/dL — ABNORMAL HIGH (ref 70–99)
Potassium: 3.1 mmol/L — ABNORMAL LOW (ref 3.5–5.1)
Sodium: 138 mmol/L (ref 135–145)
Total Bilirubin: 0.8 mg/dL (ref 0.3–1.2)
Total Protein: 7 g/dL (ref 6.5–8.1)

## 2022-06-15 LAB — CBC
HCT: 42.2 % (ref 36.0–46.0)
Hemoglobin: 15 g/dL (ref 12.0–15.0)
MCH: 34.6 pg — ABNORMAL HIGH (ref 26.0–34.0)
MCHC: 35.5 g/dL (ref 30.0–36.0)
MCV: 97.2 fL (ref 80.0–100.0)
Platelets: 334 10*3/uL (ref 150–400)
RBC: 4.34 MIL/uL (ref 3.87–5.11)
RDW: 12.3 % (ref 11.5–15.5)
WBC: 12.6 10*3/uL — ABNORMAL HIGH (ref 4.0–10.5)
nRBC: 0 % (ref 0.0–0.2)

## 2022-06-15 MED ORDER — GABAPENTIN 300 MG PO CAPS: 300.0000 mg | ORAL_CAPSULE | Freq: Three times a day (TID) | ORAL | 0 refills | Status: AC

## 2022-06-15 MED ORDER — GABAPENTIN 300 MG PO CAPS
300.0000 mg | ORAL_CAPSULE | Freq: Once | ORAL | Status: AC
  Administered 2022-06-15: 300 mg via ORAL
  Filled 2022-06-15: qty 1

## 2022-06-15 NOTE — Discharge Instructions (Addendum)
Concerned that you have damaged your sciatic nerve, I have given you a brace please wear during the day you may take off at nighttime.  Given gabapentin please take as prescribed.  Please follow-up with neurology for further evaluation   I reviewed your notes it is recommend that you remain nonweightbearing on your left leg as it is possible you have a small fracture in your hip, follow-up with orthopedic surgery for further evaluation given contact information.  Come back to the emergency department if you develop chest pain, shortness of breath, severe abdominal pain, uncontrolled nausea, vomiting, diarrhea.

## 2022-06-15 NOTE — ED Triage Notes (Addendum)
Patient reports having falling 1 month ago and is having continued left hip pain. Reports unable to bear weight on leg. Patient reports the left foot always feels numb and cold.   Denies new injury.

## 2022-06-15 NOTE — ED Provider Notes (Signed)
[place] EMERGENCY DEPARTMENT AT [facility] Provider Note   CSN: [id number] Arrival date & time: 06/15/22  0031     History  Chief Complaint  Patient presents with   Leg Pain    Beth Carr is a 27 y.o. female.  HPI   Patient with medical history including asthma, chronic pain, polysubstance use, presenting with complaints of left lower leg and foot pain.  Patient states that she has been doing this for the last month, states that is remain unchanged, no new trauma to the area, she states that she mainly feels pain on her foot, will  occasionally feel very cold, she states she has difficulty dorsiflexing or plantar flexing, difficulty flexing extending at the toes, states she is no difficulty with flexion extension at the knee and hip, she states that she has decreased sensation from the left knee down.  She denies any saddle paresthesias urinary or bowel incontinence, denies any worsening back pain.  Patient states that she is here because she needs help with the pain as she is concerned she will be unable to manage this when she was a prison later today.  Reviewed patient's chart been seen in the past for similar presentation, was initially seen on 04/21 after a fall, she was found down for 5 hours, patient had MRI of the thoracic and lumbar spine, both which were negative for acute findings, patient seen about a week later on the 28th endorsing worsening pain in the left hip and was unable to ambulate, CT scan was obtained showing questionable nondisplaced fracture of the left sacral ala, this was discussed with orthopedic surgery no emergent MRI is needed, keep her nonweightbearing and she should follow-up with orthopedics appears patient never followed up.  Patient was seen by neurology on 05/08 further concern for sciatic nerve damage versus lumbar sacral plexus injury and she was ordered MRI pelvis as well as nerve testing.  patient was seen again on 05/18, with  continued left leg pain, plain films of the left hip left foot and ankle are all negative acute findings.    ome Medications Prior to Admission medications   Medication Sig Start Date End Date Taking? Authorizing Provider  gabapentin (NEURONTIN) 300 MG capsule Take 1 capsule (300 mg total) by mouth 3 (three) times daily for 7 days. 06/15/22 06/22/22 Yes Carroll Sage, PA-C  acetaminophen (TYLENOL) 500 MG tablet Take 2 tablets (1,000 mg total) by mouth every 8 (eight) hours. Patient taking differently: Take 1,000 mg by mouth every 8 (eight) hours as needed for mild pain or headache. 08/27/19   Sparacino, Hailey L, DO  amphetamine-dextroamphetamine (ADDERALL) 30 MG tablet Take 30 mg by mouth 2 (two) times daily. 11/20/21   [provider]  Blood Pressure Monitoring (BLOOD PRESSURE KIT) DEVI 1 Device by Does not apply route as needed. 02/15/19   Anyanwu, Jethro Bastos, MD  DULoxetine (CYMBALTA) 30 MG capsule Take 1 capsule (30 mg total) by mouth daily. 05/12/22   Nita Sickle K, DO  ibuprofen (ADVIL) 200 MG tablet Take 200-400 mg by mouth every 6 (six) hours as needed for mild pain or headache.    [provider]  SUBOXONE 8-2 MG FILM Place 1 Film under the tongue 3 (three) times daily. Patient not taking: Reported on 05/12/2022    [provider]  albuterol (PROVENTIL HFA;VENTOLIN HFA) 108 (90 BASE) MCG/ACT inhaler Inhale 2 puffs into the lungs every 4 (four) hours as needed for wheezing or shortness of  breath. Patient not taking: Reported on 09/04/2019 12/19/14 01/17/20  Little, Ambrose [place], MD  hydrochlorothiazide (HYDRODIURIL) 25 MG tablet Take 1 tablet (25 mg total) by mouth daily. 08/09/16 09/03/18  Shirley, [place], DO  omeprazole (PRILOSEC) 20 MG capsule Take 1 capsule (20 mg total) by mouth daily. Patient not taking: Reported on 09/04/2019 07/16/19 01/17/20  Marny Lowenstein, PA-C  sertraline (ZOLOFT) 50 MG tablet Take 1 tablet (50 mg total) by mouth daily. 10/16/19 01/17/20   Judeth Horn, NP      Allergies    Patient has no known allergies.    Review of Systems   Review of Systems  Constitutional:  Negative for chills and fever.  Respiratory:  Negative for shortness of breath.   Cardiovascular:  Negative for chest pain.  Gastrointestinal:  Negative for abdominal pain.  Musculoskeletal:        Left foot pain  Neurological:  Negative for headaches.    Physical Exam Updated Vital Signs BP 137/82 (BP Location: Right Arm)   Pulse (!) 111   Temp 98.1 F (36.7 C)   Resp 17   SpO2 97%  Physical Exam Vitals and nursing note reviewed.  Constitutional:      General: She is not in acute distress.    Appearance: She is not ill-appearing.  HENT:     Head: Normocephalic and atraumatic.     Nose: No congestion.  Eyes:     Conjunctiva/sclera: Conjunctivae normal.  Cardiovascular:     Rate and Rhythm: Regular rhythm. Tachycardia present.     Pulses: Normal pulses.     Heart sounds: No murmur heard.    No friction rub. No gallop.  Pulmonary:     Effort: No respiratory distress.     Breath sounds: No wheezing, rhonchi or rales.  Musculoskeletal:     Comments: Spine was palpated was nontender to palpation no step-off deformities noted, there is no overlying skin changes.  There is no pelvis instability no leg shortening.  Patient has notable atrophy of the left leg most significant in the left calf, minimally able dorsiflex or plantarflex at the ankle, has minimal flexors and extension at the toes, she has full range of motion of the knee and hip.  She has subjective decrease sensation from the left knee down, but the leg is warm to the touch, 2+ radial pulses, 2+ capillary refill.  Please see picture for full detail    Skin:    General: Skin is warm and dry.  Neurological:     Mental Status: She is alert.  Psychiatric:        Mood and Affect: Mood normal.        ED Results / Procedures / Treatments   Labs (all labs ordered are listed, but only  abnormal results are displayed) Labs Reviewed - No data to display  EKG None  Radiology No results found.  Procedures Procedures    Medications Ordered in ED Medications  gabapentin (NEURONTIN) capsule 300 mg (has no administration in time range)    ED Course/ Medical Decision Making/ A&P                             Medical Decision Making Risk Prescription drug management.   This patient presents to the ED for concern of left leg pain, this involves an extensive number of treatment options, and is a complaint that carries with it a high risk of complications and morbidity.  The differential diagnosis includes vascular injury, compartment syndrome, spinal equina fracture,    Additional history obtained:  Additional history obtained from partner at bedside External records from outside source obtained and reviewed including neurology notes   Co morbidities that complicate the patient evaluation  Polysubstance use  Social Determinants of Health:  No primary care provider    Lab Tests:  I Ordered, and personally interpreted labs.  The pertinent results include: N/A   Imaging Studies ordered:  I ordered imaging studies including N/A I independently visualized and interpreted imaging which showed N/A I agree with the radiologist interpretation   Cardiac Monitoring:  The patient was maintained on a cardiac monitor.  I personally viewed and interpreted the cardiac monitored which showed an underlying rhythm of: N/A   Medicines ordered and prescription drug management:  I ordered medication including gabapentin I have reviewed the patients home medicines and have made adjustments as needed  Critical Interventions:  N/A   Reevaluation:  Present with left leg pain, she has notable atrophy of the left lower leg, suspect patient has possible nerve palsy, will consult with neurology for further recommendations  Reassessed the patient, patient was found  asleep, having no complaints, agreement discharge at this time  Consultations Obtained:  I requested consultation with the Dr. Amada Jupiter ,  and discussed lab and imaging findings as well as pertinent plan - they recommend: Since incident happened a month ago poss patient's permanent nerve damage, does not feel patient needs emergent imaging at this time he can be follow-up as an outpatient does recommend a brace for ankle.   Test Considered:  MRI lumbar spine-defers my suspicion for spine equina is very low at this time, presentation atypical, no saddle paresthesias no urinary or bowel incontinency, patient's had MRI in the past which was negative.  There is no new symptoms.   Rule out Suspicion for limb ischemia is low at this time, she has 2+ radial pulses to send capillary refill, limb is warm to the touch.  Patient does have decreased strength in the left foot suspect is likely from nerve palsy.  I doubt fracture she denies any traumatic injury to the area, there is no gross deformities present my exam.  I doubt compartment syndrome as all compartments are soft nontender.  I doubt infectious etiology as she is nontoxic-appearing, no evidence infection present my exam.    Dispostion and problem list  After consideration of the diagnostic results and the patients response to treatment, I feel that the patent would benefit from discharge.   Left leg pain-likely patient suffered sciatic nerve damage, will provide patient with a brace, discharged home with gabapentin.  Will have her continue to remain nonweightbearing encouraged her to follow-up with orthopedic surgery for further evaluation of her hip pain.          Final Clinical Impression(s) / ED Diagnoses Final diagnoses:  Left leg pain    Rx / DC Orders ED Discharge Orders          Ordered    gabapentin (NEURONTIN) 300 MG capsule  3 times daily        06/15/22 0659    Ambulatory referral to Home Health        Comments: Please evaluate Rylea A Beller for admission to Home Health.  Disciplines requested: Physical Therapy  Services to provide: Evaluate  Physician to follow patient's care (the person listed here will be responsible for signing ongoing orders): Other: PCP  Requested Start of Care  Date: Within 2-3 days  I certify that this patient is under my care and that I, or a Nurse Practitioner or Physician's Assistant working with me, had a face-to-face encounter that meets the physician face-to-face requirements with patient on 06/15/2022. The encounter with the patient was in whole, or in part for the following medical condition(s) which is the primary reason for home health care (List medical condition).  Patient has significant pain and weakness in the left leg inhibiting her from ambulate ambulation making her a fall risk.  Special Instructions:   06/15/22 0704              Carroll Sage, PA-C 06/15/22 0865    Gloris Manchester, MD 06/16/22 [phone]

## 2022-07-12 ENCOUNTER — Ambulatory Visit: Payer: MEDICAID | Attending: Neurology | Admitting: Physical Therapy

## 2022-07-14 ENCOUNTER — Emergency Department (HOSPITAL_COMMUNITY): Payer: MEDICAID

## 2022-07-14 ENCOUNTER — Encounter (HOSPITAL_COMMUNITY): Payer: Self-pay

## 2022-07-14 ENCOUNTER — Emergency Department (HOSPITAL_COMMUNITY)
Admission: EM | Admit: 2022-07-14 | Discharge: 2022-07-14 | Disposition: A | Discharge status: Home / Self Care | Payer: MEDICAID | Attending: Emergency Medicine | Admitting: Emergency Medicine

## 2022-07-14 DIAGNOSIS — R531 Weakness: Secondary | ICD-10-CM | POA: Insufficient documentation

## 2022-07-14 DIAGNOSIS — R29898 Other symptoms and signs involving the musculoskeletal system: Secondary | ICD-10-CM

## 2022-07-14 DIAGNOSIS — Z20822 Contact with and (suspected) exposure to covid-19: Secondary | ICD-10-CM | POA: Insufficient documentation

## 2022-07-14 DIAGNOSIS — R7402 Elevation of levels of lactic acid dehydrogenase (LDH): Secondary | ICD-10-CM | POA: Insufficient documentation

## 2022-07-14 DIAGNOSIS — N3000 Acute cystitis without hematuria: Secondary | ICD-10-CM | POA: Insufficient documentation

## 2022-07-14 LAB — CBC WITH DIFFERENTIAL/PLATELET
Abs Immature Granulocytes: 0.03 10*3/uL (ref 0.00–0.07)
Basophils Absolute: 0 10*3/uL (ref 0.0–0.1)
Basophils Relative: 0 %
Eosinophils Absolute: 0 10*3/uL (ref 0.0–0.5)
Eosinophils Relative: 0 %
HCT: 38.3 % (ref 36.0–46.0)
Hemoglobin: 13.6 g/dL (ref 12.0–15.0)
Immature Granulocytes: 0 %
Lymphocytes Relative: 11 %
Lymphs Abs: 1 10*3/uL (ref 0.7–4.0)
MCH: 34.9 pg — ABNORMAL HIGH (ref 26.0–34.0)
MCHC: 35.5 g/dL (ref 30.0–36.0)
MCV: 98.2 fL (ref 80.0–100.0)
Monocytes Absolute: 1.4 10*3/uL — ABNORMAL HIGH (ref 0.1–1.0)
Monocytes Relative: 15 %
Neutro Abs: 7 10*3/uL (ref 1.7–7.7)
Neutrophils Relative %: 74 %
Platelets: 179 10*3/uL (ref 150–400)
RBC: 3.9 MIL/uL (ref 3.87–5.11)
RDW: 12.8 % (ref 11.5–15.5)
WBC: 9.4 10*3/uL (ref 4.0–10.5)
nRBC: 0 % (ref 0.0–0.2)

## 2022-07-14 LAB — BASIC METABOLIC PANEL
Anion gap: 14 (ref 5–15)
BUN: 10 mg/dL (ref 6–20)
CO2: 22 mmol/L (ref 22–32)
Calcium: 8.7 mg/dL — ABNORMAL LOW (ref 8.9–10.3)
Chloride: 101 mmol/L (ref 98–111)
Creatinine, Ser: 0.8 mg/dL (ref 0.44–1.00)
GFR, Estimated: >60 mL/min (ref >60)
Glucose, Bld: 93 mg/dL (ref 70–99)
Potassium: 2.8 mmol/L — ABNORMAL LOW (ref 3.5–5.1)
Sodium: 137 mmol/L (ref 135–145)

## 2022-07-14 LAB — URINALYSIS, ROUTINE W REFLEX MICROSCOPIC
Bilirubin Urine: NEGATIVE
Glucose, UA: NEGATIVE mg/dL
Hgb urine dipstick: NEGATIVE
Ketones, ur: NEGATIVE mg/dL
Nitrite: POSITIVE — AB
Protein, ur: 100 mg/dL — AB
Specific Gravity, Urine: 1.015 (ref 1.005–1.030)
WBC, UA: >50 WBC/hpf (ref 0–5)
pH: 5 (ref 5.0–8.0)

## 2022-07-14 LAB — SARS CORONAVIRUS 2 BY RT PCR: SARS Coronavirus 2 by RT PCR: NEGATIVE

## 2022-07-14 LAB — LACTIC ACID, PLASMA: Lactic Acid, Venous: 2.1 mmol/L (ref 0.5–1.9)

## 2022-07-14 LAB — C-REACTIVE PROTEIN: CRP: 31 mg/dL — ABNORMAL HIGH (ref <1.0)

## 2022-07-14 LAB — SEDIMENTATION RATE: Sed Rate: 87 mm/hr — ABNORMAL HIGH (ref 0–22)

## 2022-07-14 LAB — HCG, QUANTITATIVE, PREGNANCY: hCG, Beta Chain, Quant, S: 1 m[IU]/mL (ref <5)

## 2022-07-14 MED ORDER — CEPHALEXIN 500 MG PO CAPS: 500.0000 mg | ORAL_CAPSULE | Freq: Three times a day (TID) | ORAL | 0 refills | Status: AC

## 2022-07-14 MED ORDER — GADOBUTROL 1 MMOL/ML IV SOLN
6.0000 mL | Freq: Once | INTRAVENOUS | Status: AC | PRN
  Administered 2022-07-14: 6 mL via INTRAVENOUS

## 2022-07-14 MED ORDER — POTASSIUM CHLORIDE CRYS ER 20 MEQ PO TBCR: 20.0000 meq | EXTENDED_RELEASE_TABLET | Freq: Two times a day (BID) | ORAL | 0 refills | Status: AC

## 2022-07-14 MED ORDER — LOPERAMIDE HCL 2 MG PO CAPS: 2.0000 mg | ORAL_CAPSULE | Freq: Four times a day (QID) | ORAL | 0 refills | Status: AC | PRN

## 2022-07-14 MED ORDER — POTASSIUM CHLORIDE 10 MEQ/100ML IV SOLN
10.0000 meq | INTRAVENOUS | Status: AC
  Administered 2022-07-14 (×2): 10 meq via INTRAVENOUS
  Filled 2022-07-14 (×2): qty 100

## 2022-07-14 MED ORDER — SODIUM CHLORIDE 0.9 % IV SOLN
1.0000 g | Freq: Once | INTRAVENOUS | Status: AC
  Administered 2022-07-14: 1 g via INTRAVENOUS
  Filled 2022-07-14: qty 10

## 2022-07-14 MED ORDER — ACETAMINOPHEN 325 MG PO TABS
650.0000 mg | ORAL_TABLET | Freq: Once | ORAL | Status: AC
  Administered 2022-07-14: 650 mg via ORAL
  Filled 2022-07-14: qty 2

## 2022-07-14 MED ORDER — ETODOLAC 400 MG PO TABS: 400.0000 mg | ORAL_TABLET | Freq: Two times a day (BID) | ORAL | 0 refills | Status: AC

## 2022-07-14 MED ORDER — FENTANYL CITRATE PF 50 MCG/ML IJ SOSY
50.0000 ug | PREFILLED_SYRINGE | Freq: Once | INTRAMUSCULAR | Status: AC
  Administered 2022-07-14: 50 ug via INTRAVENOUS
  Filled 2022-07-14: qty 1

## 2022-07-14 MED ORDER — ONDANSETRON HCL 4 MG/2ML IJ SOLN
4.0000 mg | Freq: Once | INTRAMUSCULAR | Status: AC
  Administered 2022-07-14: 4 mg via INTRAVENOUS
  Filled 2022-07-14: qty 2

## 2022-07-14 MED ORDER — LACTATED RINGERS IV BOLUS
1000.0000 mL | Freq: Once | INTRAVENOUS | Status: AC
  Administered 2022-07-14: 1000 mL via INTRAVENOUS

## 2022-07-14 MED ORDER — POTASSIUM CHLORIDE CRYS ER 20 MEQ PO TBCR
40.0000 meq | EXTENDED_RELEASE_TABLET | Freq: Once | ORAL | Status: AC
  Administered 2022-07-14: 40 meq via ORAL
  Filled 2022-07-14: qty 2

## 2022-07-14 MED ORDER — FLUCONAZOLE 150 MG PO TABS: 150.0000 mg | ORAL_TABLET | Freq: Every day | ORAL | 0 refills | Status: AC

## 2022-07-14 NOTE — ED Triage Notes (Addendum)
Pt coming in with complaints of " losing her ability in her leg in the left leg " after having a potential sacral fracture she was seen for in June. The leg does appear to have some mottling but pulses are present and cap refill is <3 seconds at the time of triage. No noticeable swelling in the leg but expresses and tingling sensation in leg that is not alleviated with positional changes. Mentions it is hard to walk on her leg and is interfering with he daily life.

## 2022-07-14 NOTE — Discharge Instructions (Signed)
Your fever is likely coming from a urinary tract infection.  Take the antibiotics as prescribed.  It is important you follow back up with a neurologist for further evaluation of your left leg pain and weakness.  Your MRI today did not show any acute problem to explain your weakness or fever. Return to the ED with worsening pain, weakness, numbness, tingling, bowel or bladder incontinence, any other concerns.

## 2022-07-14 NOTE — ED Provider Notes (Signed)
Patient initially seen by Dr. Manus Gunning.  Please see his note.  Patient has evidence of pyelonephritis on laboratory testing.  Patient also noted to be hypokalemic.  Patient was given antibiotics as well as a dose of potassium.  Plan was to follow-up on MRR testing to make sure there is no signs of infection spine or pelvis.  Patient does have evidence of neurologic deficit felt most likely to be chronic in nature but MRI was to assess for any acute abnormality.  Patient's MRIs did not show any acute findings in the spine or pelvis.  There is concern for possible pyelonephritis noted on the MRI which does correlate with her presentation.  Patient's fever has decreased.  Vital signs are stable.  Plan on discharge and outpatient management.   Linwood Dibbles, MD 07/14/22 [phone]

## 2022-07-14 NOTE — ED Provider Notes (Signed)
[place] EMERGENCY DEPARTMENT AT [facility] Provider Note   CSN: [id number] Arrival date & time: 07/14/22  0040     History  Chief Complaint  Patient presents with   Leg Injury    Beth Carr is a 27 y.o. female.  Patient reports pain and weakness to her left leg ever since she fell in April 2024.  At that time she had negative imaging except for possibly a sacral fracture  She reports she has had weakness in her left leg involving flexion and extension of her left foot for the past 4 months with increased pain extending up to her leg.  There is no change in the weakness to her foot or leg today but there is increased pain.  She is also had a fever for the past 2 days up to 104 at home.  Denies any pain with urination or blood in the urine.  Denies any chest pain or shortness of breath.  No cough or fever.  States she has had several episodes of bladder incontinence. States she has abused substances in the past but never injected any kind of drugs.  There is no change in her left foot drop or leg weakness or pain.  The numbness and tingling are the same.  The only thing that is new is the fever.  Denies any other source of fever or infectious symptoms.  Reviewed patient's chart been seen in the past for similar presentation, was initially seen on 04/21 after a fall, she was found down for 5 hours, patient had MRI of the thoracic and lumbar spine, both which were negative for acute findings, patient seen about a week later on the 28th endorsing worsening pain in the left hip and was unable to ambulate, CT scan was obtained showing questionable nondisplaced fracture of the left sacral ala, this was discussed with orthopedic surgery no emergent MRI is needed, keep her nonweightbearing and she should follow-up with orthopedics appears patient never followed up.  Patient was seen by neurology on 05/08 further concern for sciatic nerve damage versus lumbar sacral plexus injury and  she was ordered MRI pelvis as well as nerve testing.  patient was seen again on 05/18, with continued left leg pain, plain films of the left hip left foot and ankle are all negative acute findings.    Per neurology plan she was supposed to have an EMG as well as MRI of her pelvis which she has not yet done.  She reports the weakness and numbness to her left leg are unchanged.     The history is provided by the patient.       Home Medications Prior to Admission medications   Medication Sig Start Date End Date Taking? Authorizing Provider  acetaminophen (TYLENOL) 500 MG tablet Take 2 tablets (1,000 mg total) by mouth every 8 (eight) hours. Patient taking differently: Take 1,000 mg by mouth every 8 (eight) hours as needed for mild pain or headache. 08/27/19   Sparacino, Hailey L, DO  amphetamine-dextroamphetamine (ADDERALL) 30 MG tablet Take 30 mg by mouth 2 (two) times daily. 11/20/21   [provider]  Blood Pressure Monitoring (BLOOD PRESSURE KIT) DEVI 1 Device by Does not apply route as needed. 02/15/19   Anyanwu, Jethro Bastos, MD  DULoxetine (CYMBALTA) 30 MG capsule Take 1 capsule (30 mg total) by mouth daily. 05/12/22   Patel, Roxana Hires K, DO  gabapentin (NEURONTIN) 300 MG capsule Take 1 capsule (300 mg total) by mouth 3 (three)  times daily for 7 days. 06/15/22 06/22/22  Carroll Sage, PA-C  ibuprofen (ADVIL) 200 MG tablet Take 200-400 mg by mouth every 6 (six) hours as needed for mild pain or headache.    [provider]  SUBOXONE 8-2 MG FILM Place 1 Film under the tongue 3 (three) times daily. Patient not taking: Reported on 05/12/2022    [provider]  albuterol (PROVENTIL HFA;VENTOLIN HFA) 108 (90 BASE) MCG/ACT inhaler Inhale 2 puffs into the lungs every 4 (four) hours as needed for wheezing or shortness of breath. Patient not taking: Reported on 09/04/2019 12/19/14 01/17/20  Little, Ambrose Finland, MD  hydrochlorothiazide (HYDRODIURIL) 25 MG tablet Take 1 tablet (25  mg total) by mouth daily. 08/09/16 09/03/18  Shirley, [place], DO  omeprazole (PRILOSEC) 20 MG capsule Take 1 capsule (20 mg total) by mouth daily. Patient not taking: Reported on 09/04/2019 07/16/19 01/17/20  Marny Lowenstein, PA-C  sertraline (ZOLOFT) 50 MG tablet Take 1 tablet (50 mg total) by mouth daily. 10/16/19 01/17/20  Judeth Horn, NP      Allergies    Patient has no known allergies.    Review of Systems   Review of Systems  Constitutional:  Positive for fatigue and fever.  HENT:  Negative for congestion and rhinorrhea.   Respiratory:  Negative for cough, chest tightness and shortness of breath.   Gastrointestinal:  Negative for abdominal pain, nausea and vomiting.  Genitourinary:  Negative for dysuria, flank pain and hematuria.  Musculoskeletal:  Positive for arthralgias, back pain and myalgias.  Skin:  Negative for rash.  Neurological:  Positive for weakness and numbness. Negative for headaches. Light-headedness: .ro. Psychiatric/Behavioral:  Negative for agitation, confusion and suicidal ideas.    all other systems are negative except as noted in the HPI and PMH.    Physical Exam Updated Vital Signs BP (!) 145/95 (BP Location: Right Arm)   Pulse 96   Temp (!) 102.6 F (39.2 C) (Rectal)   Resp 17   Ht 5\' 3"  (1.6 m)   Wt 62.6 kg   SpO2 100%   BMI 24.45 kg/m  Physical Exam Vitals and nursing note reviewed.  Constitutional:      General: She is not in acute distress.    Appearance: She is well-developed.  HENT:     Head: Normocephalic and atraumatic.     Mouth/Throat:     Pharynx: No oropharyngeal exudate.  Eyes:     Conjunctiva/sclera: Conjunctivae normal.     Pupils: Pupils are equal, round, and reactive to light.  Neck:     Comments: No meningismus. Cardiovascular:     Rate and Rhythm: Normal rate and regular rhythm.     Heart sounds: Normal heart sounds. No murmur heard. Pulmonary:     Effort: Pulmonary effort is normal. No respiratory distress.     Breath  sounds: Normal breath sounds.  Abdominal:     Palpations: Abdomen is soft.     Tenderness: There is no abdominal tenderness. There is no guarding or rebound.  Musculoskeletal:        General: Tenderness present.     Cervical back: Normal range of motion and neck supple.  Skin:    General: Skin is warm.  Neurological:     Mental Status: She is alert and oriented to person, place, and time.     Cranial Nerves: No cranial nerve deficit.     Motor: No abnormal muscle tone.     Coordination: Coordination normal.  Comments: There is atrophy and mottling of the left lower leg with intact DP PT pulses.  4/5 strength of left hip and knee flexion and extension on the left. No strength with ankle flexion or extension on the left.  This is consistent with previous neurological exam in May.  5/5 strength on the right side.  Psychiatric:        Behavior: Behavior normal.     ED Results / Procedures / Treatments   Labs (all labs ordered are listed, but only abnormal results are displayed) Labs Reviewed  CBC WITH DIFFERENTIAL/PLATELET - Abnormal; Notable for the following components:      Result Value   MCH 34.9 (*)    Monocytes Absolute 1.4 (*)    All other components within normal limits  BASIC METABOLIC PANEL - Abnormal; Notable for the following components:   Potassium 2.8 (*)    Calcium 8.7 (*)    All other components within normal limits  URINALYSIS, ROUTINE W REFLEX MICROSCOPIC - Abnormal; Notable for the following components:   Color, Urine AMBER (*)    APPearance HAZY (*)    Protein, ur 100 (*)    Nitrite POSITIVE (*)    Leukocytes,Ua LARGE (*)    Bacteria, UA MANY (*)    All other components within normal limits  LACTIC ACID, PLASMA - Abnormal; Notable for the following components:   Lactic Acid, Venous 2.1 (*)    All other components within normal limits  SEDIMENTATION RATE - Abnormal; Notable for the following components:   Sed Rate 87 (*)    All other components  within normal limits  C-REACTIVE PROTEIN - Abnormal; Notable for the following components:   CRP 31.0 (*)    All other components within normal limits  SARS CORONAVIRUS 2 BY RT PCR  CULTURE, BLOOD (ROUTINE X 2)  CULTURE, BLOOD (ROUTINE X 2)  HCG, QUANTITATIVE, PREGNANCY  LACTIC ACID, PLASMA    EKG None  Radiology DG Chest 2 View  Result Date: 07/14/2022 CLINICAL DATA:  27 year old female with fever.  Chronic leg injury. EXAM: CHEST - 2 VIEW COMPARISON:  Portable chest 04/26/2022 and earlier. FINDINGS: PA and lateral views at 0459 hours. Normal lung volumes and mediastinal contours. Visualized tracheal air column is within normal limits. Lung markings are within normal limits, both lungs appear clear. No pneumothorax or pleural effusion. Gas in nondilated stomach. No osseous abnormality identified. IMPRESSION: Negative.  No cardiopulmonary abnormality. Electronically Signed   By: Odessa Fleming M.D.   On: 07/14/2022 05:20    Procedures Procedures    Medications Ordered in ED Medications - No data to display  ED Course/ Medical Decision Making/ A&P                             Medical Decision Making Amount and/or Complexity of Data Reviewed Labs: ordered. Decision-making details documented in ED Course. Radiology: ordered and independent interpretation performed. Decision-making details documented in ED Course. ECG/medicine tests: ordered and independent interpretation performed. Decision-making details documented in ED Course.  Risk OTC drugs. Prescription drug management.  Ongoing left leg weakness and numbness for the past 4 months since an injury.  Has followed up with neurology and has concern for possible lumbar plexus injury.  Pain and numbness and weakness are the same but has new fever in the past 3 days with episodes of urinary incontinence.  MRI will be obtained to evaluate for spinal cord pathology or infection. Blood  and urine cultures to be sent.  D/w Dr. Wilford Corner who  agrees with MRI but likely not emergent as her exam is unchanged.  Her fever is new however but there is low concern for possible spinal infection.  Patient febrile in the ED to 102.6.  Infectious workup pursued.  Blood cultures and urine culture obtained.  Lactate is mildly elevated at 2.0.  with fever, incontinence, and underlying low back pain with left leg weakness there is some concern for possible spinal infection.  However her weakness and pain are unchanged.  Fever is new.  Does have evidence of urinary tract infection likely explains her fever and inflammatory marker elevation.  MRI will be pursued to rule out spinal infection such as epidural abscess, discitis or osteomyelitis.  Patient given Rocephin for suspected UTI.  Cultures are pending.  Does not appear to be toxic or septic.  MRI pending at shift change.  Care transferred to oncoming team        Final Clinical Impression(s) / ED Diagnoses Final diagnoses:  Left leg weakness  Acute cystitis without hematuria    Rx / DC Orders ED Discharge Orders     None         Wiley Magan, Jeannett Senior, MD 07/14/22 [id number]

## 2022-07-14 NOTE — ED Notes (Signed)
Pt also wants a std test.

## 2022-07-14 NOTE — ED Notes (Signed)
Pt in MRI.

## 2022-07-19 LAB — CULTURE, BLOOD (ROUTINE X 2)
Culture: NO GROWTH
Culture: NO GROWTH
Special Requests: ADEQUATE

## 2022-07-24 ENCOUNTER — Inpatient Hospital Stay: Admission: RE | Admit: 2022-07-24 | Payer: Medicaid Other | Source: Ambulatory Visit

## 2022-08-02 ENCOUNTER — Ambulatory Visit: Payer: Medicaid Other | Admitting: Neurology

## 2022-08-02 ENCOUNTER — Encounter: Payer: Self-pay | Admitting: Neurology

## 2022-08-05 ENCOUNTER — Ambulatory Visit: Payer: MEDICAID | Attending: Neurology | Admitting: Physical Therapy

## 2022-08-20 ENCOUNTER — Ambulatory Visit: Payer: MEDICAID | Admitting: Physical Therapy

## 2023-02-10 ENCOUNTER — Ambulatory Visit: Payer: MEDICAID | Admitting: Podiatry

## 2023-04-05 ENCOUNTER — Ambulatory Visit: Payer: MEDICAID | Admitting: Podiatry

## 2023-10-07 ENCOUNTER — Ambulatory Visit: Payer: MEDICAID | Admitting: Podiatry

## 2023-10-13 ENCOUNTER — Encounter: Payer: Self-pay | Admitting: Podiatry

## 2023-10-13 ENCOUNTER — Ambulatory Visit (INDEPENDENT_AMBULATORY_CARE_PROVIDER_SITE_OTHER): Payer: MEDICAID | Admitting: Podiatry

## 2023-10-13 ENCOUNTER — Ambulatory Visit (INDEPENDENT_AMBULATORY_CARE_PROVIDER_SITE_OTHER): Payer: MEDICAID

## 2023-10-13 VITALS — Ht 63.0 in | Wt 138.0 lb

## 2023-10-13 DIAGNOSIS — M216X2 Other acquired deformities of left foot: Secondary | ICD-10-CM | POA: Diagnosis not present

## 2023-10-13 DIAGNOSIS — M7752 Other enthesopathy of left foot: Secondary | ICD-10-CM | POA: Diagnosis not present

## 2023-10-13 DIAGNOSIS — M792 Neuralgia and neuritis, unspecified: Secondary | ICD-10-CM | POA: Diagnosis not present

## 2023-10-13 DIAGNOSIS — M24572 Contracture, left ankle: Secondary | ICD-10-CM

## 2023-10-13 NOTE — Progress Notes (Signed)
 Subjective:  Patient ID: Beth Carr, female    DOB: [birth year],  MRN: [id number]  Chief Complaint  Patient presents with   Foot Pain    Pt is here due to left foot and ankle pain, states she hurt the foot and ankle last year, and has been bothering her since, states the pain in the whole foot and ankle, states its hard for her to press her foot completely down while walking, and that it feels like a 100 rubber bands are around her ankle.    Discussed the use of AI scribe software for clinical note transcription with the patient, who gave verbal consent to proceed.  History of Present Illness Beth Carr is a 28 year old female who presents with chronic right foot and ankle pain and weakness following a fall. She was previously seen by Dr. Tobie, a neurologist, for further evaluation of her foot and ankle issues.  At that time a nerve conduction test was recommended but the patient was nervous to have this done.  In May of last year, she fell in her bathroom, trapping her foot in an awkward position for about three hours. This led to significant swelling and numbness in her right leg and foot, causing difficulty walking without falling. X-rays were normal, but symptoms have persisted.  Current symptoms include inability to lift her foot, a sensation of tightness in her leg, and swelling in her toes after prolonged standing. She describes her foot as feeling like it is about to explode and can only walk on a specific part of her foot. She cannot force her foot down and experiences tingling when pressure is applied.  She has not undergone a nerve conduction test due to apprehension about the procedure. Over the past year, pain has slightly improved, but significant discomfort and functional limitations remain.      Objective:  There were no vitals filed for this visit.  Physical Exam General: AAO x3, NAD  Dermatological: Skin is warm, dry and supple bilateral.  There are no open  sores, no preulcerative lesions, no rash or signs of infection present.  Vascular: Dorsalis Pedis artery and Posterior Tibial artery pedal pulses are 2/4 bilateral with immedate capillary fill time.  There is no pain with calf compression, swelling, warmth, erythema.   Neruologic: Sensation decreased to the left lower extremity.   Musculoskeletal: Equinus contracture present.  It is difficult to get her ankle to the degrees.  Diffuse discomfort along the left lower extremity the left side.  Gait: Unassisted, Nonantalgic.     No images are attached to the encounter.    Results RADIOLOGY Ankle X-ray: Multiple views obtained.  No evidence of acute fracture noted today.  Ankle joint space maintained.  Equinus contracture present.   Assessment:   1. Equinus deformity of left foot   2. Capsulitis of ankle, left   3. Ankle contracture, left      Plan:  Patient was evaluated and treated and all questions answered.  Assessment and Plan Assessment & Plan Left foot and ankle weakness and sensory changes, possible nerve injury Chronic weakness and sensory changes suggest possible nerve or muscle injury. Differential includes nerve injury and muscle damage. Diagnosis confirmation needed to prevent long-term complications. - Recommended follow-up with Dr. Tobie to discuss nerve conduction test scheduling. - MRI left ankle ordered. - Coordinate with [facility] for nerve conduction test scheduling. - Advised her to contact [facility] if not contacted    Return for nerve conduction  test and MRI results.   Donnice JONELLE Fees DPM

## 2023-10-25 ENCOUNTER — Encounter: Payer: Self-pay | Admitting: Podiatry

## 2023-10-31 ENCOUNTER — Telehealth: Payer: Self-pay | Admitting: Podiatry

## 2023-10-31 NOTE — Telephone Encounter (Signed)
 Patients MRI  has been cancelled because insurance is still pending.

## 2023-11-01 ENCOUNTER — Other Ambulatory Visit: Payer: MEDICAID

## 2023-11-02 ENCOUNTER — Telehealth: Payer: Self-pay

## 2023-11-02 NOTE — Telephone Encounter (Signed)
 MRI PA has not received a final determination yet (possible denial),but additional information documenting 4 weeks of therapy with no mprovement within the last 6 months. A P2P can be done by calling   [phone] and referencing TRACKING # [id number]

## 2023-11-07 NOTE — Telephone Encounter (Signed)
 MRI was denied due to no documentation of 4 weeks of exercises within the past 6 months.

## 2023-11-15 ENCOUNTER — Ambulatory Visit: Payer: MEDICAID | Admitting: Neurology

## 2023-11-15 ENCOUNTER — Encounter: Payer: Self-pay | Admitting: Neurology

## 2023-12-27 ENCOUNTER — Telehealth (HOSPITAL_COMMUNITY): Payer: Self-pay | Admitting: Licensed Clinical Social Worker

## 2023-12-27 NOTE — Telephone Encounter (Signed)
 Cln returned call/VM left by pt stating she is interested in IOP services and was referred by probation and DSS. Call rang and message states VM box is not set up.

## 2023-12-30 ENCOUNTER — Telehealth (HOSPITAL_COMMUNITY): Payer: Self-pay | Admitting: Licensed Clinical Social Worker

## 2023-12-30 NOTE — Telephone Encounter (Signed)
 See call log

## 2024-01-06 ENCOUNTER — Telehealth (HOSPITAL_COMMUNITY): Payer: Self-pay | Admitting: Professional

## 2024-01-06 NOTE — Telephone Encounter (Signed)
"  See call log  "
